# Patient Record
Sex: Female | Born: 1935 | Race: White | Hispanic: No | Smoking: Never smoker
Health system: Southern US, Community
[De-identification: ages and names within clinical notes are randomized; demographics above are authoritative.]

## PROBLEM LIST (undated history)

## (undated) DIAGNOSIS — F419 Anxiety disorder, unspecified: Secondary | ICD-10-CM

## (undated) DIAGNOSIS — R739 Hyperglycemia, unspecified: Secondary | ICD-10-CM

## (undated) DIAGNOSIS — I10 Essential (primary) hypertension: Secondary | ICD-10-CM

## (undated) DIAGNOSIS — M810 Age-related osteoporosis without current pathological fracture: Secondary | ICD-10-CM

## (undated) DIAGNOSIS — S72001A Fracture of unspecified part of neck of right femur, initial encounter for closed fracture: Secondary | ICD-10-CM

## (undated) HISTORY — PX: COLON SURGERY: SHX602

## (undated) HISTORY — PX: CHOLECYSTECTOMY: SHX55

---

## 1997-11-22 ENCOUNTER — Other Ambulatory Visit: Admission: RE | Admit: 1997-11-22 | Discharge: 1997-11-22 | Payer: Self-pay | Admitting: Family Medicine

## 1998-11-26 ENCOUNTER — Other Ambulatory Visit: Admission: RE | Admit: 1998-11-26 | Discharge: 1998-11-26 | Payer: Self-pay | Admitting: Family Medicine

## 1999-01-09 ENCOUNTER — Encounter: Payer: Self-pay | Admitting: General Surgery

## 1999-01-09 ENCOUNTER — Ambulatory Visit (HOSPITAL_COMMUNITY): Admission: RE | Admit: 1999-01-09 | Discharge: 1999-01-09 | Payer: Self-pay | Admitting: General Surgery

## 1999-01-25 ENCOUNTER — Encounter: Payer: Self-pay | Admitting: General Surgery

## 1999-01-28 ENCOUNTER — Ambulatory Visit (HOSPITAL_COMMUNITY): Admission: RE | Admit: 1999-01-28 | Discharge: 1999-01-29 | Payer: Self-pay | Admitting: General Surgery

## 1999-06-19 ENCOUNTER — Encounter (INDEPENDENT_AMBULATORY_CARE_PROVIDER_SITE_OTHER): Payer: Self-pay | Admitting: *Deleted

## 1999-06-19 ENCOUNTER — Ambulatory Visit (HOSPITAL_COMMUNITY): Admission: RE | Admit: 1999-06-19 | Discharge: 1999-06-19 | Payer: Self-pay | Admitting: Gastroenterology

## 1999-10-08 ENCOUNTER — Ambulatory Visit (HOSPITAL_COMMUNITY): Admission: RE | Admit: 1999-10-08 | Discharge: 1999-10-08 | Payer: Self-pay | Admitting: Gastroenterology

## 1999-10-08 ENCOUNTER — Encounter: Payer: Self-pay | Admitting: Gastroenterology

## 1999-11-27 ENCOUNTER — Other Ambulatory Visit: Admission: RE | Admit: 1999-11-27 | Discharge: 1999-11-27 | Payer: Self-pay | Admitting: *Deleted

## 2000-12-02 ENCOUNTER — Other Ambulatory Visit: Admission: RE | Admit: 2000-12-02 | Discharge: 2000-12-02 | Payer: Self-pay | Admitting: Family Medicine

## 2001-12-08 ENCOUNTER — Other Ambulatory Visit: Admission: RE | Admit: 2001-12-08 | Discharge: 2001-12-08 | Payer: Self-pay | Admitting: Family Medicine

## 2002-05-11 ENCOUNTER — Ambulatory Visit (HOSPITAL_BASED_OUTPATIENT_CLINIC_OR_DEPARTMENT_OTHER): Admission: RE | Admit: 2002-05-11 | Discharge: 2002-05-11 | Payer: Self-pay | Admitting: Plastic Surgery

## 2004-01-24 ENCOUNTER — Other Ambulatory Visit: Admission: RE | Admit: 2004-01-24 | Discharge: 2004-01-24 | Payer: Self-pay | Admitting: Family Medicine

## 2005-02-05 ENCOUNTER — Other Ambulatory Visit: Admission: RE | Admit: 2005-02-05 | Discharge: 2005-02-05 | Payer: Self-pay | Admitting: Family Medicine

## 2006-01-09 ENCOUNTER — Encounter (INDEPENDENT_AMBULATORY_CARE_PROVIDER_SITE_OTHER): Payer: Self-pay | Admitting: Specialist

## 2006-01-09 ENCOUNTER — Inpatient Hospital Stay (HOSPITAL_COMMUNITY): Admission: RE | Admit: 2006-01-09 | Discharge: 2006-01-15 | Payer: Self-pay

## 2007-11-28 ENCOUNTER — Emergency Department (HOSPITAL_COMMUNITY): Admission: EM | Admit: 2007-11-28 | Discharge: 2007-11-28 | Payer: Self-pay | Admitting: Emergency Medicine

## 2009-09-21 ENCOUNTER — Encounter: Admission: RE | Admit: 2009-09-21 | Discharge: 2009-09-21 | Payer: Self-pay | Admitting: *Deleted

## 2009-09-26 ENCOUNTER — Encounter: Admission: RE | Admit: 2009-09-26 | Discharge: 2009-09-26 | Payer: Self-pay | Admitting: *Deleted

## 2010-09-18 ENCOUNTER — Other Ambulatory Visit: Payer: Self-pay | Admitting: Dermatology

## 2010-11-29 NOTE — Op Note (Signed)
NAME:  Jessica Bruce, Jessica Bruce             ACCOUNT NO.:  [id number]   MEDICAL RECORD NO.:  [id number]          PATIENT TYPE:  INP   LOCATION:  0001                         FACILITY:  [facility]   PHYSICIAN:  Lebron Conners, M.D.   DATE OF BIRTH:  [birth year]   DATE OF PROCEDURE:  01/09/2006  DATE OF DISCHARGE:                                 OPERATIVE REPORT   PREOPERATIVE DIAGNOSIS:  Tumor of the rectosigmoid.   POSTOPERATIVE DIAGNOSIS:  Tumor of the rectosigmoid.   OPERATION:  Low anterior resection of the rectosigmoid with stapled  anastomosis.   SURGEON:  Lebron Conners, M.D.   ASSISTANT:  Ollen Gross. Carolynne Edouard, M.D.   ANESTHESIA:  General.   COMPLICATIONS:  None.   SPECIMEN:  Segment of colon.   BLOOD LOSS:  About 100 mL.   CONDITION:  To PACU:  Good.   PROCEDURE:  Following induction of anesthesia and insertion of a Foley  catheter and routine preparation and draping of the abdomen and peroneum  with the patient placed in stirrups, I made a midline lower abdominal  incision and entered the peritoneal cavity easily.  There was no evidence of  any peritoneal metastatic disease.  I examined the liver and it was normal  except for one tiny nodule on the surface of the left lobe, which on  examination, appeared to be cystic.  I felt the sigmoid colon distally and  could feel what felt like a soft tumor to me just above the peritoneal  reflection of the upper rectum.  I felt no other intra-abdominal  abnormalities except that I saw a small left ovarian cyst which appeared  totally benign.  This cyst was ruptured during the retraction and dissection  of the case.  I then packed away the small bowel and mobilized the sigmoid  colon, cutting a little bit of the attachment laterally of the distal  descending colon and then swinging the sigmoid colon medially.  I dissected  out and identified the left ureter and was sure that I did no harm to it.  I  divided the proximal area of resection with  cutting stapler at the mid  sigmoid and then dissected down through the mesentery toward the sacral  promontory and ligated and divided vessels using 2-0 silks and divided what  I thought was the inferior mesenteric artery.  I then scored the peritoneum  laterally on each side and took the dissection down along the anterior  surface of the sacrum and coccyx down into the pelvis, gaining good  mobilization.  I primarily used the harmonic scalpel for the dissection and  hemostasis after I had first divided the mesentery and ligated the vessels  with 2-0 silks.  I scored the peritoneum in the cul-de-sac, dissected right  down to the anterior surface of the bowel and I was sure I was distal to the  tumor.  I freed mesentery up all the way around and right up to the bowel  and then divided the bowel distally with the contour cutting stapler and  made a nice division.  I then opened the bowel and saw  that there was a  large soft tumor well proximal to the line of resection.  I pulled the  proximal bowel down and felt I needed a little bit more mobility and so I  dissected further along the left gutter and got excellent mobility without  dividing any more of the mesentery.  I again tried putting the bowel down in  the pelvis and it lay there without any tension at all.  Hemostasis was good  in the left gutter.  I opened the end of the bowel and utilized the sizers  and felt that I found that I could anastomose around 25 mm stapler, but it  would not accept a 29 mm.  I put a 2-0 Prolene pursestring suture and put  the anvil within the proximal bowel.  Dr. Carolynne Edouard went down and put the stapler  in through the anus and brought it up to the area of the distal staple line  under direct vision, guided the post through and connected the anvil and we  did an end-to-end anastomosis utilizing the round stapler.  With the stapler  removed, two nice complete rings of tissue, which we inspected and I sent  the  one from the distal bowel was the distal margin.  I then filled the  pelvis with fluid and Dr. Carolynne Edouard distended the bowel and there was no leak of  air.  He visualized the staple line and said it looked very good.  There was  no bleeding.  I then removed the irrigant and made sure of hemostasis in the  pelvis.  Sponge, needle and instrument counts were correct.  I closed the  fascia with running #1 PDS and I closed the skin with staples after  irrigating the subcutaneous tissues.  The patient was stable through the  procedure.      Lebron Conners, M.D.  Electronically Signed     WB/MEDQ  D:  01/09/2006  T:  01/09/2006  Job:  [id number]   cc:   Everardo All. Madilyn Fireman, M.D.  Fax: [phone]   Chales Salmon. Abigail Miyamoto, M.D.  Fax: [phone]

## 2010-11-29 NOTE — Discharge Summary (Signed)
NAME:  Jessica Bruce, Jessica Bruce             ACCOUNT NO.:  [id number]   MEDICAL RECORD NO.:  [id number]          PATIENT TYPE:  INP   LOCATION:  1611                         FACILITY:  [facility]   PHYSICIAN:  Lebron Conners, M.D.   DATE OF BIRTH:  1923-01-12   DATE OF ADMISSION:  01/09/2006  DATE OF DISCHARGE:  01/15/2006                                 DISCHARGE SUMMARY   HISTORY:  Ms. Boulay is a 77 year old female who was found on screening  colonoscopy to have a number of small polyps and a large polyp at 18 cm from  the anus.  It was not resectable and had high-grade dysplasia on biopsy.  She was referred for surgery.  She took an outpatient prep and did well with  it.  She was admitted for surgery.  Her only medical problem is  hypertension.  Examination was unremarkable.   HOSPITAL COURSE:  The patient underwent a low anterior resection of the  rectosigmoid on the admission date January 09, 2006.  Postoperatively, she had  no unusual problems and had rapid return of gastrointestinal function.  By  January 15, 2006, her bowels were moving.  She was eating well.  Wound was well-  healed and staples were removed.  She was sent home and asked to return to  the office in 2-3 weeks.  Pathology showed a tubulovillous adenoma with  focal high-grade dysplasia but no malignancy was identified.  There were 14  lymph nodes present and all were benign.   DIAGNOSES:  1.  Tubulovillous adenoma of the rectosigmoid.  2.  Hypertension.  3.  Type 2 diabetes.   OPERATION:  Low anterior resection of the rectosigmoid.   DISCHARGE CONDITION:  Improved.      Lebron Conners, M.D.  Electronically Signed     WB/MEDQ  D:  02/02/2006  T:  02/02/2006  Job:  [id number]   cc:   Chales Salmon. Abigail Miyamoto, M.D.  Fax: [phone]   Veva Holes, M.D.

## 2010-11-29 NOTE — Procedures (Signed)
[place]  Patient:    Jessica Bruce                       MRN: [id number] Proc. Date: 06/19/99 Adm. Date:  [id number] Attending:  Orland Mustard CC:         Chales Salmon. Abigail Miyamoto, M.D.                           Procedure Report  PROCEDURE:  Esophagogastroduodenoscopy and polypectomy.  ENDOSCOPIST:  Llana Aliment. Edwards, M.D.  MEDICATIONS:  Hurricaine spray and fentanyl 50 mcg, Versed 8 mg IV.  INDICATIONS:  The patient is a nice woman who has had a previous cholecystectomy and a normal hepatobiliary scan.  Upper GI showed gastric polyps.  This procedure is done as a followup to the abnormal upper GI to evaluate his gastric polyps.  INFORMED CONSENT:  The procedure had been explained to the patient and consent obtained.  DESCRIPTION OF PROCEDURE:  With the patient in the left lateral decubitus position, the Olympus video endoscope was inserted blindly into the esophagus and advanced under direct visualization. The stomach was entered, pylorus identified and passed. Duodenum including bulb and second portion unremarkable.  The scope was withdrawn back into the stomach, antrum and body were normal.  Behind the fundus were multiple small polyps, the largest approximately 0.75 cm, multiple small ones of 2-3 mm.  There were at least 10-20 polyps.  There were four or five that were 0.5 cm large.  These were all removed with a snare.  The majority were sucked through the scope, others were recovered with a basket and all sent to pathology in a single jar.  There was no continuous bleeding in either polypectomy sites. The scope was withdrawn and distal and proximal esophagus were normal.  The patient tolerated the procedure well and maintained on low-flow oxygen and pulse oximetry throughout the procedure with no obvious problem.  ASSESSMENT:  Multiple gastric polyps, removed.  PLAN:  We will check pathology, continue patient on Axid and see  back in the office in January. DD:  06/19/99 TD:  06/20/99 Job: [id number] UXL/[id number]

## 2011-03-06 ENCOUNTER — Other Ambulatory Visit: Payer: Self-pay | Admitting: Dermatology

## 2011-04-09 LAB — DIFFERENTIAL
Basophils Absolute: 0
Eosinophils Absolute: 0.2
Eosinophils Relative: 3
Lymphocytes Relative: 30
Neutrophils Relative %: 61

## 2011-04-09 LAB — POCT I-STAT, CHEM 8
BUN: 21
Hemoglobin: 13.9
Potassium: 4.4
Sodium: 136
TCO2: 32

## 2011-04-09 LAB — URINALYSIS, ROUTINE W REFLEX MICROSCOPIC
Protein, ur: NEGATIVE
Urobilinogen, UA: 0.2

## 2011-04-09 LAB — CBC
HCT: 41.2
Platelets: 148 — ABNORMAL LOW
RDW: 13

## 2011-04-09 LAB — URINE CULTURE

## 2011-04-09 LAB — URINE MICROSCOPIC-ADD ON

## 2011-07-21 DIAGNOSIS — M1990 Unspecified osteoarthritis, unspecified site: Secondary | ICD-10-CM | POA: Diagnosis not present

## 2011-07-21 DIAGNOSIS — I10 Essential (primary) hypertension: Secondary | ICD-10-CM | POA: Diagnosis not present

## 2011-07-23 DIAGNOSIS — I10 Essential (primary) hypertension: Secondary | ICD-10-CM | POA: Diagnosis not present

## 2011-07-23 DIAGNOSIS — M899 Disorder of bone, unspecified: Secondary | ICD-10-CM | POA: Diagnosis not present

## 2011-07-23 DIAGNOSIS — M949 Disorder of cartilage, unspecified: Secondary | ICD-10-CM | POA: Diagnosis not present

## 2011-07-30 DIAGNOSIS — H26499 Other secondary cataract, unspecified eye: Secondary | ICD-10-CM | POA: Diagnosis not present

## 2011-08-27 DIAGNOSIS — M949 Disorder of cartilage, unspecified: Secondary | ICD-10-CM | POA: Diagnosis not present

## 2011-08-27 DIAGNOSIS — M899 Disorder of bone, unspecified: Secondary | ICD-10-CM | POA: Diagnosis not present

## 2011-09-03 DIAGNOSIS — L570 Actinic keratosis: Secondary | ICD-10-CM | POA: Diagnosis not present

## 2011-09-03 DIAGNOSIS — L578 Other skin changes due to chronic exposure to nonionizing radiation: Secondary | ICD-10-CM | POA: Diagnosis not present

## 2011-09-03 DIAGNOSIS — L821 Other seborrheic keratosis: Secondary | ICD-10-CM | POA: Diagnosis not present

## 2011-09-03 DIAGNOSIS — D239 Other benign neoplasm of skin, unspecified: Secondary | ICD-10-CM | POA: Diagnosis not present

## 2011-11-05 DIAGNOSIS — E782 Mixed hyperlipidemia: Secondary | ICD-10-CM | POA: Diagnosis not present

## 2011-11-05 DIAGNOSIS — M810 Age-related osteoporosis without current pathological fracture: Secondary | ICD-10-CM | POA: Diagnosis not present

## 2011-11-05 DIAGNOSIS — Z1331 Encounter for screening for depression: Secondary | ICD-10-CM | POA: Diagnosis not present

## 2011-11-05 DIAGNOSIS — Z0000 Encounter for general adult medical examination without abnormal findings: Secondary | ICD-10-CM | POA: Diagnosis not present

## 2011-11-05 DIAGNOSIS — F411 Generalized anxiety disorder: Secondary | ICD-10-CM | POA: Diagnosis not present

## 2011-11-05 DIAGNOSIS — I10 Essential (primary) hypertension: Secondary | ICD-10-CM | POA: Diagnosis not present

## 2012-04-14 ENCOUNTER — Other Ambulatory Visit: Payer: Self-pay | Admitting: Dermatology

## 2012-04-14 DIAGNOSIS — D485 Neoplasm of uncertain behavior of skin: Secondary | ICD-10-CM | POA: Diagnosis not present

## 2012-04-14 DIAGNOSIS — L821 Other seborrheic keratosis: Secondary | ICD-10-CM | POA: Diagnosis not present

## 2012-04-14 DIAGNOSIS — C44721 Squamous cell carcinoma of skin of unspecified lower limb, including hip: Secondary | ICD-10-CM | POA: Diagnosis not present

## 2012-04-14 DIAGNOSIS — L820 Inflamed seborrheic keratosis: Secondary | ICD-10-CM | POA: Diagnosis not present

## 2012-05-05 DIAGNOSIS — R7309 Other abnormal glucose: Secondary | ICD-10-CM | POA: Diagnosis not present

## 2012-05-05 DIAGNOSIS — E782 Mixed hyperlipidemia: Secondary | ICD-10-CM | POA: Diagnosis not present

## 2012-05-05 DIAGNOSIS — Z23 Encounter for immunization: Secondary | ICD-10-CM | POA: Diagnosis not present

## 2012-05-05 DIAGNOSIS — M810 Age-related osteoporosis without current pathological fracture: Secondary | ICD-10-CM | POA: Diagnosis not present

## 2012-05-05 DIAGNOSIS — I10 Essential (primary) hypertension: Secondary | ICD-10-CM | POA: Diagnosis not present

## 2012-05-05 DIAGNOSIS — F411 Generalized anxiety disorder: Secondary | ICD-10-CM | POA: Diagnosis not present

## 2012-05-06 ENCOUNTER — Other Ambulatory Visit: Payer: Self-pay | Admitting: Dermatology

## 2012-05-06 DIAGNOSIS — C44721 Squamous cell carcinoma of skin of unspecified lower limb, including hip: Secondary | ICD-10-CM | POA: Diagnosis not present

## 2012-06-07 DIAGNOSIS — Z23 Encounter for immunization: Secondary | ICD-10-CM | POA: Diagnosis not present

## 2012-08-16 DIAGNOSIS — E1139 Type 2 diabetes mellitus with other diabetic ophthalmic complication: Secondary | ICD-10-CM | POA: Diagnosis not present

## 2012-09-08 DIAGNOSIS — L821 Other seborrheic keratosis: Secondary | ICD-10-CM | POA: Diagnosis not present

## 2012-09-08 DIAGNOSIS — Z85828 Personal history of other malignant neoplasm of skin: Secondary | ICD-10-CM | POA: Diagnosis not present

## 2012-09-08 DIAGNOSIS — L820 Inflamed seborrheic keratosis: Secondary | ICD-10-CM | POA: Diagnosis not present

## 2012-09-08 DIAGNOSIS — L819 Disorder of pigmentation, unspecified: Secondary | ICD-10-CM | POA: Diagnosis not present

## 2012-09-08 DIAGNOSIS — D1739 Benign lipomatous neoplasm of skin and subcutaneous tissue of other sites: Secondary | ICD-10-CM | POA: Diagnosis not present

## 2012-09-08 DIAGNOSIS — L570 Actinic keratosis: Secondary | ICD-10-CM | POA: Diagnosis not present

## 2012-09-08 DIAGNOSIS — L739 Follicular disorder, unspecified: Secondary | ICD-10-CM | POA: Diagnosis not present

## 2012-10-11 DIAGNOSIS — M5430 Sciatica, unspecified side: Secondary | ICD-10-CM | POA: Diagnosis not present

## 2012-10-14 DIAGNOSIS — Z85828 Personal history of other malignant neoplasm of skin: Secondary | ICD-10-CM | POA: Diagnosis not present

## 2012-10-14 DIAGNOSIS — L570 Actinic keratosis: Secondary | ICD-10-CM | POA: Diagnosis not present

## 2012-10-14 DIAGNOSIS — L820 Inflamed seborrheic keratosis: Secondary | ICD-10-CM | POA: Diagnosis not present

## 2012-12-31 DIAGNOSIS — Z1331 Encounter for screening for depression: Secondary | ICD-10-CM | POA: Diagnosis not present

## 2012-12-31 DIAGNOSIS — I10 Essential (primary) hypertension: Secondary | ICD-10-CM | POA: Diagnosis not present

## 2013-02-02 DIAGNOSIS — H6120 Impacted cerumen, unspecified ear: Secondary | ICD-10-CM | POA: Diagnosis not present

## 2013-03-28 DIAGNOSIS — R42 Dizziness and giddiness: Secondary | ICD-10-CM | POA: Diagnosis not present

## 2013-03-28 DIAGNOSIS — I10 Essential (primary) hypertension: Secondary | ICD-10-CM | POA: Diagnosis not present

## 2013-03-28 DIAGNOSIS — R000 Tachycardia, unspecified: Secondary | ICD-10-CM | POA: Diagnosis not present

## 2013-03-30 DIAGNOSIS — F411 Generalized anxiety disorder: Secondary | ICD-10-CM | POA: Diagnosis not present

## 2013-03-30 DIAGNOSIS — I10 Essential (primary) hypertension: Secondary | ICD-10-CM | POA: Diagnosis not present

## 2013-03-30 DIAGNOSIS — M810 Age-related osteoporosis without current pathological fracture: Secondary | ICD-10-CM | POA: Diagnosis not present

## 2013-04-06 DIAGNOSIS — I10 Essential (primary) hypertension: Secondary | ICD-10-CM | POA: Diagnosis not present

## 2013-04-11 DIAGNOSIS — I10 Essential (primary) hypertension: Secondary | ICD-10-CM | POA: Diagnosis not present

## 2013-04-11 DIAGNOSIS — F411 Generalized anxiety disorder: Secondary | ICD-10-CM | POA: Diagnosis not present

## 2013-04-20 DIAGNOSIS — Z23 Encounter for immunization: Secondary | ICD-10-CM | POA: Diagnosis not present

## 2013-08-03 DIAGNOSIS — E119 Type 2 diabetes mellitus without complications: Secondary | ICD-10-CM | POA: Diagnosis not present

## 2013-08-03 DIAGNOSIS — Z0100 Encounter for examination of eyes and vision without abnormal findings: Secondary | ICD-10-CM | POA: Diagnosis not present

## 2013-08-04 DIAGNOSIS — R7301 Impaired fasting glucose: Secondary | ICD-10-CM | POA: Diagnosis not present

## 2013-08-04 DIAGNOSIS — I10 Essential (primary) hypertension: Secondary | ICD-10-CM | POA: Diagnosis not present

## 2013-08-04 DIAGNOSIS — H6120 Impacted cerumen, unspecified ear: Secondary | ICD-10-CM | POA: Diagnosis not present

## 2013-08-04 DIAGNOSIS — R42 Dizziness and giddiness: Secondary | ICD-10-CM | POA: Diagnosis not present

## 2013-08-21 ENCOUNTER — Emergency Department (HOSPITAL_COMMUNITY): Payer: Medicare Other

## 2013-08-21 ENCOUNTER — Encounter (HOSPITAL_COMMUNITY): Payer: Self-pay | Admitting: Emergency Medicine

## 2013-08-21 ENCOUNTER — Emergency Department (HOSPITAL_COMMUNITY)
Admission: EM | Admit: 2013-08-21 | Discharge: 2013-08-21 | Disposition: A | Discharge status: Home / Self Care | Payer: Medicare Other | Attending: Emergency Medicine | Admitting: Emergency Medicine

## 2013-08-21 DIAGNOSIS — I10 Essential (primary) hypertension: Secondary | ICD-10-CM | POA: Diagnosis not present

## 2013-08-21 DIAGNOSIS — R112 Nausea with vomiting, unspecified: Secondary | ICD-10-CM

## 2013-08-21 DIAGNOSIS — J449 Chronic obstructive pulmonary disease, unspecified: Secondary | ICD-10-CM | POA: Diagnosis not present

## 2013-08-21 DIAGNOSIS — R6889 Other general symptoms and signs: Secondary | ICD-10-CM | POA: Diagnosis not present

## 2013-08-21 DIAGNOSIS — Z880 Allergy status to penicillin: Secondary | ICD-10-CM | POA: Insufficient documentation

## 2013-08-21 DIAGNOSIS — R197 Diarrhea, unspecified: Secondary | ICD-10-CM | POA: Insufficient documentation

## 2013-08-21 DIAGNOSIS — E860 Dehydration: Secondary | ICD-10-CM | POA: Diagnosis not present

## 2013-08-21 HISTORY — DX: Essential (primary) hypertension: I10

## 2013-08-21 LAB — CBC WITH DIFFERENTIAL/PLATELET
BASOS ABS: 0 10*3/uL (ref 0.0–0.1)
Basophils Relative: 0 % (ref 0–1)
EOS ABS: 0 10*3/uL (ref 0.0–0.7)
EOS PCT: 0 % (ref 0–5)
HEMATOCRIT: 44.3 % (ref 36.0–46.0)
Hemoglobin: 16 g/dL — ABNORMAL HIGH (ref 12.0–15.0)
LYMPHS PCT: 2 % — AB (ref 12–46)
Lymphs Abs: 0.3 10*3/uL — ABNORMAL LOW (ref 0.7–4.0)
MCH: 33.6 pg (ref 26.0–34.0)
MCHC: 36.1 g/dL — ABNORMAL HIGH (ref 30.0–36.0)
MCV: 93.1 fL (ref 78.0–100.0)
MONO ABS: 0.4 10*3/uL (ref 0.1–1.0)
Monocytes Relative: 3 % (ref 3–12)
Neutro Abs: 13.4 10*3/uL — ABNORMAL HIGH (ref 1.7–7.7)
Neutrophils Relative %: 95 % — ABNORMAL HIGH (ref 43–77)
PLATELETS: 162 10*3/uL (ref 150–400)
RBC: 4.76 MIL/uL (ref 3.87–5.11)
RDW: 12.8 % (ref 11.5–15.5)
WBC: 14.1 10*3/uL — ABNORMAL HIGH (ref 4.0–10.5)

## 2013-08-21 LAB — COMPREHENSIVE METABOLIC PANEL
ALT: 13 U/L (ref 0–35)
AST: 15 U/L (ref 0–37)
Albumin: 3.8 g/dL (ref 3.5–5.2)
Alkaline Phosphatase: 67 U/L (ref 39–117)
BUN: 22 mg/dL (ref 6–23)
CALCIUM: 9.7 mg/dL (ref 8.4–10.5)
CO2: 24 meq/L (ref 19–32)
CREATININE: 0.7 mg/dL (ref 0.50–1.10)
Chloride: 99 mEq/L (ref 96–112)
GFR, EST AFRICAN AMERICAN: 87 mL/min — AB (ref >90)
GFR, EST NON AFRICAN AMERICAN: 75 mL/min — AB (ref >90)
GLUCOSE: 168 mg/dL — AB (ref 70–99)
Potassium: 3.2 mEq/L — ABNORMAL LOW (ref 3.7–5.3)
SODIUM: 140 meq/L (ref 137–147)
TOTAL PROTEIN: 6.7 g/dL (ref 6.0–8.3)
Total Bilirubin: 1 mg/dL (ref 0.3–1.2)

## 2013-08-21 MED ORDER — ONDANSETRON 4 MG PO TBDP: 4.0000 mg | ORAL_TABLET | Freq: Three times a day (TID) | ORAL | Status: DC | PRN

## 2013-08-21 MED ORDER — SODIUM CHLORIDE 0.9 % IV BOLUS (SEPSIS)
1000.0000 mL | Freq: Once | INTRAVENOUS | Status: AC
  Administered 2013-08-21: 1000 mL via INTRAVENOUS

## 2013-08-21 MED ORDER — POTASSIUM CHLORIDE CRYS ER 20 MEQ PO TBCR
40.0000 meq | EXTENDED_RELEASE_TABLET | Freq: Once | ORAL | Status: AC
  Administered 2013-08-21: 40 meq via ORAL
  Filled 2013-08-21: qty 2

## 2013-08-21 NOTE — ED Provider Notes (Signed)
Medical screening examination/treatment/procedure(s) were conducted as a shared visit with non-physician practitioner(s) and myself.  I personally evaluated the patient during the encounter.  EKG Sinus arrhythmia rate 83 Poor R-wave progression Prolonged QT interval No ST elevation or depression No prior EKG for comparison (no MUSE EPIC hyperlink)  Patient presents to the emergency room with complaints of vomiting and diarrhea.  Other family members have been ill with a similar illness. Patient denies any abdominal pain she did have some discomfort in her upper abdomen when the symptoms first started. She denies any fevers.  On exam she has no abdominal tenderness. We'll plan on labs, IV fluids antinausea medications and monitor closely  Kathalene Frames, MD 08/23/13 1116

## 2013-08-21 NOTE — ED Notes (Signed)
Pt presents to department via [facility] for evaluation of N/V/D. Onset yesterday. Denies abdominal pain. States other family members have also been sick with similar symptoms. Pt is alert and oriented x4. 20g RAC. Received 4mg  zofran with relief per EMS.

## 2013-08-21 NOTE — ED Provider Notes (Signed)
CSN: [id number]     Arrival date & time 08/21/13  0263 History   First MD Initiated Contact with Patient 08/21/13 [phone]     Chief Complaint  Patient presents with  . Nausea  . Emesis  . Diarrhea   (Consider location/radiation/quality/duration/timing/severity/associated sxs/prior Treatment) Patient is a 80 y.o. female presenting with vomiting and diarrhea. The history is provided by the patient and medical records.  Emesis Associated symptoms: diarrhea   Diarrhea Associated symptoms: vomiting    This is an 80 y.o. F with PMH significant for HTN presenting to the ED for N/V/D for the last 24 hours.  States throughout the night she got out of bed nearly 20 times to go to the bathroom.  Denies any blood in stool or emesis. Pt states other members of her family have been sick with similar sx.  No fevers or chills.  Denies any abdominal pain, chest pain, SOB, palpitations, dizziness, weakness, or feelings of syncope.  Prior abdominal surgeries include cholecystectomy.  Pt given zofran en route by EMS with complete resolution of her nausea.  VS stable on arrival.  Past Medical History  Diagnosis Date  . Hypertension    History reviewed. No pertinent past surgical history. No family history on file. History  Substance Use Topics  . Smoking status: Never Smoker   . Smokeless tobacco: Not on file  . Alcohol Use: No   OB History   Grav Para Term Preterm Abortions TAB SAB Ect Mult Living                 Review of Systems  Gastrointestinal: Positive for nausea, vomiting and diarrhea.  All other systems reviewed and are negative.    Allergies  Penicillins  Home Medications  No current outpatient prescriptions on file. BP 129/57  Pulse 78  Temp(Src) 98.2 F (36.8 C) (Oral)  Resp 29  SpO2 98%  Physical Exam  Nursing note and vitals reviewed. Constitutional: She is oriented to person, place, and time. She appears well-developed and well-nourished. No distress.  HENT:  Head:  Normocephalic and atraumatic.  Mouth/Throat: Oropharynx is clear and moist. Mucous membranes are dry.  Dry Mucous membranes  Eyes: Conjunctivae and EOM are normal. Pupils are equal, round, and reactive to light.  Neck: Normal range of motion. Neck supple.  Cardiovascular: Normal rate, regular rhythm and normal heart sounds.   Pulmonary/Chest: Effort normal and breath sounds normal. No respiratory distress. She has no wheezes.  Abdominal: Soft. Bowel sounds are normal. There is no tenderness. There is no guarding, no tenderness at McBurney's point and negative Murphy's sign.  Abdomen soft, nondistended, no peritoneal signs  Musculoskeletal: Normal range of motion. She exhibits no edema.  Neurological: She is alert and oriented to person, place, and time.  Skin: Skin is warm and dry. She is not diaphoretic.  Psychiatric: She has a normal mood and affect.    ED Course  Procedures (including critical care time) Labs Review Labs Reviewed  CBC WITH DIFFERENTIAL - Abnormal; Notable for the following:    WBC 14.1 (*)    Hemoglobin 16.0 (*)    MCHC 36.1 (*)    Neutrophils Relative % 95 (*)    Neutro Abs 13.4 (*)    Lymphocytes Relative 2 (*)    Lymphs Abs 0.3 (*)    All other components within normal limits  COMPREHENSIVE METABOLIC PANEL - Abnormal; Notable for the following:    Potassium 3.2 (*)    Glucose, Bld 168 (*)  GFR calc non Af Amer 75 (*)    GFR calc Af Amer 87 (*)    All other components within normal limits   Imaging Review No results found.  EKG Interpretation   None       MDM   1. Nausea vomiting and diarrhea    Labs with mild leukocytosis of 14.1. Hypokalemia at 3.2-- replaced in the ED. Patient given IV fluids with good improvement of her symptoms.  She has remained afebrile, nontoxic-appearing, and has tolerated PO without recurrent vomiting. She's had no episodes of diarrhea while in the ED.  Repeat abdominal exam remains benign.  Sx likely related to viral  gastroenteritis given nature of sx and recent sick contacts.  Pt will be discharged with zofran, instructed to drink plenty of fluids, start with bland diet and progress as tolerated.  FU with PCP.  Signs or symptoms that warrant ED return including abdominal pain, bloody diarrhea or emesis, intractable vomiting, etc. were discussed with patient and daughter-- they knowledge understanding and agreed.  Larene Pickett, PA-C 08/21/13 1227

## 2013-08-21 NOTE — Discharge Instructions (Signed)
Take the prescribed medication as directed for nausea. Continue drinking plenty of fluids to keep herself hydrated. You may wish to start with a bland diet and progress as tolerated. Follow-up with your primary care physician if problems occur. Return to the ED for new or worsening symptoms-- abdominal pain, uncontrollable nausea/vomiting, bloody diarrhea, etc.

## 2013-08-23 NOTE — ED Provider Notes (Signed)
Medical screening examination/treatment/procedure(s) were performed by non-physician practitioner and as supervising physician I was immediately available for consultation/collaboration.    Kathalene Frames, MD 08/23/13 [phone]

## 2013-09-14 ENCOUNTER — Other Ambulatory Visit: Payer: Self-pay | Admitting: Dermatology

## 2013-09-14 DIAGNOSIS — D0460 Carcinoma in situ of skin of unspecified upper limb, including shoulder: Secondary | ICD-10-CM | POA: Diagnosis not present

## 2013-09-14 DIAGNOSIS — L819 Disorder of pigmentation, unspecified: Secondary | ICD-10-CM | POA: Diagnosis not present

## 2013-09-14 DIAGNOSIS — D1739 Benign lipomatous neoplasm of skin and subcutaneous tissue of other sites: Secondary | ICD-10-CM | POA: Diagnosis not present

## 2013-09-14 DIAGNOSIS — H6120 Impacted cerumen, unspecified ear: Secondary | ICD-10-CM | POA: Diagnosis not present

## 2013-09-14 DIAGNOSIS — L821 Other seborrheic keratosis: Secondary | ICD-10-CM | POA: Diagnosis not present

## 2013-09-14 DIAGNOSIS — L723 Sebaceous cyst: Secondary | ICD-10-CM | POA: Diagnosis not present

## 2013-09-14 DIAGNOSIS — D485 Neoplasm of uncertain behavior of skin: Secondary | ICD-10-CM | POA: Diagnosis not present

## 2013-09-14 DIAGNOSIS — Z85828 Personal history of other malignant neoplasm of skin: Secondary | ICD-10-CM | POA: Diagnosis not present

## 2013-09-21 DIAGNOSIS — Z85828 Personal history of other malignant neoplasm of skin: Secondary | ICD-10-CM | POA: Diagnosis not present

## 2013-09-21 DIAGNOSIS — D0460 Carcinoma in situ of skin of unspecified upper limb, including shoulder: Secondary | ICD-10-CM | POA: Diagnosis not present

## 2013-09-21 DIAGNOSIS — L821 Other seborrheic keratosis: Secondary | ICD-10-CM | POA: Diagnosis not present

## 2013-12-28 DIAGNOSIS — L299 Pruritus, unspecified: Secondary | ICD-10-CM | POA: Diagnosis not present

## 2013-12-28 DIAGNOSIS — Z85828 Personal history of other malignant neoplasm of skin: Secondary | ICD-10-CM | POA: Diagnosis not present

## 2013-12-28 DIAGNOSIS — L570 Actinic keratosis: Secondary | ICD-10-CM | POA: Diagnosis not present

## 2014-02-01 DIAGNOSIS — Z1331 Encounter for screening for depression: Secondary | ICD-10-CM | POA: Diagnosis not present

## 2014-02-01 DIAGNOSIS — I10 Essential (primary) hypertension: Secondary | ICD-10-CM | POA: Diagnosis not present

## 2014-02-01 DIAGNOSIS — N899 Noninflammatory disorder of vagina, unspecified: Secondary | ICD-10-CM | POA: Diagnosis not present

## 2014-03-01 DIAGNOSIS — R1310 Dysphagia, unspecified: Secondary | ICD-10-CM | POA: Diagnosis not present

## 2014-05-03 DIAGNOSIS — M810 Age-related osteoporosis without current pathological fracture: Secondary | ICD-10-CM | POA: Diagnosis not present

## 2014-05-03 DIAGNOSIS — Z029 Encounter for administrative examinations, unspecified: Secondary | ICD-10-CM | POA: Diagnosis not present

## 2014-05-03 DIAGNOSIS — I10 Essential (primary) hypertension: Secondary | ICD-10-CM | POA: Diagnosis not present

## 2014-05-03 DIAGNOSIS — Z23 Encounter for immunization: Secondary | ICD-10-CM | POA: Diagnosis not present

## 2014-05-03 DIAGNOSIS — Z111 Encounter for screening for respiratory tuberculosis: Secondary | ICD-10-CM | POA: Diagnosis not present

## 2014-06-19 DIAGNOSIS — I10 Essential (primary) hypertension: Secondary | ICD-10-CM | POA: Diagnosis not present

## 2014-06-19 DIAGNOSIS — R8299 Other abnormal findings in urine: Secondary | ICD-10-CM | POA: Diagnosis not present

## 2014-06-19 DIAGNOSIS — J069 Acute upper respiratory infection, unspecified: Secondary | ICD-10-CM | POA: Diagnosis not present

## 2014-06-23 DIAGNOSIS — Z1389 Encounter for screening for other disorder: Secondary | ICD-10-CM | POA: Diagnosis not present

## 2014-06-23 DIAGNOSIS — F418 Other specified anxiety disorders: Secondary | ICD-10-CM | POA: Diagnosis not present

## 2014-07-24 DIAGNOSIS — Z1389 Encounter for screening for other disorder: Secondary | ICD-10-CM | POA: Diagnosis not present

## 2014-07-24 DIAGNOSIS — F411 Generalized anxiety disorder: Secondary | ICD-10-CM | POA: Diagnosis not present

## 2014-08-02 DIAGNOSIS — E119 Type 2 diabetes mellitus without complications: Secondary | ICD-10-CM | POA: Diagnosis not present

## 2014-08-30 DIAGNOSIS — N898 Other specified noninflammatory disorders of vagina: Secondary | ICD-10-CM | POA: Diagnosis not present

## 2014-08-30 DIAGNOSIS — Z0000 Encounter for general adult medical examination without abnormal findings: Secondary | ICD-10-CM | POA: Diagnosis not present

## 2014-08-30 DIAGNOSIS — F411 Generalized anxiety disorder: Secondary | ICD-10-CM | POA: Diagnosis not present

## 2014-08-30 DIAGNOSIS — Z1389 Encounter for screening for other disorder: Secondary | ICD-10-CM | POA: Diagnosis not present

## 2014-08-30 DIAGNOSIS — R309 Painful micturition, unspecified: Secondary | ICD-10-CM | POA: Diagnosis not present

## 2014-09-27 DIAGNOSIS — L720 Epidermal cyst: Secondary | ICD-10-CM | POA: Diagnosis not present

## 2014-09-27 DIAGNOSIS — Z85828 Personal history of other malignant neoplasm of skin: Secondary | ICD-10-CM | POA: Diagnosis not present

## 2014-09-27 DIAGNOSIS — L821 Other seborrheic keratosis: Secondary | ICD-10-CM | POA: Diagnosis not present

## 2014-09-27 DIAGNOSIS — L820 Inflamed seborrheic keratosis: Secondary | ICD-10-CM | POA: Diagnosis not present

## 2014-09-27 DIAGNOSIS — L814 Other melanin hyperpigmentation: Secondary | ICD-10-CM | POA: Diagnosis not present

## 2014-09-27 DIAGNOSIS — L570 Actinic keratosis: Secondary | ICD-10-CM | POA: Diagnosis not present

## 2014-11-29 DIAGNOSIS — L821 Other seborrheic keratosis: Secondary | ICD-10-CM | POA: Diagnosis not present

## 2014-11-29 DIAGNOSIS — Z85828 Personal history of other malignant neoplasm of skin: Secondary | ICD-10-CM | POA: Diagnosis not present

## 2015-02-27 DIAGNOSIS — F411 Generalized anxiety disorder: Secondary | ICD-10-CM | POA: Diagnosis not present

## 2015-02-27 DIAGNOSIS — I10 Essential (primary) hypertension: Secondary | ICD-10-CM | POA: Diagnosis not present

## 2015-02-27 DIAGNOSIS — H6122 Impacted cerumen, left ear: Secondary | ICD-10-CM | POA: Diagnosis not present

## 2015-02-27 DIAGNOSIS — L819 Disorder of pigmentation, unspecified: Secondary | ICD-10-CM | POA: Diagnosis not present

## 2015-02-28 DIAGNOSIS — R739 Hyperglycemia, unspecified: Secondary | ICD-10-CM | POA: Diagnosis not present

## 2015-03-23 DIAGNOSIS — Z85828 Personal history of other malignant neoplasm of skin: Secondary | ICD-10-CM | POA: Diagnosis not present

## 2015-03-23 DIAGNOSIS — L570 Actinic keratosis: Secondary | ICD-10-CM | POA: Diagnosis not present

## 2015-03-23 DIAGNOSIS — D485 Neoplasm of uncertain behavior of skin: Secondary | ICD-10-CM | POA: Diagnosis not present

## 2015-03-23 DIAGNOSIS — L821 Other seborrheic keratosis: Secondary | ICD-10-CM | POA: Diagnosis not present

## 2015-08-06 DIAGNOSIS — E119 Type 2 diabetes mellitus without complications: Secondary | ICD-10-CM | POA: Diagnosis not present

## 2015-08-29 DIAGNOSIS — F411 Generalized anxiety disorder: Secondary | ICD-10-CM | POA: Diagnosis not present

## 2015-08-29 DIAGNOSIS — R7301 Impaired fasting glucose: Secondary | ICD-10-CM | POA: Diagnosis not present

## 2015-08-29 DIAGNOSIS — Z0000 Encounter for general adult medical examination without abnormal findings: Secondary | ICD-10-CM | POA: Diagnosis not present

## 2015-08-29 DIAGNOSIS — H6123 Impacted cerumen, bilateral: Secondary | ICD-10-CM | POA: Diagnosis not present

## 2015-08-29 DIAGNOSIS — I10 Essential (primary) hypertension: Secondary | ICD-10-CM | POA: Diagnosis not present

## 2015-08-29 DIAGNOSIS — Z1389 Encounter for screening for other disorder: Secondary | ICD-10-CM | POA: Diagnosis not present

## 2015-10-15 DIAGNOSIS — H903 Sensorineural hearing loss, bilateral: Secondary | ICD-10-CM | POA: Diagnosis not present

## 2015-11-19 DIAGNOSIS — D171 Benign lipomatous neoplasm of skin and subcutaneous tissue of trunk: Secondary | ICD-10-CM | POA: Diagnosis not present

## 2015-11-19 DIAGNOSIS — Z85828 Personal history of other malignant neoplasm of skin: Secondary | ICD-10-CM | POA: Diagnosis not present

## 2015-11-19 DIAGNOSIS — L570 Actinic keratosis: Secondary | ICD-10-CM | POA: Diagnosis not present

## 2015-11-19 DIAGNOSIS — L814 Other melanin hyperpigmentation: Secondary | ICD-10-CM | POA: Diagnosis not present

## 2015-11-19 DIAGNOSIS — D1723 Benign lipomatous neoplasm of skin and subcutaneous tissue of right leg: Secondary | ICD-10-CM | POA: Diagnosis not present

## 2015-11-19 DIAGNOSIS — L821 Other seborrheic keratosis: Secondary | ICD-10-CM | POA: Diagnosis not present

## 2015-11-19 DIAGNOSIS — L738 Other specified follicular disorders: Secondary | ICD-10-CM | POA: Diagnosis not present

## 2016-02-27 DIAGNOSIS — E785 Hyperlipidemia, unspecified: Secondary | ICD-10-CM | POA: Diagnosis not present

## 2016-02-27 DIAGNOSIS — R739 Hyperglycemia, unspecified: Secondary | ICD-10-CM | POA: Diagnosis not present

## 2016-02-27 DIAGNOSIS — Z23 Encounter for immunization: Secondary | ICD-10-CM | POA: Diagnosis not present

## 2016-02-27 DIAGNOSIS — Z6825 Body mass index (BMI) 25.0-25.9, adult: Secondary | ICD-10-CM | POA: Diagnosis not present

## 2016-02-27 DIAGNOSIS — M810 Age-related osteoporosis without current pathological fracture: Secondary | ICD-10-CM | POA: Diagnosis not present

## 2016-02-27 DIAGNOSIS — R8290 Unspecified abnormal findings in urine: Secondary | ICD-10-CM | POA: Diagnosis not present

## 2016-02-27 DIAGNOSIS — I10 Essential (primary) hypertension: Secondary | ICD-10-CM | POA: Diagnosis not present

## 2016-02-27 DIAGNOSIS — F411 Generalized anxiety disorder: Secondary | ICD-10-CM | POA: Diagnosis not present

## 2016-04-15 DIAGNOSIS — Z23 Encounter for immunization: Secondary | ICD-10-CM | POA: Diagnosis not present

## 2016-07-29 DIAGNOSIS — H26492 Other secondary cataract, left eye: Secondary | ICD-10-CM | POA: Diagnosis not present

## 2016-09-03 DIAGNOSIS — H9193 Unspecified hearing loss, bilateral: Secondary | ICD-10-CM | POA: Diagnosis not present

## 2016-09-03 DIAGNOSIS — M810 Age-related osteoporosis without current pathological fracture: Secondary | ICD-10-CM | POA: Diagnosis not present

## 2016-09-03 DIAGNOSIS — E785 Hyperlipidemia, unspecified: Secondary | ICD-10-CM | POA: Diagnosis not present

## 2016-09-03 DIAGNOSIS — K529 Noninfective gastroenteritis and colitis, unspecified: Secondary | ICD-10-CM | POA: Diagnosis not present

## 2016-09-03 DIAGNOSIS — F411 Generalized anxiety disorder: Secondary | ICD-10-CM | POA: Diagnosis not present

## 2016-09-03 DIAGNOSIS — I10 Essential (primary) hypertension: Secondary | ICD-10-CM | POA: Diagnosis not present

## 2016-09-03 DIAGNOSIS — Z1389 Encounter for screening for other disorder: Secondary | ICD-10-CM | POA: Diagnosis not present

## 2016-09-03 DIAGNOSIS — R739 Hyperglycemia, unspecified: Secondary | ICD-10-CM | POA: Diagnosis not present

## 2016-09-03 DIAGNOSIS — Z0000 Encounter for general adult medical examination without abnormal findings: Secondary | ICD-10-CM | POA: Diagnosis not present

## 2016-10-28 DIAGNOSIS — Z974 Presence of external hearing-aid: Secondary | ICD-10-CM | POA: Diagnosis not present

## 2016-10-28 DIAGNOSIS — H918X2 Other specified hearing loss, left ear: Secondary | ICD-10-CM | POA: Diagnosis not present

## 2016-10-28 DIAGNOSIS — H6121 Impacted cerumen, right ear: Secondary | ICD-10-CM | POA: Diagnosis not present

## 2016-10-28 DIAGNOSIS — Z7289 Other problems related to lifestyle: Secondary | ICD-10-CM | POA: Diagnosis not present

## 2016-10-28 DIAGNOSIS — H9191 Unspecified hearing loss, right ear: Secondary | ICD-10-CM | POA: Diagnosis not present

## 2016-10-30 DIAGNOSIS — J0100 Acute maxillary sinusitis, unspecified: Secondary | ICD-10-CM | POA: Diagnosis not present

## 2016-10-30 DIAGNOSIS — R05 Cough: Secondary | ICD-10-CM | POA: Diagnosis not present

## 2016-10-31 DIAGNOSIS — J0100 Acute maxillary sinusitis, unspecified: Secondary | ICD-10-CM | POA: Diagnosis not present

## 2016-10-31 DIAGNOSIS — T887XXA Unspecified adverse effect of drug or medicament, initial encounter: Secondary | ICD-10-CM | POA: Diagnosis not present

## 2016-10-31 DIAGNOSIS — I10 Essential (primary) hypertension: Secondary | ICD-10-CM | POA: Diagnosis not present

## 2016-11-01 ENCOUNTER — Encounter (HOSPITAL_COMMUNITY): Payer: Self-pay | Admitting: Emergency Medicine

## 2016-11-01 ENCOUNTER — Emergency Department (HOSPITAL_COMMUNITY): Payer: Medicare Other

## 2016-11-01 ENCOUNTER — Emergency Department (HOSPITAL_COMMUNITY)
Admission: EM | Admit: 2016-11-01 | Discharge: 2016-11-01 | Disposition: A | Discharge status: Home / Self Care | Payer: Medicare Other | Attending: Emergency Medicine | Admitting: Emergency Medicine

## 2016-11-01 DIAGNOSIS — R197 Diarrhea, unspecified: Secondary | ICD-10-CM | POA: Diagnosis not present

## 2016-11-01 DIAGNOSIS — E876 Hypokalemia: Secondary | ICD-10-CM | POA: Insufficient documentation

## 2016-11-01 DIAGNOSIS — I10 Essential (primary) hypertension: Secondary | ICD-10-CM | POA: Insufficient documentation

## 2016-11-01 DIAGNOSIS — R935 Abnormal findings on diagnostic imaging of other abdominal regions, including retroperitoneum: Secondary | ICD-10-CM | POA: Insufficient documentation

## 2016-11-01 DIAGNOSIS — R112 Nausea with vomiting, unspecified: Secondary | ICD-10-CM

## 2016-11-01 DIAGNOSIS — Z79899 Other long term (current) drug therapy: Secondary | ICD-10-CM | POA: Diagnosis not present

## 2016-11-01 DIAGNOSIS — R1084 Generalized abdominal pain: Secondary | ICD-10-CM | POA: Diagnosis not present

## 2016-11-01 DIAGNOSIS — R109 Unspecified abdominal pain: Secondary | ICD-10-CM | POA: Diagnosis not present

## 2016-11-01 HISTORY — DX: Anxiety disorder, unspecified: F41.9

## 2016-11-01 HISTORY — DX: Age-related osteoporosis without current pathological fracture: M81.0

## 2016-11-01 LAB — COMPREHENSIVE METABOLIC PANEL
ALT: 17 U/L (ref 14–54)
AST: 24 U/L (ref 15–41)
Albumin: 3.9 g/dL (ref 3.5–5.0)
Alkaline Phosphatase: 69 U/L (ref 38–126)
Anion gap: 10 (ref 5–15)
BILIRUBIN TOTAL: 1.1 mg/dL (ref 0.3–1.2)
BUN: 14 mg/dL (ref 6–20)
CO2: 24 mmol/L (ref 22–32)
Calcium: 9.6 mg/dL (ref 8.9–10.3)
Chloride: 96 mmol/L — ABNORMAL LOW (ref 101–111)
Creatinine, Ser: 0.8 mg/dL (ref 0.44–1.00)
Glucose, Bld: 128 mg/dL — ABNORMAL HIGH (ref 65–99)
Potassium: 3.2 mmol/L — ABNORMAL LOW (ref 3.5–5.1)
Sodium: 130 mmol/L — ABNORMAL LOW (ref 135–145)
TOTAL PROTEIN: 6.7 g/dL (ref 6.5–8.1)

## 2016-11-01 LAB — URINALYSIS, ROUTINE W REFLEX MICROSCOPIC
Bilirubin Urine: NEGATIVE
GLUCOSE, UA: NEGATIVE mg/dL
HGB URINE DIPSTICK: NEGATIVE
KETONES UR: NEGATIVE mg/dL
LEUKOCYTES UA: NEGATIVE
NITRITE: NEGATIVE
PROTEIN: NEGATIVE mg/dL
Specific Gravity, Urine: 1.02 (ref 1.005–1.030)
pH: 6 (ref 5.0–8.0)

## 2016-11-01 LAB — CBC
HEMATOCRIT: 45 % (ref 36.0–46.0)
Hemoglobin: 15.7 g/dL — ABNORMAL HIGH (ref 12.0–15.0)
MCH: 32.2 pg (ref 26.0–34.0)
MCHC: 34.9 g/dL (ref 30.0–36.0)
MCV: 92.2 fL (ref 78.0–100.0)
PLATELETS: 203 10*3/uL (ref 150–400)
RBC: 4.88 MIL/uL (ref 3.87–5.11)
RDW: 12.8 % (ref 11.5–15.5)
WBC: 6.1 10*3/uL (ref 4.0–10.5)

## 2016-11-01 LAB — TROPONIN I

## 2016-11-01 LAB — LIPASE, BLOOD: Lipase: 17 U/L (ref 11–51)

## 2016-11-01 MED ORDER — ONDANSETRON 4 MG PO TBDP: 4.0000 mg | ORAL_TABLET | Freq: Three times a day (TID) | ORAL | 0 refills | Status: DC | PRN

## 2016-11-01 MED ORDER — POTASSIUM CHLORIDE CRYS ER 20 MEQ PO TBCR
40.0000 meq | EXTENDED_RELEASE_TABLET | Freq: Once | ORAL | Status: AC
  Administered 2016-11-01: 40 meq via ORAL
  Filled 2016-11-01: qty 2

## 2016-11-01 MED ORDER — ONDANSETRON HCL 4 MG/2ML IJ SOLN
4.0000 mg | Freq: Once | INTRAMUSCULAR | Status: AC
  Administered 2016-11-01: 4 mg via INTRAVENOUS
  Filled 2016-11-01: qty 2

## 2016-11-01 MED ORDER — IOPAMIDOL (ISOVUE-300) INJECTION 61%
INTRAVENOUS | Status: AC
  Administered 2016-11-01: 100 mL
  Filled 2016-11-01: qty 100

## 2016-11-01 MED ORDER — SODIUM CHLORIDE 0.9 % IV BOLUS (SEPSIS)
500.0000 mL | Freq: Once | INTRAVENOUS | Status: AC
  Administered 2016-11-01: 500 mL via INTRAVENOUS

## 2016-11-01 NOTE — ED Notes (Signed)
Pt stable, understands discharge instructions, and reasons for return.   

## 2016-11-01 NOTE — ED Notes (Signed)
Pt transported to xray 

## 2016-11-01 NOTE — Discharge Instructions (Signed)
Take the prescribed medication as directed for nausea.  Recommend gentle diet for now, lots of oral hydration. Follow-up with your primary care doctor for any ongoing issues. Return to the ED for new or worsening symptoms.

## 2016-11-01 NOTE — ED Notes (Signed)
Patient transported to CT 

## 2016-11-01 NOTE — ED Triage Notes (Signed)
Pt presents to ED for assessment of nausea and vomiting x 4 days.  Patient also had URI symptoms, was diagnosed with a sinus infection and started on a z-pack.  Patient then began to have nausea and vomiting and stopped after 2 days.  Pt has had 4 episodes of diarrhea today and 1 episode of vomiting, witnessed by ED staff.

## 2016-11-01 NOTE — ED Provider Notes (Signed)
[facility] DEPT Provider Note   CSN: [id number] Arrival date & time: 11/01/16  1600     History   Chief Complaint Chief Complaint  Patient presents with  . Nausea  . Emesis    HPI Jessica Bruce is a 83 y.o. female.  The history is provided by the patient and medical records.    83 year old female with history of anxiety, hypertension, osteoporosis, presenting to the ED for nausea, vomiting, and diarrhea. Patient reports she was seen last week for upper respiratory symptoms at a local clinic and was started on azithromycin. She took 2 doses of the medication and began feeling very sick with nausea, vomiting, diarrhea. States she stopped the medication and followed up with her doctor the next day who told her she was likely allergic. She was started on Allegra and reports her respiratory symptoms (congestion mostly) have improved with this. States she continues to have nausea and vomiting as well as loose stools. States she has had 3 loose stools thus far today. States she continues to feel sick to her stomach, she did throw up upon arrival to the ED. States she has not had any solid food in several days. She tried to drink some orange juice today but threw that up to. She's not had any fever or chills. She does live at [facility] center, no noted sick contacts.  Denies abdominal pain, chest pain, or SOB.  States she does feel generally weak and somewhat dehydrated.  Past Medical History:  Diagnosis Date  . Anxiety   . Hypertension   . Osteoporosis     There are no active problems to display for this patient.   Past Surgical History:  Procedure Laterality Date  . CHOLECYSTECTOMY    . COLON SURGERY      OB History    No data available       Home Medications    Prior to Admission medications   Not on File    Family History History reviewed. No pertinent family history.  Social History Social History  Substance Use Topics  . Smoking status: Never  Smoker  . Smokeless tobacco: Never Used  . Alcohol use 0.6 oz/week    1 Glasses of wine per week     Allergies   Azithromycin and Penicillins   Review of Systems Review of Systems  Gastrointestinal: Positive for diarrhea, nausea and vomiting.  All other systems reviewed and are negative.    Physical Exam Updated Vital Signs Pulse 72   Temp 98.6 F (37 C) (Oral)   Resp 20   SpO2 95%   Physical Exam  Constitutional: She is oriented to person, place, and time. She appears well-developed and well-nourished.  HENT:  Head: Normocephalic and atraumatic.  Right Ear: Tympanic membrane, external ear and ear canal normal.  Left Ear: Tympanic membrane, external ear and ear canal normal.  Nose: Nose normal.  Mouth/Throat: Uvula is midline and oropharynx is clear and moist. Mucous membranes are dry.  Mildly dry mucous membranes  Eyes: Conjunctivae and EOM are normal. Pupils are equal, round, and reactive to light.  Neck: Normal range of motion.  Cardiovascular: Normal rate, regular rhythm and normal heart sounds.   Pulmonary/Chest: Effort normal and breath sounds normal. No respiratory distress. She has no wheezes.  Abdominal: Soft. Bowel sounds are normal. There is no tenderness.  Abdomen soft, nontender, no distention, bowel sounds are normal, no peritoneal signs, no guarding  Musculoskeletal: Normal range of motion.  Neurological: She is alert and  oriented to person, place, and time.  Skin: Skin is warm and dry.  Psychiatric: She has a normal mood and affect.  Nursing note and vitals reviewed.    ED Treatments / Results  Labs (all labs ordered are listed, but only abnormal results are displayed) Labs Reviewed  COMPREHENSIVE METABOLIC PANEL - Abnormal; Notable for the following:       Result Value   Sodium 130 (*)    Potassium 3.2 (*)    Chloride 96 (*)    Glucose, Bld 128 (*)    All other components within normal limits  CBC - Abnormal; Notable for the following:     Hemoglobin 15.7 (*)    All other components within normal limits  LIPASE, BLOOD  URINALYSIS, ROUTINE W REFLEX MICROSCOPIC  TROPONIN I    EKG  EKG Interpretation  Date/Time:  Saturday November 01 2016 17:39:07 EDT Ventricular Rate:  65 PR Interval:    QRS Duration: 88 QT Interval:  392 QTC Calculation: 408 R Axis:   -163 Text Interpretation:  Right and left arm electrode reversal, interpretation assumes no reversal Sinus rhythm No old tracing to compare No ischemia or infarct Confirmed by ISAACS MD, Lysbeth Galas [phone]) on 11/01/2016 6:18:38 PM       Radiology Ct Abdomen Pelvis W Contrast  Result Date: 11/01/2016 CLINICAL DATA:  83 y/o F; five days of nausea, vomiting, and diarrhea with diffuse pain. EXAM: CT ABDOMEN AND PELVIS WITH CONTRAST TECHNIQUE: Multidetector CT imaging of the abdomen and pelvis was performed using the standard protocol following bolus administration of intravenous contrast. CONTRAST:  150mL ISOVUE-300 IOPAMIDOL (ISOVUE-300) INJECTION 61% COMPARISON:  None. FINDINGS: Lower chest: Small hiatal hernia. Hepatobiliary: Multiple well-circumscribed fluid attenuating structures within the liver the largest in segment 5 measuring up to 32 mm compatible with cysts. No other focal liver lesion identified. Status post cholecystectomy. Dilatation of the common bile duct up to 14 mm. Pancreas: Unremarkable. No pancreatic ductal dilatation or surrounding inflammatory changes. Spleen: Normal in size without focal abnormality. Adrenals/Urinary Tract: Well-circumscribed fluid attenuating structures within the right kidney the largest in the upper pole arising exophytic lesion measuring 46 mm are compatible with cysts. No hydronephrosis. Normal bladder. Normal adrenal glands. Stomach/Bowel: Duodenum diverticulum anterior 2/3 portion of duodenum. No obstructive or inflammatory changes of the small or large bowel. Unremarkable stomach. Extensive pan colonic diverticulosis greatest within the cecum  and ascending colon. Patent rectosigmoid anastomosis. Vascular/Lymphatic: Aortic atherosclerosis. No enlarged abdominal or pelvic lymph nodes. Reproductive: Uterus and bilateral adnexa are unremarkable. Other: No abdominal wall hernia or abnormality. No abdominopelvic ascites. Musculoskeletal: No acute or significant osseous findings. IMPRESSION: 1. Common bile duct enlargement post cholecystectomy is probably compensatory. Recommend correlation with liver function test. 2. Liver and renal cysts. 3. Small hiatal hernia. 4. Duodenum diverticulum and extensive pancolonic diverticulosis. 5. Aortic atherosclerosis. 6. No additional finding as explanation for abdominal pain. Electronically Signed   By: Kristine Garbe M.D.   On: 11/01/2016 20:28   Dg Abd Acute W/chest  Result Date: 11/01/2016 CLINICAL DATA:  83 year old female with history of nausea, vomiting and diarrhea. EXAM: DG ABDOMEN ACUTE W/ 1V CHEST COMPARISON:  No priors. FINDINGS: Lung volumes are normal. No consolidative airspace disease. No pleural effusions. No pneumothorax. No pulmonary nodule or mass noted. Pulmonary vasculature and the cardiomediastinal silhouette are within normal limits. Atherosclerosis in the thoracic aorta. Surgical clips near the thoracic inlet, likely from prior thyroidectomy. Gas and stool are seen scattered throughout the colon extending to the level of  the distal rectum. No pathologic distension of small bowel is noted. No gross evidence of pneumoperitoneum. Surgical clips project over the right upper quadrant, potentially from prior cholecystectomy. Suture line also noted in the low anatomic pelvis, suggesting prior bowel resection. IMPRESSION: 1.  Nonobstructive bowel gas pattern. 2. No pneumoperitoneum. 3. No radiographic evidence of acute cardiopulmonary disease. 4. Aortic atherosclerosis. Electronically Signed   By: Vinnie Langton M.D.   On: 11/01/2016 17:14    Procedures Procedures (including critical  care time)  Medications Ordered in ED Medications  sodium chloride 0.9 % bolus 500 mL (0 mLs Intravenous Stopped 11/01/16 1814)  ondansetron (ZOFRAN) injection 4 mg (4 mg Intravenous Given 11/01/16 1650)  iopamidol (ISOVUE-300) 61 % injection (100 mLs  Contrast Given 11/01/16 1952)  potassium chloride SA (K-DUR,KLOR-CON) CR tablet 40 mEq (40 mEq Oral Given 11/01/16 2140)     Initial Impression / Assessment and Plan / ED Course  I have reviewed the triage vital signs and the nursing notes.  Pertinent labs & imaging results that were available during my care of the patient were reviewed by me and considered in my medical decision making (see chart for details).  83 y.o. F here with N/V/D after taking azithromycin.  Stopped the medication after 2 doses.  Reports emesis upon arrival to ED today.  She is afebrile, non-toxic.  Abdomen is soft and benign.  Does report hx of IBS and prior partial colectomy.  Will plan for labs, imaging.  Given IVF and zofran.  Screening lab work reassuring-- mild hypokalemia which is likely secondary to her vomiting and diarrhea.  This was replaced here.  Imaging studies negative for acute findings.  Patient feeling better after fluids and zofran.  Tolerating oral fluids without issue.  No stools here.  Doubt C. Diff.  VS remain stable.  Feel she is stable for discharge.  Will have her continue oral fluids at home.  Close follow-up with PCP.  Discussed plan with patient, she acknowledged understanding and agreed with plan of care.  Return precautions given for new or worsening symptoms.  Final Clinical Impressions(s) / ED Diagnoses   Final diagnoses:  Nausea vomiting and diarrhea  Hypokalemia    New Prescriptions Discharge Medication List as of 11/01/2016 10:10 PM    START taking these medications   Details  ondansetron (ZOFRAN ODT) 4 MG disintegrating tablet Take 1 tablet (4 mg total) by mouth every 8 (eight) hours as needed for nausea., Starting Sat 11/01/2016,  Print         Larene Pickett, PA-C 11/01/16 2308    Duffy Bruce, MD 11/02/16 [phone]

## 2016-11-03 ENCOUNTER — Encounter (HOSPITAL_COMMUNITY): Payer: Self-pay | Admitting: Emergency Medicine

## 2016-11-19 DIAGNOSIS — H903 Sensorineural hearing loss, bilateral: Secondary | ICD-10-CM | POA: Diagnosis not present

## 2017-01-12 DIAGNOSIS — R197 Diarrhea, unspecified: Secondary | ICD-10-CM | POA: Diagnosis not present

## 2017-01-13 DIAGNOSIS — R197 Diarrhea, unspecified: Secondary | ICD-10-CM | POA: Diagnosis not present

## 2017-01-27 DIAGNOSIS — L821 Other seborrheic keratosis: Secondary | ICD-10-CM | POA: Diagnosis not present

## 2017-01-27 DIAGNOSIS — L820 Inflamed seborrheic keratosis: Secondary | ICD-10-CM | POA: Diagnosis not present

## 2017-01-27 DIAGNOSIS — L814 Other melanin hyperpigmentation: Secondary | ICD-10-CM | POA: Diagnosis not present

## 2017-01-27 DIAGNOSIS — L723 Sebaceous cyst: Secondary | ICD-10-CM | POA: Diagnosis not present

## 2017-01-27 DIAGNOSIS — Z85828 Personal history of other malignant neoplasm of skin: Secondary | ICD-10-CM | POA: Diagnosis not present

## 2017-01-27 DIAGNOSIS — D2239 Melanocytic nevi of other parts of face: Secondary | ICD-10-CM | POA: Diagnosis not present

## 2017-03-04 DIAGNOSIS — R7301 Impaired fasting glucose: Secondary | ICD-10-CM | POA: Diagnosis not present

## 2017-03-04 DIAGNOSIS — I10 Essential (primary) hypertension: Secondary | ICD-10-CM | POA: Diagnosis not present

## 2017-03-04 DIAGNOSIS — E785 Hyperlipidemia, unspecified: Secondary | ICD-10-CM | POA: Diagnosis not present

## 2017-04-14 DIAGNOSIS — Z23 Encounter for immunization: Secondary | ICD-10-CM | POA: Diagnosis not present

## 2017-08-06 DIAGNOSIS — E119 Type 2 diabetes mellitus without complications: Secondary | ICD-10-CM | POA: Diagnosis not present

## 2017-08-25 DIAGNOSIS — L723 Sebaceous cyst: Secondary | ICD-10-CM | POA: Diagnosis not present

## 2017-09-04 DIAGNOSIS — Z0000 Encounter for general adult medical examination without abnormal findings: Secondary | ICD-10-CM | POA: Diagnosis not present

## 2017-09-04 DIAGNOSIS — Z1389 Encounter for screening for other disorder: Secondary | ICD-10-CM | POA: Diagnosis not present

## 2017-09-04 DIAGNOSIS — Z79899 Other long term (current) drug therapy: Secondary | ICD-10-CM | POA: Diagnosis not present

## 2017-09-04 DIAGNOSIS — I10 Essential (primary) hypertension: Secondary | ICD-10-CM | POA: Diagnosis not present

## 2017-09-21 DIAGNOSIS — H1013 Acute atopic conjunctivitis, bilateral: Secondary | ICD-10-CM | POA: Diagnosis not present

## 2018-03-01 DIAGNOSIS — F5101 Primary insomnia: Secondary | ICD-10-CM | POA: Diagnosis not present

## 2018-03-01 DIAGNOSIS — K591 Functional diarrhea: Secondary | ICD-10-CM | POA: Diagnosis not present

## 2018-03-01 DIAGNOSIS — R739 Hyperglycemia, unspecified: Secondary | ICD-10-CM | POA: Diagnosis not present

## 2018-03-01 DIAGNOSIS — R5383 Other fatigue: Secondary | ICD-10-CM | POA: Diagnosis not present

## 2018-03-01 DIAGNOSIS — I10 Essential (primary) hypertension: Secondary | ICD-10-CM | POA: Diagnosis not present

## 2018-03-01 DIAGNOSIS — Z79899 Other long term (current) drug therapy: Secondary | ICD-10-CM | POA: Diagnosis not present

## 2018-03-16 DIAGNOSIS — C4441 Basal cell carcinoma of skin of scalp and neck: Secondary | ICD-10-CM | POA: Diagnosis not present

## 2018-03-16 DIAGNOSIS — L738 Other specified follicular disorders: Secondary | ICD-10-CM | POA: Diagnosis not present

## 2018-03-16 DIAGNOSIS — Z85828 Personal history of other malignant neoplasm of skin: Secondary | ICD-10-CM | POA: Diagnosis not present

## 2018-03-16 DIAGNOSIS — L720 Epidermal cyst: Secondary | ICD-10-CM | POA: Diagnosis not present

## 2018-03-16 DIAGNOSIS — D485 Neoplasm of uncertain behavior of skin: Secondary | ICD-10-CM | POA: Diagnosis not present

## 2018-03-16 DIAGNOSIS — L814 Other melanin hyperpigmentation: Secondary | ICD-10-CM | POA: Diagnosis not present

## 2018-03-16 DIAGNOSIS — L821 Other seborrheic keratosis: Secondary | ICD-10-CM | POA: Diagnosis not present

## 2018-03-16 DIAGNOSIS — L570 Actinic keratosis: Secondary | ICD-10-CM | POA: Diagnosis not present

## 2018-03-16 DIAGNOSIS — L84 Corns and callosities: Secondary | ICD-10-CM | POA: Diagnosis not present

## 2018-03-16 DIAGNOSIS — L820 Inflamed seborrheic keratosis: Secondary | ICD-10-CM | POA: Diagnosis not present

## 2018-03-18 DIAGNOSIS — H0012 Chalazion right lower eyelid: Secondary | ICD-10-CM | POA: Diagnosis not present

## 2018-03-23 DIAGNOSIS — H0015 Chalazion left lower eyelid: Secondary | ICD-10-CM | POA: Diagnosis not present

## 2018-03-25 DIAGNOSIS — H00035 Abscess of left lower eyelid: Secondary | ICD-10-CM | POA: Diagnosis not present

## 2018-04-07 DIAGNOSIS — Z85828 Personal history of other malignant neoplasm of skin: Secondary | ICD-10-CM | POA: Diagnosis not present

## 2018-04-07 DIAGNOSIS — C4441 Basal cell carcinoma of skin of scalp and neck: Secondary | ICD-10-CM | POA: Diagnosis not present

## 2018-06-11 ENCOUNTER — Emergency Department (HOSPITAL_COMMUNITY): Payer: Medicare Other

## 2018-06-11 ENCOUNTER — Inpatient Hospital Stay (HOSPITAL_COMMUNITY): Payer: Medicare Other

## 2018-06-11 ENCOUNTER — Inpatient Hospital Stay (HOSPITAL_COMMUNITY): Payer: Medicare Other | Admitting: Certified Registered"

## 2018-06-11 ENCOUNTER — Encounter (HOSPITAL_COMMUNITY)
Admission: EM | Disposition: A | Discharge status: Rehabilitation Facility | Payer: Self-pay | Source: Home / Self Care | Attending: Student

## 2018-06-11 ENCOUNTER — Encounter (HOSPITAL_COMMUNITY): Payer: Self-pay | Admitting: Emergency Medicine

## 2018-06-11 ENCOUNTER — Inpatient Hospital Stay (HOSPITAL_COMMUNITY)
Admission: EM | Admit: 2018-06-11 | Discharge: 2018-06-15 | DRG: 470 | Disposition: A | Discharge status: Rehabilitation Facility | Payer: Medicare Other | Attending: Student | Admitting: Student

## 2018-06-11 DIAGNOSIS — Z79899 Other long term (current) drug therapy: Secondary | ICD-10-CM

## 2018-06-11 DIAGNOSIS — S40011A Contusion of right shoulder, initial encounter: Secondary | ICD-10-CM | POA: Diagnosis present

## 2018-06-11 DIAGNOSIS — R739 Hyperglycemia, unspecified: Secondary | ICD-10-CM | POA: Diagnosis not present

## 2018-06-11 DIAGNOSIS — D72829 Elevated white blood cell count, unspecified: Secondary | ICD-10-CM | POA: Diagnosis present

## 2018-06-11 DIAGNOSIS — M810 Age-related osteoporosis without current pathological fracture: Secondary | ICD-10-CM | POA: Diagnosis present

## 2018-06-11 DIAGNOSIS — G4700 Insomnia, unspecified: Secondary | ICD-10-CM | POA: Diagnosis present

## 2018-06-11 DIAGNOSIS — R402144 Coma scale, eyes open, spontaneous, 24 hours or more after hospital admission: Secondary | ICD-10-CM | POA: Diagnosis present

## 2018-06-11 DIAGNOSIS — E876 Hypokalemia: Secondary | ICD-10-CM | POA: Diagnosis present

## 2018-06-11 DIAGNOSIS — I491 Atrial premature depolarization: Secondary | ICD-10-CM | POA: Diagnosis present

## 2018-06-11 DIAGNOSIS — I10 Essential (primary) hypertension: Secondary | ICD-10-CM | POA: Diagnosis present

## 2018-06-11 DIAGNOSIS — R7989 Other specified abnormal findings of blood chemistry: Secondary | ICD-10-CM | POA: Diagnosis present

## 2018-06-11 DIAGNOSIS — R402364 Coma scale, best motor response, obeys commands, 24 hours or more after hospital admission: Secondary | ICD-10-CM | POA: Diagnosis present

## 2018-06-11 DIAGNOSIS — F439 Reaction to severe stress, unspecified: Secondary | ICD-10-CM | POA: Diagnosis present

## 2018-06-11 DIAGNOSIS — S72041A Displaced fracture of base of neck of right femur, initial encounter for closed fracture: Secondary | ICD-10-CM | POA: Diagnosis not present

## 2018-06-11 DIAGNOSIS — Z96641 Presence of right artificial hip joint: Secondary | ICD-10-CM | POA: Diagnosis not present

## 2018-06-11 DIAGNOSIS — S72001A Fracture of unspecified part of neck of right femur, initial encounter for closed fracture: Principal | ICD-10-CM

## 2018-06-11 DIAGNOSIS — S42034A Nondisplaced fracture of lateral end of right clavicle, initial encounter for closed fracture: Secondary | ICD-10-CM | POA: Diagnosis present

## 2018-06-11 DIAGNOSIS — Z880 Allergy status to penicillin: Secondary | ICD-10-CM

## 2018-06-11 DIAGNOSIS — R9431 Abnormal electrocardiogram [ECG] [EKG]: Secondary | ICD-10-CM | POA: Diagnosis present

## 2018-06-11 DIAGNOSIS — D62 Acute posthemorrhagic anemia: Secondary | ICD-10-CM

## 2018-06-11 DIAGNOSIS — Z471 Aftercare following joint replacement surgery: Secondary | ICD-10-CM | POA: Diagnosis not present

## 2018-06-11 DIAGNOSIS — S42034S Nondisplaced fracture of lateral end of right clavicle, sequela: Secondary | ICD-10-CM | POA: Diagnosis not present

## 2018-06-11 DIAGNOSIS — K5903 Drug induced constipation: Secondary | ICD-10-CM | POA: Diagnosis not present

## 2018-06-11 DIAGNOSIS — S42001A Fracture of unspecified part of right clavicle, initial encounter for closed fracture: Secondary | ICD-10-CM | POA: Diagnosis present

## 2018-06-11 DIAGNOSIS — S0990XA Unspecified injury of head, initial encounter: Secondary | ICD-10-CM | POA: Diagnosis not present

## 2018-06-11 DIAGNOSIS — S72001S Fracture of unspecified part of neck of right femur, sequela: Secondary | ICD-10-CM | POA: Diagnosis not present

## 2018-06-11 DIAGNOSIS — D696 Thrombocytopenia, unspecified: Secondary | ICD-10-CM | POA: Diagnosis present

## 2018-06-11 DIAGNOSIS — Z881 Allergy status to other antibiotic agents status: Secondary | ICD-10-CM

## 2018-06-11 DIAGNOSIS — D649 Anemia, unspecified: Secondary | ICD-10-CM | POA: Diagnosis not present

## 2018-06-11 DIAGNOSIS — Z419 Encounter for procedure for purposes other than remedying health state, unspecified: Secondary | ICD-10-CM

## 2018-06-11 DIAGNOSIS — R402254 Coma scale, best verbal response, oriented, 24 hours or more after hospital admission: Secondary | ICD-10-CM | POA: Diagnosis present

## 2018-06-11 DIAGNOSIS — N179 Acute kidney failure, unspecified: Secondary | ICD-10-CM | POA: Diagnosis not present

## 2018-06-11 DIAGNOSIS — F419 Anxiety disorder, unspecified: Secondary | ICD-10-CM | POA: Diagnosis present

## 2018-06-11 DIAGNOSIS — S42031A Displaced fracture of lateral end of right clavicle, initial encounter for closed fracture: Secondary | ICD-10-CM | POA: Diagnosis not present

## 2018-06-11 DIAGNOSIS — E46 Unspecified protein-calorie malnutrition: Secondary | ICD-10-CM | POA: Diagnosis not present

## 2018-06-11 DIAGNOSIS — W109XXA Fall (on) (from) unspecified stairs and steps, initial encounter: Secondary | ICD-10-CM | POA: Diagnosis present

## 2018-06-11 DIAGNOSIS — S199XXA Unspecified injury of neck, initial encounter: Secondary | ICD-10-CM | POA: Diagnosis not present

## 2018-06-11 DIAGNOSIS — Z66 Do not resuscitate: Secondary | ICD-10-CM | POA: Diagnosis present

## 2018-06-11 DIAGNOSIS — S42001S Fracture of unspecified part of right clavicle, sequela: Secondary | ICD-10-CM | POA: Diagnosis not present

## 2018-06-11 DIAGNOSIS — E785 Hyperlipidemia, unspecified: Secondary | ICD-10-CM | POA: Diagnosis present

## 2018-06-11 DIAGNOSIS — S72001D Fracture of unspecified part of neck of right femur, subsequent encounter for closed fracture with routine healing: Secondary | ICD-10-CM | POA: Diagnosis not present

## 2018-06-11 DIAGNOSIS — W19XXXA Unspecified fall, initial encounter: Secondary | ICD-10-CM

## 2018-06-11 DIAGNOSIS — K5900 Constipation, unspecified: Secondary | ICD-10-CM | POA: Diagnosis not present

## 2018-06-11 DIAGNOSIS — G8918 Other acute postprocedural pain: Secondary | ICD-10-CM | POA: Diagnosis not present

## 2018-06-11 DIAGNOSIS — E871 Hypo-osmolality and hyponatremia: Secondary | ICD-10-CM

## 2018-06-11 HISTORY — DX: Fracture of unspecified part of neck of right femur, initial encounter for closed fracture: S72.001A

## 2018-06-11 HISTORY — PX: ANTERIOR APPROACH HEMI HIP ARTHROPLASTY: SHX6690

## 2018-06-11 HISTORY — DX: Hyperglycemia, unspecified: R73.9

## 2018-06-11 LAB — BASIC METABOLIC PANEL
Anion gap: 13 (ref 5–15)
BUN: 18 mg/dL (ref 8–23)
CO2: 23 mmol/L (ref 22–32)
Calcium: 10.2 mg/dL (ref 8.9–10.3)
Chloride: 98 mmol/L (ref 98–111)
Creatinine, Ser: 0.81 mg/dL (ref 0.44–1.00)
GFR calc Af Amer: >60 mL/min (ref >60)
GFR calc non Af Amer: >60 mL/min (ref >60)
GLUCOSE: 115 mg/dL — AB (ref 70–99)
Potassium: 3.4 mmol/L — ABNORMAL LOW (ref 3.5–5.1)
Sodium: 134 mmol/L — ABNORMAL LOW (ref 135–145)

## 2018-06-11 LAB — ABO/RH: ABO/RH(D): O POS

## 2018-06-11 LAB — CBC
HCT: 46.2 % — ABNORMAL HIGH (ref 36.0–46.0)
Hemoglobin: 15.4 g/dL — ABNORMAL HIGH (ref 12.0–15.0)
MCH: 31.8 pg (ref 26.0–34.0)
MCHC: 33.3 g/dL (ref 30.0–36.0)
MCV: 95.3 fL (ref 80.0–100.0)
Platelets: 181 10*3/uL (ref 150–400)
RBC: 4.85 MIL/uL (ref 3.87–5.11)
RDW: 12.7 % (ref 11.5–15.5)
WBC: 15.1 10*3/uL — ABNORMAL HIGH (ref 4.0–10.5)
nRBC: 0 % (ref 0.0–0.2)

## 2018-06-11 LAB — TYPE AND SCREEN
ABO/RH(D): O POS
Antibody Screen: NEGATIVE

## 2018-06-11 LAB — PROTIME-INR
INR: 0.94
Prothrombin Time: 12.5 seconds (ref 11.4–15.2)

## 2018-06-11 SURGERY — HEMIARTHROPLASTY, HIP, DIRECT ANTERIOR APPROACH, FOR FRACTURE
Anesthesia: General | Site: Hip | Laterality: Right

## 2018-06-11 MED ORDER — ONDANSETRON HCL 4 MG/2ML IJ SOLN
INTRAMUSCULAR | Status: AC
  Filled 2018-06-11: qty 2

## 2018-06-11 MED ORDER — LIDOCAINE 2% (20 MG/ML) 5 ML SYRINGE
INTRAMUSCULAR | Status: DC | PRN
  Administered 2018-06-11 (×2): 50 mg via INTRAVENOUS

## 2018-06-11 MED ORDER — TRAMADOL HCL 50 MG PO TABS
50.0000 mg | ORAL_TABLET | Freq: Four times a day (QID) | ORAL | Status: DC
  Administered 2018-06-11 – 2018-06-15 (×15): 50 mg via ORAL
  Filled 2018-06-11 (×15): qty 1

## 2018-06-11 MED ORDER — DEXAMETHASONE SODIUM PHOSPHATE 10 MG/ML IJ SOLN
INTRAMUSCULAR | Status: DC | PRN
  Administered 2018-06-11: 5 mg via INTRAVENOUS

## 2018-06-11 MED ORDER — PROPOFOL 10 MG/ML IV BOLUS
INTRAVENOUS | Status: AC
  Filled 2018-06-11: qty 20

## 2018-06-11 MED ORDER — LIDOCAINE 2% (20 MG/ML) 5 ML SYRINGE
INTRAMUSCULAR | Status: AC
  Filled 2018-06-11: qty 5

## 2018-06-11 MED ORDER — ALUM & MAG HYDROXIDE-SIMETH 200-200-20 MG/5ML PO SUSP: 30.0000 mL | ORAL | Status: DC | PRN

## 2018-06-11 MED ORDER — ACETAMINOPHEN 325 MG PO TABS
325.0000 mg | ORAL_TABLET | Freq: Four times a day (QID) | ORAL | Status: DC | PRN
  Administered 2018-06-12 – 2018-06-13 (×4): 650 mg via ORAL
  Filled 2018-06-11 (×4): qty 2

## 2018-06-11 MED ORDER — LACTATED RINGERS IV SOLN
INTRAVENOUS | Status: DC | PRN
  Administered 2018-06-11: 17:00:00 via INTRAVENOUS

## 2018-06-11 MED ORDER — METOCLOPRAMIDE HCL 5 MG PO TABS: 5.0000 mg | ORAL_TABLET | Freq: Three times a day (TID) | ORAL | Status: DC | PRN

## 2018-06-11 MED ORDER — ONDANSETRON HCL 4 MG PO TABS: 4.0000 mg | ORAL_TABLET | Freq: Four times a day (QID) | ORAL | Status: DC | PRN

## 2018-06-11 MED ORDER — HYDROCHLOROTHIAZIDE 25 MG PO TABS
25.0000 mg | ORAL_TABLET | Freq: Every day | ORAL | Status: DC
  Administered 2018-06-12 – 2018-06-15 (×4): 25 mg via ORAL
  Filled 2018-06-11 (×4): qty 1

## 2018-06-11 MED ORDER — PHENYLEPHRINE HCL 10 MG/ML IJ SOLN
INTRAMUSCULAR | Status: DC | PRN
  Administered 2018-06-11: 80 ug via INTRAVENOUS

## 2018-06-11 MED ORDER — METOCLOPRAMIDE HCL 5 MG/ML IJ SOLN: 5.0000 mg | Freq: Three times a day (TID) | INTRAMUSCULAR | Status: DC | PRN

## 2018-06-11 MED ORDER — MORPHINE SULFATE (PF) 2 MG/ML IV SOLN: 0.5000 mg | INTRAVENOUS | Status: DC | PRN

## 2018-06-11 MED ORDER — MORPHINE SULFATE (PF) 4 MG/ML IV SOLN
4.0000 mg | INTRAVENOUS | Status: DC | PRN
  Administered 2018-06-11: 4 mg via INTRAVENOUS
  Filled 2018-06-11: qty 1

## 2018-06-11 MED ORDER — ONDANSETRON HCL 4 MG/2ML IJ SOLN
INTRAMUSCULAR | Status: DC | PRN
  Administered 2018-06-11: 4 mg via INTRAVENOUS

## 2018-06-11 MED ORDER — DOCUSATE SODIUM 100 MG PO CAPS
100.0000 mg | ORAL_CAPSULE | Freq: Two times a day (BID) | ORAL | Status: DC
  Administered 2018-06-11 – 2018-06-15 (×5): 100 mg via ORAL
  Filled 2018-06-11 (×6): qty 1

## 2018-06-11 MED ORDER — CEFAZOLIN SODIUM-DEXTROSE 2-4 GM/100ML-% IV SOLN
2.0000 g | Freq: Once | INTRAVENOUS | Status: AC
  Administered 2018-06-11: 2 g via INTRAVENOUS

## 2018-06-11 MED ORDER — METHOCARBAMOL 1000 MG/10ML IJ SOLN
500.0000 mg | Freq: Four times a day (QID) | INTRAVENOUS | Status: DC | PRN
  Filled 2018-06-11: qty 5

## 2018-06-11 MED ORDER — DEXAMETHASONE SODIUM PHOSPHATE 10 MG/ML IJ SOLN
INTRAMUSCULAR | Status: AC
  Filled 2018-06-11: qty 1

## 2018-06-11 MED ORDER — HYDROCODONE-ACETAMINOPHEN 5-325 MG PO TABS: 1.0000 | ORAL_TABLET | ORAL | Status: DC | PRN

## 2018-06-11 MED ORDER — MORPHINE SULFATE (PF) 2 MG/ML IV SOLN
2.0000 mg | Freq: Once | INTRAVENOUS | Status: AC
  Administered 2018-06-11: 2 mg via INTRAVENOUS
  Filled 2018-06-11: qty 1

## 2018-06-11 MED ORDER — FENTANYL CITRATE (PF) 100 MCG/2ML IJ SOLN: 25.0000 ug | INTRAMUSCULAR | Status: DC | PRN

## 2018-06-11 MED ORDER — CARVEDILOL 3.125 MG PO TABS: 3.1250 mg | ORAL_TABLET | Freq: Two times a day (BID) | ORAL | Status: DC

## 2018-06-11 MED ORDER — ESCITALOPRAM OXALATE 10 MG PO TABS
5.0000 mg | ORAL_TABLET | Freq: Every day | ORAL | Status: DC
  Administered 2018-06-11 – 2018-06-14 (×4): 5 mg via ORAL
  Filled 2018-06-11 (×4): qty 1

## 2018-06-11 MED ORDER — ONDANSETRON HCL 4 MG/2ML IJ SOLN
4.0000 mg | Freq: Once | INTRAMUSCULAR | Status: AC
  Administered 2018-06-11: 4 mg via INTRAVENOUS
  Filled 2018-06-11: qty 2

## 2018-06-11 MED ORDER — FENTANYL CITRATE (PF) 100 MCG/2ML IJ SOLN
INTRAMUSCULAR | Status: DC | PRN
  Administered 2018-06-11: 25 ug via INTRAVENOUS
  Administered 2018-06-11: 50 ug via INTRAVENOUS
  Administered 2018-06-11: 75 ug via INTRAVENOUS

## 2018-06-11 MED ORDER — ASPIRIN EC 325 MG PO TBEC
325.0000 mg | DELAYED_RELEASE_TABLET | Freq: Every day | ORAL | Status: DC
  Administered 2018-06-12 – 2018-06-15 (×4): 325 mg via ORAL
  Filled 2018-06-11 (×4): qty 1

## 2018-06-11 MED ORDER — SODIUM CHLORIDE 0.9 % IV SOLN
INTRAVENOUS | Status: DC | PRN
  Administered 2018-06-11: 30 ug/min via INTRAVENOUS

## 2018-06-11 MED ORDER — HYDROCODONE-ACETAMINOPHEN 5-325 MG PO TABS
1.0000 | ORAL_TABLET | Freq: Four times a day (QID) | ORAL | Status: DC | PRN
  Filled 2018-06-11 (×2): qty 1

## 2018-06-11 MED ORDER — PROPOFOL 10 MG/ML IV BOLUS
INTRAVENOUS | Status: DC | PRN
  Administered 2018-06-11: 90 mg via INTRAVENOUS

## 2018-06-11 MED ORDER — POLYETHYLENE GLYCOL 3350 17 G PO PACK
17.0000 g | PACK | Freq: Every day | ORAL | Status: DC | PRN
  Administered 2018-06-14 – 2018-06-15 (×2): 17 g via ORAL
  Filled 2018-06-11 (×2): qty 1

## 2018-06-11 MED ORDER — ONDANSETRON HCL 4 MG/2ML IJ SOLN: 4.0000 mg | Freq: Four times a day (QID) | INTRAMUSCULAR | Status: DC | PRN

## 2018-06-11 MED ORDER — HYDROCODONE-ACETAMINOPHEN 7.5-325 MG PO TABS: 1.0000 | ORAL_TABLET | ORAL | Status: DC | PRN

## 2018-06-11 MED ORDER — AMLODIPINE BESYLATE 10 MG PO TABS
10.0000 mg | ORAL_TABLET | Freq: Every day | ORAL | Status: DC
  Administered 2018-06-12 – 2018-06-15 (×4): 10 mg via ORAL
  Filled 2018-06-11 (×4): qty 1

## 2018-06-11 MED ORDER — HYDRALAZINE HCL 10 MG PO TABS
10.0000 mg | ORAL_TABLET | Freq: Two times a day (BID) | ORAL | Status: DC
  Administered 2018-06-11 – 2018-06-15 (×8): 10 mg via ORAL
  Filled 2018-06-11 (×8): qty 1

## 2018-06-11 MED ORDER — LOSARTAN POTASSIUM-HCTZ 100-25 MG PO TABS: 1.0000 | ORAL_TABLET | Freq: Every day | ORAL | Status: DC

## 2018-06-11 MED ORDER — PHENOL 1.4 % MT LIQD: 1.0000 | OROMUCOSAL | Status: DC | PRN

## 2018-06-11 MED ORDER — SODIUM CHLORIDE 0.9 % IR SOLN
Status: DC | PRN
  Administered 2018-06-11: 3000 mL

## 2018-06-11 MED ORDER — MENTHOL 3 MG MT LOZG: 1.0000 | LOZENGE | OROMUCOSAL | Status: DC | PRN

## 2018-06-11 MED ORDER — SUGAMMADEX SODIUM 200 MG/2ML IV SOLN
INTRAVENOUS | Status: DC | PRN
  Administered 2018-06-11: 200 mg via INTRAVENOUS

## 2018-06-11 MED ORDER — METHOCARBAMOL 1000 MG/10ML IJ SOLN: 500.0000 mg | Freq: Four times a day (QID) | INTRAVENOUS | Status: DC | PRN

## 2018-06-11 MED ORDER — ACETAMINOPHEN 10 MG/ML IV SOLN
INTRAVENOUS | Status: AC
  Filled 2018-06-11: qty 100

## 2018-06-11 MED ORDER — METHOCARBAMOL 500 MG PO TABS: 500.0000 mg | ORAL_TABLET | Freq: Four times a day (QID) | ORAL | Status: DC | PRN

## 2018-06-11 MED ORDER — ROCURONIUM BROMIDE 10 MG/ML (PF) SYRINGE
PREFILLED_SYRINGE | INTRAVENOUS | Status: DC | PRN
  Administered 2018-06-11: 10 mg via INTRAVENOUS
  Administered 2018-06-11: 40 mg via INTRAVENOUS

## 2018-06-11 MED ORDER — 0.9 % SODIUM CHLORIDE (POUR BTL) OPTIME
TOPICAL | Status: DC | PRN
  Administered 2018-06-11: 1000 mL

## 2018-06-11 MED ORDER — ACETAMINOPHEN 10 MG/ML IV SOLN
INTRAVENOUS | Status: DC | PRN
  Administered 2018-06-11: 1000 mg via INTRAVENOUS

## 2018-06-11 MED ORDER — LACTATED RINGERS IV SOLN
INTRAVENOUS | Status: DC
  Administered 2018-06-12: via INTRAVENOUS

## 2018-06-11 MED ORDER — PANTOPRAZOLE SODIUM 40 MG PO TBEC
40.0000 mg | DELAYED_RELEASE_TABLET | Freq: Every day | ORAL | Status: DC
  Administered 2018-06-11 – 2018-06-15 (×4): 40 mg via ORAL
  Filled 2018-06-11 (×4): qty 1

## 2018-06-11 MED ORDER — FENTANYL CITRATE (PF) 250 MCG/5ML IJ SOLN
INTRAMUSCULAR | Status: AC
  Filled 2018-06-11: qty 5

## 2018-06-11 MED ORDER — LOSARTAN POTASSIUM 50 MG PO TABS: 100.0000 mg | ORAL_TABLET | Freq: Every day | ORAL | Status: DC

## 2018-06-11 MED ORDER — CEFAZOLIN SODIUM-DEXTROSE 2-4 GM/100ML-% IV SOLN
2.0000 g | Freq: Four times a day (QID) | INTRAVENOUS | Status: AC
  Administered 2018-06-12 (×2): 2 g via INTRAVENOUS
  Filled 2018-06-11 (×2): qty 100

## 2018-06-11 MED ORDER — ROCURONIUM BROMIDE 50 MG/5ML IV SOSY
PREFILLED_SYRINGE | INTRAVENOUS | Status: AC
  Filled 2018-06-11: qty 5

## 2018-06-11 MED ORDER — DOCUSATE SODIUM 100 MG PO CAPS
100.0000 mg | ORAL_CAPSULE | Freq: Two times a day (BID) | ORAL | Status: DC
  Administered 2018-06-12 – 2018-06-15 (×6): 100 mg via ORAL
  Filled 2018-06-11 (×5): qty 1

## 2018-06-11 MED ORDER — CEFAZOLIN SODIUM-DEXTROSE 2-4 GM/100ML-% IV SOLN
INTRAVENOUS | Status: AC
  Filled 2018-06-11: qty 100

## 2018-06-11 SURGICAL SUPPLY — 57 items
APL SKNCLS STERI-STRIP NONHPOA (GAUZE/BANDAGES/DRESSINGS) ×1
BENZOIN TINCTURE PRP APPL 2/3 (GAUZE/BANDAGES/DRESSINGS) ×3 IMPLANT
BIPOLAR PROS AML 44 (Hips) ×2 IMPLANT
BIPOLAR PROS AML 44MM (Hips) ×1 IMPLANT
BLADE CLIPPER SURG (BLADE) IMPLANT
BLADE SAW SGTL 18X1.27X75 (BLADE) ×2 IMPLANT
BLADE SAW SGTL 18X1.27X75MM (BLADE) ×1
CLOSURE WOUND 1/2 X4 (GAUZE/BANDAGES/DRESSINGS) ×2
COVER SURGICAL LIGHT HANDLE (MISCELLANEOUS) ×3 IMPLANT
COVER WAND RF STERILE (DRAPES) ×3 IMPLANT
DRAPE C-ARM 42X72 X-RAY (DRAPES) ×3 IMPLANT
DRAPE STERI IOBAN 125X83 (DRAPES) ×3 IMPLANT
DRAPE U-SHAPE 47X51 STRL (DRAPES) ×9 IMPLANT
DRSG AQUACEL AG ADV 3.5X10 (GAUZE/BANDAGES/DRESSINGS) ×3 IMPLANT
DURAPREP 26ML APPLICATOR (WOUND CARE) ×3 IMPLANT
ELECT BLADE 4.0 EZ CLEAN MEGAD (MISCELLANEOUS) ×3
ELECT BLADE 6.5 EXT (BLADE) IMPLANT
ELECT REM PT RETURN 9FT ADLT (ELECTROSURGICAL) ×3
ELECTRODE BLDE 4.0 EZ CLN MEGD (MISCELLANEOUS) ×1 IMPLANT
ELECTRODE REM PT RTRN 9FT ADLT (ELECTROSURGICAL) ×1 IMPLANT
FACESHIELD WRAPAROUND (MASK) ×6 IMPLANT
FACESHIELD WRAPAROUND OR TEAM (MASK) ×2 IMPLANT
GAUZE XEROFORM 5X9 LF (GAUZE/BANDAGES/DRESSINGS) ×2 IMPLANT
GLOVE BIOGEL PI IND STRL 8 (GLOVE) ×2 IMPLANT
GLOVE BIOGEL PI INDICATOR 8 (GLOVE) ×4
GLOVE ECLIPSE 8.0 STRL XLNG CF (GLOVE) ×3 IMPLANT
GLOVE ORTHO TXT STRL SZ7.5 (GLOVE) ×6 IMPLANT
GOWN STRL REUS W/ TWL LRG LVL3 (GOWN DISPOSABLE) ×2 IMPLANT
GOWN STRL REUS W/ TWL XL LVL3 (GOWN DISPOSABLE) ×2 IMPLANT
GOWN STRL REUS W/TWL LRG LVL3 (GOWN DISPOSABLE) ×6
GOWN STRL REUS W/TWL XL LVL3 (GOWN DISPOSABLE) ×6
HANDPIECE INTERPULSE COAX TIP (DISPOSABLE) ×3
HEAD BIPOLAR PROS AML 44 (Hips) IMPLANT
HEAD FEM STD 28X+1.5 STRL (Hips) ×2 IMPLANT
KIT BASIN OR (CUSTOM PROCEDURE TRAY) ×3 IMPLANT
KIT TURNOVER KIT B (KITS) ×3 IMPLANT
MANIFOLD NEPTUNE II (INSTRUMENTS) ×3 IMPLANT
NS IRRIG 1000ML POUR BTL (IV SOLUTION) ×3 IMPLANT
PACK TOTAL JOINT (CUSTOM PROCEDURE TRAY) ×3 IMPLANT
PAD ARMBOARD 7.5X6 YLW CONV (MISCELLANEOUS) ×3 IMPLANT
SET HNDPC FAN SPRY TIP SCT (DISPOSABLE) ×1 IMPLANT
STAPLER VISISTAT 35W (STAPLE) IMPLANT
STEM CORAIL KA14 (Stem) ×2 IMPLANT
STRIP CLOSURE SKIN 1/2X4 (GAUZE/BANDAGES/DRESSINGS) ×4 IMPLANT
SUT ETHIBOND NAB CT1 #1 30IN (SUTURE) ×3 IMPLANT
SUT MNCRL AB 4-0 PS2 18 (SUTURE) IMPLANT
SUT VIC AB 0 CT1 27 (SUTURE) ×3
SUT VIC AB 0 CT1 27XBRD ANBCTR (SUTURE) ×1 IMPLANT
SUT VIC AB 1 CT1 27 (SUTURE) ×3
SUT VIC AB 1 CT1 27XBRD ANBCTR (SUTURE) ×1 IMPLANT
SUT VIC AB 2-0 CT1 27 (SUTURE) ×3
SUT VIC AB 2-0 CT1 TAPERPNT 27 (SUTURE) ×1 IMPLANT
TOWEL OR 17X24 6PK STRL BLUE (TOWEL DISPOSABLE) ×3 IMPLANT
TOWEL OR 17X26 10 PK STRL BLUE (TOWEL DISPOSABLE) ×3 IMPLANT
TRAY CATH 16FR W/PLASTIC CATH (SET/KITS/TRAYS/PACK) IMPLANT
TRAY FOLEY MTR SLVR 16FR STAT (SET/KITS/TRAYS/PACK) ×2 IMPLANT
WATER STERILE IRR 1000ML POUR (IV SOLUTION) ×6 IMPLANT

## 2018-06-11 NOTE — ED Provider Notes (Signed)
[facility] EMERGENCY DEPARTMENT Provider Note   CSN: [id number] Arrival date & time: 06/11/18  1211     History   Chief Complaint No chief complaint on file.   HPI Jessica Bruce is a 84 y.o. female presenting for further evaluation of right hip pain after falling yesterday. She was walking down a flight of steps when she thought she was at the bottom, unfortunately had 2 steps to go, and fell, landing on her right side.  She has pain in her right hip and has been unable to bear weight since the fall.  Also complaints of right shoulder bruising and soreness but can move the shoulder without complaint.  She denies neck pain, but may have hit the right side of her head on the hardwood floor. No loc. Denies headache, weakness or numbness in her extremities.  She has had ibuprofen around 7 am today with no sig improvement in pain.  The history is provided by the patient and a relative.    Past Medical History:  Diagnosis Date  . Anxiety   . Hypertension   . Osteoporosis     There are no active problems to display for this patient.   Past Surgical History:  Procedure Laterality Date  . CHOLECYSTECTOMY    . COLON SURGERY       OB History   None      Home Medications    Prior to Admission medications   Medication Sig Start Date End Date Taking? Authorizing Provider  acetaminophen (TYLENOL) 500 MG tablet Take 500-1,000 mg by mouth every 6 (six) hours as needed for headache (pain).    [provider]  amLODipine (NORVASC) 10 MG tablet Take 10 mg by mouth daily with supper.    [provider]  amLODipine (NORVASC) 5 MG tablet Take 5 mg by mouth every evening.    [provider]  Calcium Carb-Cholecalciferol (CALCIUM 600 + D) 600-200 MG-UNIT TABS Take 1 tablet by mouth daily with lunch.    [provider]  Calcium Carbonate-Vitamin D (CALCIUM + D PO) Take 1 tablet by mouth daily.    [provider]  carvedilol  (COREG) 3.125 MG tablet Take 3.125 mg by mouth 2 (two) times daily with a meal.    [provider]  carvedilol (COREG) 3.125 MG tablet Take 3.125 mg by mouth daily with supper.    [provider]  cholecalciferol (VITAMIN D) 1000 units tablet Take 1,000 Units by mouth daily.    [provider]  Cholecalciferol (VITAMIN D-3) 1000 UNITS CAPS Take 1,000 Units by mouth daily.    [provider]  escitalopram (LEXAPRO) 5 MG tablet Take 5 mg by mouth at bedtime. 08/13/16   [provider]  fexofenadine (ALLEGRA) 180 MG tablet Take 180 mg by mouth daily as needed (seasonal allergies).    [provider]  hydrALAZINE (APRESOLINE) 10 MG tablet Take 10 mg by mouth 2 (two) times daily.    [provider]  hydrALAZINE (APRESOLINE) 10 MG tablet Take 10 mg by mouth 2 (two) times daily.    [provider]  ketotifen (ALAWAY) 0.025 % ophthalmic solution Place 1 drop into both eyes 2 (two) times daily as needed (allergies).    [provider]  ketotifen (ALAWAY) 0.025 % ophthalmic solution Place 1 drop into both eyes daily as needed (allergies/itching).    [provider]  losartan-hydrochlorothiazide (HYZAAR) 100-25 MG per tablet Take 1 tablet by mouth daily.  [provider]  losartan-hydrochlorothiazide (HYZAAR) 100-25 MG tablet Take 1 tablet by mouth daily. 10/17/16   [provider]  Omega-3 Fatty Acids (FISH OIL PO) Take 1 tablet by mouth daily.    [provider]  Omega-3 Fatty Acids (FISH OIL) 1000 MG CAPS Take 1,000 mg by mouth daily with lunch.    [provider]  ondansetron (ZOFRAN ODT) 4 MG disintegrating tablet Take 1 tablet (4 mg total) by mouth every 8 (eight) hours as needed for nausea. 08/21/13   Larene Pickett, PA-C  ondansetron (ZOFRAN ODT) 4 MG disintegrating tablet Take 1 tablet (4 mg total) by mouth every 8 (eight) hours as needed for nausea. 11/01/16   Larene Pickett, PA-C     Family History History reviewed. No pertinent family history.  Social History Social History   Tobacco Use  . Smoking status: Never Smoker  . Smokeless tobacco: Never Used  Substance Use Topics  . Alcohol use: Yes    Alcohol/week: 1.0 standard drinks    Types: 1 Glasses of wine per week  . Drug use: No     Allergies   Azithromycin; Penicillins; and Penicillins   Review of Systems Review of Systems  Constitutional: Negative.   HENT: Negative.   Cardiovascular: Negative.   Gastrointestinal: Negative for nausea and vomiting.  Genitourinary: Negative.   Musculoskeletal: Positive for arthralgias. Negative for back pain, joint swelling, myalgias and neck pain.  Skin: Positive for color change.  Neurological: Negative for weakness, numbness and headaches.     Physical Exam Updated Vital Signs BP (!) 182/78 (BP Location: Right Arm)   Pulse 84   Temp 98.4 F (36.9 C) (Oral)   Resp 16   Ht 5' (1.524 m)   Wt 61.2 kg   SpO2 96%   BMI 26.37 kg/m   Physical Exam  Constitutional: She is oriented to person, place, and time. She appears well-developed and well-nourished.  HENT:  Head: Normocephalic and atraumatic.  Mouth/Throat: Oropharynx is clear and moist.  Eyes: Conjunctivae are normal.  Neck: Normal range of motion. Neck supple.  No midline pain  Cardiovascular: Normal rate, regular rhythm, normal heart sounds and intact distal pulses.  Pulses:      Dorsalis pedis pulses are 2+ on the right side, and 2+ on the left side.  Pulmonary/Chest: Effort normal and breath sounds normal. She has no wheezes.  Abdominal: Soft. Bowel sounds are normal. There is no tenderness.  Musculoskeletal: She exhibits deformity. She exhibits no edema.  Mild bruising noted right anterior shoulder.  Right hip ttp, no obvious deformity but is holding her leg in external rotation, there is shortening.    Neurological: She is alert and oriented to person, place, and time. No cranial nerve  deficit or sensory deficit.  Equal grip strength. Normal coordination and ROM of upper extremities.  Lower exam deferred pending imaging.  Skin: Skin is warm and dry.  Psychiatric: She has a normal mood and affect.  Nursing note and vitals reviewed.    ED Treatments / Results  Labs (all labs ordered are listed, but only abnormal results are displayed) Labs Reviewed  CBC - Abnormal; Notable for the following components:      Result Value   WBC 15.1 (*)    Hemoglobin 15.4 (*)    HCT 46.2 (*)    All other components within normal limits  BASIC METABOLIC PANEL  PROTIME-INR  TYPE AND SCREEN    EKG None  Radiology Dg Shoulder Right  Result Date: 06/11/2018 CLINICAL DATA:  84 year old female with a history of fall and shoulder pain EXAM: RIGHT SHOULDER - 2+ VIEW COMPARISON:  None. FINDINGS: Nondisplaced fracture of the distal clavicle. There is evidence of periosteal reaction and partial remodeling. Degenerative changes of the acromioclavicular joint. Glenohumeral joint appears congruent. IMPRESSION: Subacute fracture of the distal clavicle with early callus formation. Osteopenia Electronically Signed   By: Corrie Mckusick D.O.   On: 06/11/2018 13:04   Ct Head Wo Contrast  Result Date: 06/11/2018 CLINICAL DATA:  Fall down steps.  Right neck stiffness. EXAM: CT HEAD WITHOUT CONTRAST CT CERVICAL SPINE WITHOUT CONTRAST TECHNIQUE: Multidetector CT imaging of the head and cervical spine was performed following the standard protocol without intravenous contrast. Multiplanar CT image reconstructions of the cervical spine were also generated. COMPARISON:  Cervical spine CT 09/26/2009.  CT head 11/28/2007 FINDINGS: CT HEAD FINDINGS Brain: There is atrophy and chronic small vessel disease changes. No acute intracranial abnormality. Specifically, no hemorrhage, hydrocephalus, mass lesion, acute infarction, or significant intracranial injury. Vascular: No hyperdense vessel or unexpected calcification.  Skull: No acute calvarial abnormality. Sinuses/Orbits: Mucosal thickening throughout the paranasal sinuses. No air-fluid levels. Mastoid air cells are clear. Orbital soft tissues unremarkable. Other: None CT CERVICAL SPINE FINDINGS Alignment: No subluxation. Skull base and vertebrae: No acute fracture. No primary bone lesion or focal pathologic process. Soft tissues and spinal canal: No prevertebral fluid or swelling. No visible canal hematoma. Disc levels: Advanced degenerative disc and facet disease throughout the cervical spine. Upper chest: No acute findings Other: None IMPRESSION: No acute intracranial abnormality. Atrophy, chronic microvascular disease. Degenerative disc and facet disease throughout the cervical spine. No acute bony abnormality. Electronically Signed   By: Rolm Baptise M.D.   On: 06/11/2018 13:49   Ct Cervical Spine Wo Contrast  Result Date: 06/11/2018 CLINICAL DATA:  Fall down steps.  Right neck stiffness. EXAM: CT HEAD WITHOUT CONTRAST CT CERVICAL SPINE WITHOUT CONTRAST TECHNIQUE: Multidetector CT imaging of the head and cervical spine was performed following the standard protocol without intravenous contrast. Multiplanar CT image reconstructions of the cervical spine were also generated. COMPARISON:  Cervical spine CT 09/26/2009.  CT head 11/28/2007 FINDINGS: CT HEAD FINDINGS Brain: There is atrophy and chronic small vessel disease changes. No acute intracranial abnormality. Specifically, no hemorrhage, hydrocephalus, mass lesion, acute infarction, or significant intracranial injury. Vascular: No hyperdense vessel or unexpected calcification. Skull: No acute calvarial abnormality. Sinuses/Orbits: Mucosal thickening throughout the paranasal sinuses. No air-fluid levels. Mastoid air cells are clear. Orbital soft tissues unremarkable. Other: None CT CERVICAL SPINE FINDINGS Alignment: No subluxation. Skull base and vertebrae: No acute fracture. No primary bone lesion or focal pathologic  process. Soft tissues and spinal canal: No prevertebral fluid or swelling. No visible canal hematoma. Disc levels: Advanced degenerative disc and facet disease throughout the cervical spine. Upper chest: No acute findings Other: None IMPRESSION: No acute intracranial abnormality. Atrophy, chronic microvascular disease. Degenerative disc and facet disease throughout the cervical spine. No acute bony abnormality. Electronically Signed   By: Rolm Baptise M.D.   On: 06/11/2018 13:49   Dg Hip Unilat W Or Wo Pelvis 2-3 Views Right  Result Date: 06/11/2018 CLINICAL DATA:  84 year old female with a history of fall and hip pain EXAM: DG HIP (WITH OR WITHOUT PELVIS) 2-3V RIGHT COMPARISON:  None. FINDINGS: Acute subcapital fracture of the hips. Osteopenia. Bony pelvic ring appears intact with no pelvic fracture identified. Left hip projects normally over the acetabula. Vascular calcifications. IMPRESSION: Acute right subcapital  hip fracture. Osteopenia Electronically Signed   By: Corrie Mckusick D.O.   On: 06/11/2018 13:05    Procedures Procedures (including critical care time)  Medications Ordered in ED Medications  morphine 4 MG/ML injection 4 mg (has no administration in time range)  morphine 2 MG/ML injection 2 mg (2 mg Intravenous Given 06/11/18 1321)  ondansetron (ZOFRAN) injection 4 mg (4 mg Intravenous Given 06/11/18 1320)     Initial Impression / Assessment and Plan / ED Course  I have reviewed the triage vital signs and the nursing notes.  Pertinent labs & imaging results that were available during my care of the patient were reviewed by me and considered in my medical decision making (see chart for details).     Pt with right subcapital hip fx along with right clavicular fx (looks subacute but pt and family denies any other falls). Call placed to Dr. Ninfa Linden with ortho. In OR, will call back after looks at films. Keep npo for now.  Admit to medicine.  Labs and imaging reviewed.  Discussed  wit pt and family who are aware and agree with plan.    Final Clinical Impressions(s) / ED Diagnoses   Final diagnoses:  Closed fracture of right hip, initial encounter [facility])  Closed nondisplaced fracture of acromial end of right clavicle, initial encounter  Fall, initial encounter    ED Discharge Orders    None       Landis Martins 06/11/18 [place], Nathan, MD 06/12/18 [phone]

## 2018-06-11 NOTE — Op Note (Signed)
NAME: Quinley, Daphanie K. MEDICAL RECORD MC:[id number] ACCOUNT [id number] DATE OF BIRTH:1922-10-14 FACILITY: [facility] LOCATION: [facility] PHYSICIAN:[place] Monds Kerry Fort, MD  OPERATIVE REPORT  DATE OF PROCEDURE:  06/11/2018  PREOPERATIVE DIAGNOSIS:  Right hip displaced femoral neck fracture.  POSTOPERATIVE DIAGNOSIS:  Right hip displaced femoral neck fracture.  PROCEDURE:  Right hip hemiarthroplasty.  IMPLANTS:  DePuy size 14 Corail femoral component with standard offset, size 44 metal bipolar head with 28+1.5 metal inner laying hip ball/spacer.  SURGEON:  Lind Guest. Ninfa Linden, MD  ASSISTANT:  Erskine Emery, PA-C  ANESTHESIA:  General.  ANTIBIOTICS:  Two grams IV Ancef.  ESTIMATED BLOOD LOSS:  836 mL  COMPLICATIONS:  None.  INDICATIONS:  The patient is a very active 84 year old female who is very independent.  She unfortunately sustained an accidental mechanical fall today without any syncopal episode, injuring her right hip.  She had the inability to ambulate.  She had no  previous history of hip pain or femur pain.  She was seen in the emergency room and found to have a displaced right hip femoral neck fracture.  She was seen by the medicine service and admitted and cleared for surgery.  We saw her at the same and  recommended a right hip hemiarthroplasty given the nature of this fracture.  I had a long and thorough discussion with her and her family at the bedside and they understand the need and recommendation for surgery.  The risks and benefits of this were  discussed in detail and informed consent was obtained.  DESCRIPTION OF PROCEDURE:  After informed consent was obtained, appropriate right hip was marked.  She was brought to the operating room.  General anesthesia was obtained while she was on her stretcher.  Foley catheter was placed and then traction boots  were placed on both her feet.  Next, she was placed supine on the Hana fracture table with a perineal post in place  and both legs in line skeletal traction device and no traction applied.  Her right operative hip was prepped and draped with DuraPrep and  sterile drapes.  A time-out was called to identify correct patient, correct right hip.  I then made an incision just inferior and posterior to the anterior superior iliac spine and carried this obliquely down the leg.  We dissected down tensor fascia  lata muscle.  Tensor fascia was then dissected longitudinally to proceed with direct anterior approach to the hip.  We identified and cauterized circumflex vessels.  I then identified the hip capsule, opened the hip capsule in an L-type format finding a  very large hematoma and a femoral neck fracture.  We made a freshening cut just below this fracture and above the lesser trochanter with an oscillating saw and completed this with an osteotome.  We removed the femoral head easily and removed bone  fragments from the acetabulum.  We left the remaining labrum and other tissue alone.  She showed no previous signs of any significant arthritis.  We trialed the femoral head.  We felt that a 44 femoral head was appropriate.  We then turned attention to  the femur.  With the leg externally rotated to 120 degrees, extended and adducted, we were able to place a Mueller retractor medially and a Hohmann retractor behind the greater trochanter.  We released lateral joint capsule and used a box-cutting  osteotome to enter femoral canal and a rongeur to lateralize then began broaching from size 8 broach using Corail broaching system going up to a size  13.  With a size 13 in place, we trialed our bipolar head.  It was a 44 with a 28+1.5 bipolar component.   We reduced this in the acetabulum.  We were pleased with stability, leg length and offset.  We dislocated the hip and removed the trial components.  We then placed the real Corail femoral component size 13 and the real 44, 28+1.5 bipolar hip ball,  reduced this again in the acetabulum  and it was stable.  We then irrigated the soft tissues with normal saline solution.  We closed joint capsule with interrupted #1 Ethibond suture, followed by running 0 Vicryl and tensor fascia, 0 Vicryl in the deep  tissue, 2-0 Vicryl subcutaneous tissue and interrupted staples on the skin.  Xeroform and Aquacel dressing was applied.  She was taken off of the fracture table, awakened, extubated, and taken to recovery room in stable condition.  All final counts were  correct.  There were no complications noted.  Of note, Benita Stabile, PA-C, assisted the entire case.  His assistance was crucial for facilitating all aspects of this case.  TN/NUANCE  D:06/11/2018 T:06/11/2018 JOB:[id number]

## 2018-06-11 NOTE — Progress Notes (Signed)
RN verified the presence of a signed informed consent that matches stated procedure by patient. Note new consent signed in Short Stay. Verified armband matches patient's stated name and birth date. Verified NPO status (0800 today) and that all jewelry, contact, glasses, dentures, and partials had been removed (if applicable).

## 2018-06-11 NOTE — ED Triage Notes (Signed)
Pt fell down stairs and landed on her right shoulder and hip.  Bruising noted to right shoulder, denies LOC or hitting head.

## 2018-06-11 NOTE — OR Nursing (Signed)
1845:  allevyn sacral pad placed to begin skin protection protocol; sacral skin clear and supple at this time.

## 2018-06-11 NOTE — H&P (Signed)
History and Physical    Jessica Bruce:[id number] DOB: 1923-04-24 DOA: 06/11/2018  PCP: Pa, [facility] Consultants:  Sallee Provencal - ENT; Bevelyn Buckles - audiology; Delman Cheadle - eye; [place] - derm Patient coming from:  Home - [facility] at [facility]; NOK: Daughter, [name], [phone]  Chief Complaint: Fall  HPI: Jessica Bruce is a 84 y.o. female with medical history significant of HTN; HLD; and osteoporosis presenting after a mechanical fall.   She fell and broke her hip.  She missed a step and went crashing to the floor.  She fell last night.  She also hurt her right shoulder in the fall.     ED Course:  R hip fracture.  Dr. Ninfa Linden is in surgery, he will review films when out of the OR.  Recommends NPO and admission to [facility].  Also with R clavicle fracture - ?subacute.  Will place in sling.  Review of Systems: As per HPI; otherwise review of systems reviewed and negative.   Ambulatory Status:  Ambulates without assistance  Past Medical History:  Diagnosis Date  . Anxiety   . Closed right hip fracture, initial encounter ([facility]) 06/11/2018  . Hyperglycemia   . Hypertension   . Osteoporosis     Past Surgical History:  Procedure Laterality Date  . CHOLECYSTECTOMY    . COLON SURGERY     pre-cancerous polyp very remotely    Social History   Socioeconomic History  . Marital status: Widowed    Spouse name: Not on file  . Number of children: Not on file  . Years of education: Not on file  . Highest education level: Not on file  Occupational History  . Occupation: retired  Scientific laboratory technician  . Financial resource strain: Not on file  . Food insecurity:    Worry: Not on file    Inability: Not on file  . Transportation needs:    Medical: Not on file    Non-medical: Not on file  Tobacco Use  . Smoking status: Never Smoker  . Smokeless tobacco: Never Used  Substance and Sexual Activity  . Alcohol use: Yes    Alcohol/week: 3.0 standard drinks   Types: 3 Glasses of wine per week  . Drug use: No  . Sexual activity: Not on file  Lifestyle  . Physical activity:    Days per week: Not on file    Minutes per session: Not on file  . Stress: Not on file  Relationships  . Social connections:    Talks on phone: Not on file    Gets together: Not on file    Attends religious service: Not on file    Active member of club or organization: Not on file    Attends meetings of clubs or organizations: Not on file    Relationship status: Not on file  . Intimate partner violence:    Fear of current or ex partner: Not on file    Emotionally abused: Not on file    Physically abused: Not on file    Forced sexual activity: Not on file  Other Topics Concern  . Not on file  Social History Narrative   ** Merged History Encounter **        Allergies  Allergen Reactions  . Azithromycin Nausea And Vomiting  . Penicillins Nausea And Vomiting  . Penicillins Nausea And Vomiting    .Has patient had a PCN reaction causing immediate rash, facial/tongue/throat swelling, SOB or lightheadedness with hypotension: No Has patient had  a PCN reaction causing severe rash involving mucus membranes or skin necrosis: No Has patient had a PCN reaction that required hospitalization No Has patient had a PCN reaction occurring within the last 10 years: No If all of the above answers are "NO", then may proceed with Cephalosporin use.    History reviewed. No pertinent family history.  Prior to Admission medications   Medication Sig Start Date End Date Taking? Authorizing Provider  acetaminophen (TYLENOL) 500 MG tablet Take 500-1,000 mg by mouth every 6 (six) hours as needed for headache (pain).    [provider]  amLODipine (NORVASC) 10 MG tablet Take 10 mg by mouth daily with supper.    [provider]  amLODipine (NORVASC) 5 MG tablet Take 5 mg by mouth every evening.    [provider]  Calcium Carb-Cholecalciferol (CALCIUM 600 + D)  600-200 MG-UNIT TABS Take 1 tablet by mouth daily with lunch.    [provider]  Calcium Carbonate-Vitamin D (CALCIUM + D PO) Take 1 tablet by mouth daily.    [provider]  carvedilol (COREG) 3.125 MG tablet Take 3.125 mg by mouth 2 (two) times daily with a meal.    [provider]  carvedilol (COREG) 3.125 MG tablet Take 3.125 mg by mouth daily with supper.    [provider]  cholecalciferol (VITAMIN D) 1000 units tablet Take 1,000 Units by mouth daily.    [provider]  Cholecalciferol (VITAMIN D-3) 1000 UNITS CAPS Take 1,000 Units by mouth daily.    [provider]  escitalopram (LEXAPRO) 5 MG tablet Take 5 mg by mouth at bedtime. 08/13/16   [provider]  fexofenadine (ALLEGRA) 180 MG tablet Take 180 mg by mouth daily as needed (seasonal allergies).    [provider]  hydrALAZINE (APRESOLINE) 10 MG tablet Take 10 mg by mouth 2 (two) times daily.    [provider]  hydrALAZINE (APRESOLINE) 10 MG tablet Take 10 mg by mouth 2 (two) times daily.    [provider]  ketotifen (ALAWAY) 0.025 % ophthalmic solution Place 1 drop into both eyes 2 (two) times daily as needed (allergies).    [provider]  ketotifen (ALAWAY) 0.025 % ophthalmic solution Place 1 drop into both eyes daily as needed (allergies/itching).    [provider]  losartan-hydrochlorothiazide (HYZAAR) 100-25 MG per tablet Take 1 tablet by mouth daily.    [provider]  losartan-hydrochlorothiazide (HYZAAR) 100-25 MG tablet Take 1 tablet by mouth daily. 10/17/16   [provider]  Omega-3 Fatty Acids (FISH OIL PO) Take 1 tablet by mouth daily.    [provider]  Omega-3 Fatty Acids (FISH OIL) 1000 MG CAPS Take 1,000 mg by mouth daily with lunch.    [provider]  ondansetron (ZOFRAN ODT) 4 MG disintegrating tablet Take 1 tablet (4 mg total) by mouth every 8 (eight) hours as needed  for nausea. 08/21/13   Larene Pickett, PA-C  ondansetron (ZOFRAN ODT) 4 MG disintegrating tablet Take 1 tablet (4 mg total) by mouth every 8 (eight) hours as needed for nausea. 11/01/16   Larene Pickett, PA-C    Physical Exam: Vitals:   06/11/18 1221 06/11/18 1445 06/11/18 1530 06/11/18 1608  BP: (!) 182/78 (!) 140/55 136/63 (!) 145/63  Pulse: 84 86 78 71  Resp: 16 16 20 16   Temp: 98.4 F (36.9 C)   98.7 F (37.1 C)  TempSrc: Oral   Oral  SpO2: 96% 94%  95% 97%  Weight:      Height:         General:  Appears calm and comfortable and is NAD Eyes:  PERRL, EOMI, normal lids, iris ENT:  Hard of hearing, normal lips & tongue, mmm Neck:  no LAD, masses or thyromegaly; no carotid bruits Cardiovascular:  RRR, no m/r/g. No LE edema.  Respiratory:   CTA bilaterally with no wheezes/rales/rhonchi.  Normal respiratory effort. Abdomen:  soft, NT, ND, NABS Skin: R shoulder echymoses Musculoskeletal: R leg shortening and external rotation Lower extremity:  No LE edema.  Limited foot exam with no ulcerations.  2+ distal pulses. Psychiatric:  grossly normal mood and affect, speech fluent and appropriate, AOx3 Neurologic:  CN 2-12 grossly intact, moves all extremities in coordinated fashion other than R foot, sensation intact    Radiological Exams on Admission: Dg Shoulder Right  Result Date: 06/11/2018 CLINICAL DATA:  84 year old female with a history of fall and shoulder pain EXAM: RIGHT SHOULDER - 2+ VIEW COMPARISON:  None. FINDINGS: Nondisplaced fracture of the distal clavicle. There is evidence of periosteal reaction and partial remodeling. Degenerative changes of the acromioclavicular joint. Glenohumeral joint appears congruent. IMPRESSION: Subacute fracture of the distal clavicle with early callus formation. Osteopenia Electronically Signed   By: Corrie Mckusick D.O.   On: 06/11/2018 13:04   Ct Head Wo Contrast  Result Date: 06/11/2018 CLINICAL DATA:  Fall down steps.  Right neck  stiffness. EXAM: CT HEAD WITHOUT CONTRAST CT CERVICAL SPINE WITHOUT CONTRAST TECHNIQUE: Multidetector CT imaging of the head and cervical spine was performed following the standard protocol without intravenous contrast. Multiplanar CT image reconstructions of the cervical spine were also generated. COMPARISON:  Cervical spine CT 09/26/2009.  CT head 11/28/2007 FINDINGS: CT HEAD FINDINGS Brain: There is atrophy and chronic small vessel disease changes. No acute intracranial abnormality. Specifically, no hemorrhage, hydrocephalus, mass lesion, acute infarction, or significant intracranial injury. Vascular: No hyperdense vessel or unexpected calcification. Skull: No acute calvarial abnormality. Sinuses/Orbits: Mucosal thickening throughout the paranasal sinuses. No air-fluid levels. Mastoid air cells are clear. Orbital soft tissues unremarkable. Other: None CT CERVICAL SPINE FINDINGS Alignment: No subluxation. Skull base and vertebrae: No acute fracture. No primary bone lesion or focal pathologic process. Soft tissues and spinal canal: No prevertebral fluid or swelling. No visible canal hematoma. Disc levels: Advanced degenerative disc and facet disease throughout the cervical spine. Upper chest: No acute findings Other: None IMPRESSION: No acute intracranial abnormality. Atrophy, chronic microvascular disease. Degenerative disc and facet disease throughout the cervical spine. No acute bony abnormality. Electronically Signed   By: Rolm Baptise M.D.   On: 06/11/2018 13:49   Ct Cervical Spine Wo Contrast  Result Date: 06/11/2018 CLINICAL DATA:  Fall down steps.  Right neck stiffness. EXAM: CT HEAD WITHOUT CONTRAST CT CERVICAL SPINE WITHOUT CONTRAST TECHNIQUE: Multidetector CT imaging of the head and cervical spine was performed following the standard protocol without intravenous contrast. Multiplanar CT image reconstructions of the cervical spine were also generated. COMPARISON:  Cervical spine CT 09/26/2009.  CT  head 11/28/2007 FINDINGS: CT HEAD FINDINGS Brain: There is atrophy and chronic small vessel disease changes. No acute intracranial abnormality. Specifically, no hemorrhage, hydrocephalus, mass lesion, acute infarction, or significant intracranial injury. Vascular: No hyperdense vessel or unexpected calcification. Skull: No acute calvarial abnormality. Sinuses/Orbits: Mucosal thickening throughout the paranasal sinuses. No air-fluid levels. Mastoid air cells are clear. Orbital soft tissues unremarkable. Other: None CT CERVICAL SPINE FINDINGS Alignment: No subluxation. Skull base and vertebrae: No acute  fracture. No primary bone lesion or focal pathologic process. Soft tissues and spinal canal: No prevertebral fluid or swelling. No visible canal hematoma. Disc levels: Advanced degenerative disc and facet disease throughout the cervical spine. Upper chest: No acute findings Other: None IMPRESSION: No acute intracranial abnormality. Atrophy, chronic microvascular disease. Degenerative disc and facet disease throughout the cervical spine. No acute bony abnormality. Electronically Signed   By: Rolm Baptise M.D.   On: 06/11/2018 13:49   Dg Hip Unilat W Or Wo Pelvis 2-3 Views Right  Result Date: 06/11/2018 CLINICAL DATA:  84 year old female with a history of fall and hip pain EXAM: DG HIP (WITH OR WITHOUT PELVIS) 2-3V RIGHT COMPARISON:  None. FINDINGS: Acute subcapital fracture of the hips. Osteopenia. Bony pelvic ring appears intact with no pelvic fracture identified. Left hip projects normally over the acetabula. Vascular calcifications. IMPRESSION: Acute right subcapital hip fracture. Osteopenia Electronically Signed   By: Corrie Mckusick D.O.   On: 06/11/2018 13:05    EKG: Independently reviewed.  NSR with rate 82; nonspecific ST changes which appear to be new since 4/18 tracing   Labs on Admission: I have personally reviewed the available labs and imaging studies at the time of the admission.  Pertinent  labs:   Glucose 115 WBC 15.1 Hgb 15.4   Assessment/Plan Principal Problem:   Closed right hip fracture, initial encounter [facility]) Active Problems:   Hypertension   Hyperglycemia   Right clavicle fracture   Hip fracture -Mechanical fall resulting in hip fracture -Orthopedics consulted -NPO in anticipation of surgical repair tonight -SCDs overnight, start Lovenox post-operatively (or as per ortho) -Pain control with Robxain, Vicodin, and Morphine prn -SW consult for rehab placement; CIR would be preferred, if possible -Will need PT consult post-operatively -Hip fracture order set utilized  HTN -Continue Norvasc, Coreg, Hydralazine -Hold Hyzaar for now  Hyperglycemia -May be stress response -Will follow with fasting AM labs -It is unlikely that she will need acute or chronic treatment for this issue  R clavicle fracture -Xray appears to indicate that this is subacute, but patient denies prior fall and has apparent ecchymoses surrounding the area now -Suspect acute fracture -She will need immobilization with a sling, which may mildly hinder her mobility post-operatively   DVT prophylaxis:  SCDs until approved for Lovenox by orthopedics Code Status:  DNR - confirmed with patient/family Family Communication: None present  Disposition Plan:  Likely to need SNF once clinically improved Consults called: Orthopedics; SW, Nutrition; will need PT post-operatively  Admission status: Admit - It is my clinical opinion that admission to INPATIENT is reasonable and necessary because of the expectation that this patient will require hospital care that crosses at least 2 midnights to treat this condition based on the medical complexity of the problems presented.  Given the aforementioned information, the predictability of an adverse outcome is felt to be significant.   Karmen Bongo MD [facility]  If note is complete, please contact covering daytime or nighttime  physician. [email or web address] Password [facility]  06/11/2018, 5:24 PM

## 2018-06-11 NOTE — Brief Op Note (Signed)
06/11/2018  6:36 PM  PATIENT:  Filbert Berthold  84 y.o. female  PRE-OPERATIVE DIAGNOSIS:  Right hip femoral neck fracture  POST-OPERATIVE DIAGNOSIS:  Right hip femoral neck fracture  PROCEDURE:  Procedure(s): Right hip hemiarthroplasty direct anterior (Right)  SURGEON:  Surgeon(s) and Role:    Mcarthur Rossetti, MD - Primary  PHYSICIAN ASSISTANT: Benita Stabile, PA-C  ANESTHESIA:   general  EBL:  200 mL   COUNTS:  YES  TOURNIQUET:  * No tourniquets in log *  DICTATION: .Other Dictation: Dictation Number [id number]  PLAN OF CARE: Admit to inpatient   PATIENT DISPOSITION:  PACU - hemodynamically stable.   Delay start of Pharmacological VTE agent (>24hrs) due to surgical blood loss or risk of bleeding: no

## 2018-06-11 NOTE — Consult Note (Signed)
Reason for Consult:Right hip fracture Referring Physician: [name] is an 84 y.o. female.  HPI: Patient is 84 year old female who had a mechanical fall last night when after she fell this last few steps.  She denies any loss of consciousness.  She sustained a right hip injury and was unable to bear weight.  She also sustained an injury to her right shoulder.  She is brought to the emergency room this morning found to have a right femoral neck fracture and a nondisplaced distal right clavicle fracture.  She denies any other injury.  She last ate/drank around 8 AM.  Past Medical History:  Diagnosis Date  . Anxiety   . Hypertension   . Osteoporosis     Past Surgical History:  Procedure Laterality Date  . CHOLECYSTECTOMY    . COLON SURGERY     pre-cancerous polyp very remotely    History reviewed. No pertinent family history.  Social History:  reports that she has never smoked. She has never used smokeless tobacco. She reports that she drinks about 3.0 standard drinks of alcohol per week. She reports that she does not use drugs.  Allergies:  Allergies  Allergen Reactions  . Azithromycin Nausea And Vomiting  . Penicillins Nausea And Vomiting  . Penicillins Nausea And Vomiting    .Has patient had a PCN reaction causing immediate rash, facial/tongue/throat swelling, SOB or lightheadedness with hypotension: No Has patient had a PCN reaction causing severe rash involving mucus membranes or skin necrosis: No Has patient had a PCN reaction that required hospitalization No Has patient had a PCN reaction occurring within the last 10 years: No If all of the above answers are "NO", then may proceed with Cephalosporin use.    Medications: I have reviewed the patient's current medications.  Results for orders placed or performed during the hospital encounter of 06/11/18 (from the past 48 hour(s))  CBC     Status: Abnormal   Collection Time: 06/11/18 12:39 PM  Result Value  Ref Range   WBC 15.1 (H) 4.0 - 10.5 K/uL   RBC 4.85 3.87 - 5.11 MIL/uL   Hemoglobin 15.4 (H) 12.0 - 15.0 g/dL   HCT 46.2 (H) 36.0 - 46.0 %   MCV 95.3 80.0 - 100.0 fL   MCH 31.8 26.0 - 34.0 pg   MCHC 33.3 30.0 - 36.0 g/dL   RDW 12.7 11.5 - 15.5 %   Platelets 181 150 - 400 K/uL   nRBC 0.0 0.0 - 0.2 %    Comment: Performed at [facility], [place] [address] [place], [place] [address]  Basic metabolic panel     Status: Abnormal   Collection Time: 06/11/18 12:39 PM  Result Value Ref Range   Sodium 134 (L) 135 - 145 mmol/L   Potassium 3.4 (L) 3.5 - 5.1 mmol/L   Chloride 98 98 - 111 mmol/L   CO2 23 22 - 32 mmol/L   Glucose, Bld 115 (H) 70 - 99 mg/dL   BUN 18 8 - 23 mg/dL   Creatinine, Ser 0.81 0.44 - 1.00 mg/dL   Calcium 10.2 8.9 - 10.3 mg/dL   GFR calc non Af Amer >60 >60 mL/min   GFR calc Af Amer >60 >60 mL/min   Anion gap 13 5 - 15    Comment: Performed at [facility] [address], [place], [place] [address]    Dg Shoulder Right  Result Date: 06/11/2018 CLINICAL DATA:  84 year old female with a history  of fall and shoulder pain EXAM: RIGHT SHOULDER - 2+ VIEW COMPARISON:  None. FINDINGS: Nondisplaced fracture of the distal clavicle. There is evidence of periosteal reaction and partial remodeling. Degenerative changes of the acromioclavicular joint. Glenohumeral joint appears congruent. IMPRESSION: Subacute fracture of the distal clavicle with early callus formation. Osteopenia Electronically Signed   By: Corrie Mckusick D.O.   On: 06/11/2018 13:04   Ct Head Wo Contrast  Result Date: 06/11/2018 CLINICAL DATA:  Fall down steps.  Right neck stiffness. EXAM: CT HEAD WITHOUT CONTRAST CT CERVICAL SPINE WITHOUT CONTRAST TECHNIQUE: Multidetector CT imaging of the head and cervical spine was performed following the standard protocol without intravenous contrast. Multiplanar CT image reconstructions of the cervical spine were also generated. COMPARISON:  Cervical spine CT  09/26/2009.  CT head 11/28/2007 FINDINGS: CT HEAD FINDINGS Brain: There is atrophy and chronic small vessel disease changes. No acute intracranial abnormality. Specifically, no hemorrhage, hydrocephalus, mass lesion, acute infarction, or significant intracranial injury. Vascular: No hyperdense vessel or unexpected calcification. Skull: No acute calvarial abnormality. Sinuses/Orbits: Mucosal thickening throughout the paranasal sinuses. No air-fluid levels. Mastoid air cells are clear. Orbital soft tissues unremarkable. Other: None CT CERVICAL SPINE FINDINGS Alignment: No subluxation. Skull base and vertebrae: No acute fracture. No primary bone lesion or focal pathologic process. Soft tissues and spinal canal: No prevertebral fluid or swelling. No visible canal hematoma. Disc levels: Advanced degenerative disc and facet disease throughout the cervical spine. Upper chest: No acute findings Other: None IMPRESSION: No acute intracranial abnormality. Atrophy, chronic microvascular disease. Degenerative disc and facet disease throughout the cervical spine. No acute bony abnormality. Electronically Signed   By: Rolm Baptise M.D.   On: 06/11/2018 13:49   Ct Cervical Spine Wo Contrast  Result Date: 06/11/2018 CLINICAL DATA:  Fall down steps.  Right neck stiffness. EXAM: CT HEAD WITHOUT CONTRAST CT CERVICAL SPINE WITHOUT CONTRAST TECHNIQUE: Multidetector CT imaging of the head and cervical spine was performed following the standard protocol without intravenous contrast. Multiplanar CT image reconstructions of the cervical spine were also generated. COMPARISON:  Cervical spine CT 09/26/2009.  CT head 11/28/2007 FINDINGS: CT HEAD FINDINGS Brain: There is atrophy and chronic small vessel disease changes. No acute intracranial abnormality. Specifically, no hemorrhage, hydrocephalus, mass lesion, acute infarction, or significant intracranial injury. Vascular: No hyperdense vessel or unexpected calcification. Skull: No acute  calvarial abnormality. Sinuses/Orbits: Mucosal thickening throughout the paranasal sinuses. No air-fluid levels. Mastoid air cells are clear. Orbital soft tissues unremarkable. Other: None CT CERVICAL SPINE FINDINGS Alignment: No subluxation. Skull base and vertebrae: No acute fracture. No primary bone lesion or focal pathologic process. Soft tissues and spinal canal: No prevertebral fluid or swelling. No visible canal hematoma. Disc levels: Advanced degenerative disc and facet disease throughout the cervical spine. Upper chest: No acute findings Other: None IMPRESSION: No acute intracranial abnormality. Atrophy, chronic microvascular disease. Degenerative disc and facet disease throughout the cervical spine. No acute bony abnormality. Electronically Signed   By: Rolm Baptise M.D.   On: 06/11/2018 13:49   Dg Hip Unilat W Or Wo Pelvis 2-3 Views Right  Result Date: 06/11/2018 CLINICAL DATA:  84 year old female with a history of fall and hip pain EXAM: DG HIP (WITH OR WITHOUT PELVIS) 2-3V RIGHT COMPARISON:  None. FINDINGS: Acute subcapital fracture of the hips. Osteopenia. Bony pelvic ring appears intact with no pelvic fracture identified. Left hip projects normally over the acetabula. Vascular calcifications. IMPRESSION: Acute right subcapital hip fracture. Osteopenia Electronically Signed   By: Corrie Mckusick  D.O.   On: 06/11/2018 13:05    ROS  See HPI otherwise negative.  Blood pressure 136/63, pulse 78, temperature 98.4 F (36.9 C), temperature source Oral, resp. rate 20, height 5' (1.524 m), weight 61.2 kg, SpO2 95 %. Physical Exam  Constitutional: She is oriented to person, place, and time. She appears well-developed and well-nourished. No distress.  HENT:  Head: Normocephalic.  Cardiovascular: Intact distal pulses.  Respiratory: Effort normal.  Neurological: She is alert and oriented to person, place, and time.  Skin: Skin is warm. She is not diaphoretic.  Psychiatric: She has a normal mood  and affect.  Upper Extremities: Good range of motion bilateral shoulders.  She has tenderness over the right distal clavicle with areas of ecchymosis.  There is no tenting skin.  Otherwise upper extremity nontender throughout Lower extremities: She has full sensation bilateral feet light touch.  Dorsiflexion plantarflexion bilateral ankles intact.  Shortening of right leg compared to left.  Assessment/Plan: Right hip femoral neck fracture.  Plan right hip hemiarthroplasty later today. NPO Strict bed rest Right Clavicle conservative treatment, right arm activities as tolerated.   GILBERT CLARK 06/11/2018, 3:52 PM

## 2018-06-11 NOTE — ED Notes (Signed)
Pt placed on 2L for o2 sats 85-89% on RA.

## 2018-06-11 NOTE — Anesthesia Preprocedure Evaluation (Signed)
Anesthesia Evaluation  Patient identified by MRN, date of birth, ID band Patient awake    Reviewed: Allergy & Precautions, NPO status , Patient's Chart, lab work & pertinent test results  Airway Mallampati: II  TM Distance: >3 FB     Dental   Pulmonary neg pulmonary ROS,    breath sounds clear to auscultation       Cardiovascular hypertension,  Rhythm:Regular Rate:Normal     Neuro/Psych    GI/Hepatic negative GI ROS, Neg liver ROS,   Endo/Other  negative endocrine ROS  Renal/GU negative Renal ROS     Musculoskeletal   Abdominal   Peds  Hematology   Anesthesia Other Findings   Reproductive/Obstetrics                             Anesthesia Physical Anesthesia Plan  ASA: III  Anesthesia Plan: General   Post-op Pain Management:    Induction: Intravenous  PONV Risk Score and Plan: 3 and Treatment may vary due to age or medical condition  Airway Management Planned: Oral ETT  Additional Equipment:   Intra-op Plan:   Post-operative Plan: Possible Post-op intubation/ventilation  Informed Consent: I have reviewed the patients History and Physical, chart, labs and discussed the procedure including the risks, benefits and alternatives for the proposed anesthesia with the patient or authorized representative who has indicated his/her understanding and acceptance.   Dental advisory given  Plan Discussed with: CRNA, Anesthesiologist and Surgeon  Anesthesia Plan Comments:         Anesthesia Quick Evaluation

## 2018-06-12 DIAGNOSIS — R7989 Other specified abnormal findings of blood chemistry: Secondary | ICD-10-CM

## 2018-06-12 LAB — BASIC METABOLIC PANEL
Anion gap: 12 (ref 5–15)
BUN: 19 mg/dL (ref 8–23)
CALCIUM: 9.6 mg/dL (ref 8.9–10.3)
CO2: 24 mmol/L (ref 22–32)
Chloride: 97 mmol/L — ABNORMAL LOW (ref 98–111)
Creatinine, Ser: 1.05 mg/dL — ABNORMAL HIGH (ref 0.44–1.00)
GFR calc Af Amer: 53 mL/min — ABNORMAL LOW (ref >60)
GFR, EST NON AFRICAN AMERICAN: 46 mL/min — AB (ref >60)
Glucose, Bld: 128 mg/dL — ABNORMAL HIGH (ref 70–99)
Potassium: 3.9 mmol/L (ref 3.5–5.1)
SODIUM: 133 mmol/L — AB (ref 135–145)

## 2018-06-12 LAB — CBC
HCT: 38.1 % (ref 36.0–46.0)
Hemoglobin: 12.8 g/dL (ref 12.0–15.0)
MCH: 31.9 pg (ref 26.0–34.0)
MCHC: 33.6 g/dL (ref 30.0–36.0)
MCV: 95 fL (ref 80.0–100.0)
PLATELETS: 165 10*3/uL (ref 150–400)
RBC: 4.01 MIL/uL (ref 3.87–5.11)
RDW: 12.9 % (ref 11.5–15.5)
WBC: 12.4 10*3/uL — ABNORMAL HIGH (ref 4.0–10.5)
nRBC: 0 % (ref 0.0–0.2)

## 2018-06-12 MED ORDER — ENSURE SURGERY PO LIQD
237.0000 mL | ORAL | Status: DC
  Administered 2018-06-14 – 2018-06-15 (×2): 237 mL via ORAL
  Filled 2018-06-12 (×3): qty 237

## 2018-06-12 MED ORDER — CALCIUM CARBONATE ANTACID 500 MG PO CHEW
400.0000 mg | CHEWABLE_TABLET | Freq: Every day | ORAL | Status: DC
  Administered 2018-06-13 – 2018-06-15 (×3): 400 mg via ORAL
  Filled 2018-06-12 (×3): qty 2

## 2018-06-12 MED ORDER — VITAMIN D 25 MCG (1000 UNIT) PO TABS
1000.0000 [IU] | ORAL_TABLET | Freq: Every day | ORAL | Status: DC
  Administered 2018-06-13 – 2018-06-15 (×3): 1000 [IU] via ORAL
  Filled 2018-06-12 (×3): qty 1

## 2018-06-12 MED ORDER — CARVEDILOL 6.25 MG PO TABS
6.2500 mg | ORAL_TABLET | Freq: Two times a day (BID) | ORAL | Status: DC
  Administered 2018-06-12 – 2018-06-13 (×3): 6.25 mg via ORAL
  Filled 2018-06-12 (×3): qty 1

## 2018-06-12 NOTE — Evaluation (Signed)
Physical Therapy Evaluation Patient Details Name: Jessica Bruce MRN: [id number] DOB: [birth year] Today's Date: 06/12/2018   History of Present Illness  Pt s/p fall in which she sustained R hip fx and R clavicle fx. Underwent R hip hemiarthoplasty on 06/11/18.  Per Dr. Rush Farmer, [place] to gently weight bear on UE.  WBAT LE.  Clinical Impression  Patient presents with dependencies in gait and transfers due to pain, decreased balance.  Patient will benefit from continued PT to progress mobility and independence.  Patient was extremely independent and active prior to fall.  Thus, I feel patient may benefit from CIR to achieve max potential and return to [name].      Follow Up Recommendations CIR    Equipment Recommendations  Rolling walker with 5" wheels;3in1 (PT)    Recommendations for Other Services Rehab consult     Precautions / Restrictions Precautions Precautions: Fall Restrictions RUE Weight Bearing: Weight bearing as tolerated RLE Weight Bearing: Weight bearing as tolerated      Mobility  Bed Mobility Overal bed mobility: Needs Assistance Bed Mobility: Supine to Sit     Supine to sit: Min assist     General bed mobility comments: min assist to scoot to EOB and lift shoulders off bed  Transfers Overall transfer level: Needs assistance Equipment used: Rolling walker (2 wheeled) Transfers: Sit to/from Stand Sit to Stand: Mod assist         General transfer comment: assist to power up without increased weight bearing on RUE  Ambulation/Gait Ambulation/Gait assistance: Min assist Gait Distance (Feet): 5 Feet Assistive device: Rolling walker (2 wheeled) Gait Pattern/deviations: Step-to pattern;Decreased weight shift to right;Decreased stride length     General Gait Details: encouraged weight bearing on RLE, pt more shuffling feet today instead of weight bearing and stepping  Stairs            Wheelchair Mobility    Modified Rankin (Stroke Patients  Only)       Balance Overall balance assessment: Needs assistance Sitting-balance support: Feet supported;Bilateral upper extremity supported Sitting balance-Leahy Scale: Fair     Standing balance support: Bilateral upper extremity supported;During functional activity Standing balance-Leahy Scale: Poor Standing balance comment: reliant on RW for balance                             Pertinent Vitals/Pain Pain Assessment: 0-10 Pain Score: 5  Pain Location: right UE Pain Descriptors / Indicators: Aching;Discomfort Pain Intervention(s): Limited activity within patient's tolerance;Monitored during session    Home Living Family/patient expects to be discharged to:: Private residence(independent living at [facility]) Living Arrangements: Alone Available Help at Discharge: (can pay for personal care attendants) Type of Home: Apartment Home Access: Level entry     Home Layout: One level        Prior Function Level of Independence: Independent               Hand Dominance        Extremity/Trunk Assessment        Lower Extremity Assessment Lower Extremity Assessment: RLE deficits/detail;Overall WFL for tasks assessed RLE Deficits / Details: limited hip movement due to pain    Cervical / Trunk Assessment Cervical / Trunk Assessment: Kyphotic  Communication   Communication: No difficulties  Cognition Arousal/Alertness: Awake/alert Behavior During Therapy: WFL for tasks assessed/performed Overall Cognitive Status: Within Functional Limits for tasks assessed  General Comments      Exercises Total Joint Exercises Ankle Circles/Pumps: AROM;Both;10 reps;Seated Long Arc Quad: AROM;Both;10 reps;Seated   Assessment/Plan    PT Assessment Patient needs continued PT services  PT Problem List Decreased strength;Decreased activity tolerance;Decreased balance;Decreased knowledge of use of DME;Decreased  mobility;Pain       PT Treatment Interventions DME instruction;Gait training;Functional mobility training;Balance training;Therapeutic exercise;Therapeutic activities;Patient/family education    PT Goals (Current goals can be found in the Care Plan section)  Acute Rehab PT Goals Patient Stated Goal: get back to [name] PT Goal Formulation: With patient/family Time For Goal Achievement: 06/12/18 Potential to Achieve Goals: Good    Frequency Min 4X/week   Barriers to discharge Decreased caregiver support lives alone in independent living apt at [facility] PT "6 Clicks" Mobility  Outcome Measure Help needed turning from your back to your side while in a flat bed without using bedrails?: A Lot Help needed moving from lying on your back to sitting on the side of a flat bed without using bedrails?: A Lot Help needed moving to and from a bed to a chair (including a wheelchair)?: A Lot Help needed standing up from a chair using your arms (e.g., wheelchair or bedside chair)?: A Lot Help needed to walk in hospital room?: A Lot Help needed climbing 3-5 steps with a railing? : Total 6 Click Score: 11    End of Session Equipment Utilized During Treatment: Gait belt Activity Tolerance: Patient tolerated treatment well Patient left: in chair;with chair alarm set;with call bell/phone within reach;with family/visitor present Nurse Communication: Mobility status PT Visit Diagnosis: Unsteadiness on feet (R26.81);Other abnormalities of gait and mobility (R26.89);Muscle weakness (generalized) (M62.81);Pain Pain - Right/Left: Right Pain - part of body: Shoulder    Time: 9509-3267 PT Time Calculation (min) (ACUTE ONLY): 19 min   Charges:   PT Evaluation $PT Eval Moderate Complexity: 1 Mod          06/12/2018 [place], PT Acute Rehabilitation Services Pager:  [phone] Office:  [phone]    Shanna Cisco 06/12/2018,  3:59 PM

## 2018-06-12 NOTE — NC FL2 (Addendum)
MEDICAID FL2 LEVEL OF CARE SCREENING TOOL     IDENTIFICATION  Patient Name: Jessica Bruce Birthdate: 06-26-1923 Sex: female Admission Date (Current Location): 06/11/2018  [facility] and [place] Number:  Herbalist and Address:  [facility], [place] [address] [place], [place] [address]      Provider Number: [id number]  Attending Physician Name and Address:  Mercy Riding, MD  Relative Name and Phone Number:  Genella Mech; daughter; [phone]    Current Level of Care: Hospital Recommended Level of Care: [place] Prior Approval Number:    Date Approved/Denied:   PASRR Number: pending  Discharge Plan: SNF    Current Diagnoses: Patient Active Problem List   Diagnosis Date Noted  . Closed right hip fracture, initial encounter ([place]) 06/11/2018  . Right clavicle fracture 06/11/2018  . Hypertension   . Hyperglycemia     Orientation RESPIRATION BLADDER Height & Weight     Self, Time, Situation, Place  O2 Continent, Indwelling catheter Weight: 135 lb (61.2 kg) Height:  5' (152.4 cm)  BEHAVIORAL SYMPTOMS/MOOD NEUROLOGICAL BOWEL NUTRITION STATUS      Continent Diet  AMBULATORY STATUS COMMUNICATION OF NEEDS Skin   Extensive Assist Verbally Surgical wounds, Skin abrasions(abrasion on right shoulder, surgical incision on hip with hydrocolloid dressing)                       Personal Care Assistance Level of Assistance  Bathing, Feeding, Dressing Bathing Assistance: Maximum assistance Feeding assistance: Limited assistance Dressing Assistance: Maximum assistance     Functional Limitations Info  Sight, Hearing, Speech Sight Info: Adequate Hearing Info: Adequate Speech Info: Adequate    SPECIAL CARE FACTORS FREQUENCY  PT (By licensed PT), OT (By licensed OT)     PT Frequency: 5x week OT Frequency: 5x week            Contractures Contractures Info: Not present    Additional Factors Info  Code  Status, Allergies, Psychotropic Code Status Info: DNR Allergies Info: AZITHROMYCIN, PENICILLINS  Psychotropic Info:Lexapro PO daily         Current Medications (06/12/2018):  This is the current hospital active medication list Current Facility-Administered Medications  Medication Dose Route Frequency Provider Last Rate Last Dose  . acetaminophen (TYLENOL) tablet 325-650 mg  325-650 mg Oral Q6H PRN Mcarthur Rossetti, MD   650 mg at 06/12/18 0853  . alum & mag hydroxide-simeth (MAALOX/MYLANTA) 200-200-20 MG/5ML suspension 30 mL  30 mL Oral Q4H PRN Mcarthur Rossetti, MD      . amLODipine (NORVASC) tablet 10 mg  10 mg Oral Q supper Mcarthur Rossetti, MD      . aspirin EC tablet 325 mg  325 mg Oral Q breakfast Mcarthur Rossetti, MD   325 mg at 06/12/18 6073  . calcium carbonate (TUMS - dosed in mg elemental calcium) chewable tablet 400 mg of elemental calcium  400 mg of elemental calcium Oral Daily Gonfa, Taye T, MD      . carvedilol (COREG) tablet 6.25 mg  6.25 mg Oral BID WC Gonfa, Taye T, MD      . cholecalciferol (VITAMIN D3) tablet 1,000 Units  1,000 Units Oral Daily Gonfa, Taye T, MD      . docusate sodium (COLACE) capsule 100 mg  100 mg Oral BID Mcarthur Rossetti, MD   100 mg at 06/12/18 0852  . docusate sodium (COLACE) capsule 100 mg  100 mg Oral  BID Mcarthur Rossetti, MD   100 mg at 06/11/18 2150  . escitalopram (LEXAPRO) tablet 5 mg  5 mg Oral QHS Mcarthur Rossetti, MD   5 mg at 06/11/18 2150  . hydrALAZINE (APRESOLINE) tablet 10 mg  10 mg Oral BID Mcarthur Rossetti, MD   10 mg at 06/12/18 0900  . hydrochlorothiazide (HYDRODIURIL) tablet 25 mg  25 mg Oral Daily Mcarthur Rossetti, MD   25 mg at 06/12/18 0853  . HYDROcodone-acetaminophen (NORCO) 7.5-325 MG per tablet 1-2 tablet  1-2 tablet Oral Q4H PRN Mcarthur Rossetti, MD      . HYDROcodone-acetaminophen (NORCO/VICODIN) 5-325 MG per tablet 1-2 tablet  1-2 tablet Oral Q6H PRN  Mcarthur Rossetti, MD      . HYDROcodone-acetaminophen (NORCO/VICODIN) 5-325 MG per tablet 1-2 tablet  1-2 tablet Oral Q4H PRN Mcarthur Rossetti, MD      . lactated ringers infusion   Intravenous Continuous Mcarthur Rossetti, MD   Stopped at 06/12/18 [phone]  . menthol-cetylpyridinium (CEPACOL) lozenge 3 mg  1 lozenge Oral PRN Mcarthur Rossetti, MD       Or  . phenol (CHLORASEPTIC) mouth spray 1 spray  1 spray Mouth/Throat PRN Mcarthur Rossetti, MD      . methocarbamol (ROBAXIN) tablet 500 mg  500 mg Oral Q6H PRN Mcarthur Rossetti, MD       Or  . methocarbamol (ROBAXIN) 500 mg in dextrose 5 % 50 mL IVPB  500 mg Intravenous Q6H PRN Mcarthur Rossetti, MD      . methocarbamol (ROBAXIN) tablet 500 mg  500 mg Oral Q6H PRN Mcarthur Rossetti, MD       Or  . methocarbamol (ROBAXIN) 500 mg in dextrose 5 % 50 mL IVPB  500 mg Intravenous Q6H PRN Mcarthur Rossetti, MD      . metoCLOPramide (REGLAN) tablet 5-10 mg  5-10 mg Oral Q8H PRN Mcarthur Rossetti, MD       Or  . metoCLOPramide (REGLAN) injection 5-10 mg  5-10 mg Intravenous Q8H PRN Mcarthur Rossetti, MD      . morphine 2 MG/ML injection 0.5 mg  0.5 mg Intravenous Q2H PRN Mcarthur Rossetti, MD      . morphine 2 MG/ML injection 0.5-1 mg  0.5-1 mg Intravenous Q2H PRN Mcarthur Rossetti, MD      . morphine 4 MG/ML injection 4 mg  4 mg Intravenous Q30 min PRN Mcarthur Rossetti, MD   4 mg at 06/11/18 1445  . ondansetron (ZOFRAN) tablet 4 mg  4 mg Oral Q6H PRN Mcarthur Rossetti, MD       Or  . ondansetron [facility]) injection 4 mg  4 mg Intravenous Q6H PRN Mcarthur Rossetti, MD      . pantoprazole (PROTONIX) EC tablet 40 mg  40 mg Oral Daily Mcarthur Rossetti, MD   40 mg at 06/12/18 9735  . polyethylene glycol (MIRALAX / GLYCOLAX) packet 17 g  17 g Oral Daily PRN Mcarthur Rossetti, MD      . traMADol [facility]at[facility]) tablet 50 mg  50 mg Oral Q6H Mcarthur Rossetti, MD   50 mg at 06/12/18 3299   Facility-Administered Medications Ordered in Other Encounters  Medication Dose Route Frequency Provider Last Rate Last Dose  . acetaminophen (OFIRMEV) IV   Intravenous Anesthesia Intra-op Milford Cage, CRNA   1,000 mg at 06/11/18 1806  . dexamethasone (DECADRON) injection    Anesthesia Intra-op  Milford Cage, CRNA   5 mg at 06/11/18 1746  . fentaNYL (SUBLIMAZE) injection    Anesthesia Intra-op Milford Cage, CRNA   75 mcg at 06/11/18 1800  . lactated ringers infusion    Continuous PRN Milford Cage, CRNA      . lidocaine 2% (20 mg/mL) 5 mL syringe   Intravenous Anesthesia Intra-op Milford Cage, CRNA   50 mg at 06/11/18 1820  . ondansetron (ZOFRAN) injection    Anesthesia Intra-op Milford Cage, CRNA   4 mg at 06/11/18 1824  . phenylephrine (NEO-SYNEPHRINE) 0.04 mg/mL in sodium chloride 0.9 % 250 mL infusion    Continuous PRN Milford Cage, CRNA 45 mL/hr at 06/11/18 1830 30 mcg/min at 06/11/18 1830  . phenylephrine (NEO-SYNEPHRINE) injection    Anesthesia Intra-op Milford Cage, CRNA   80 mcg at 06/11/18 1833  . propofol (DIPRIVAN) 10 mg/mL bolus/IV push    Anesthesia Intra-op Milford Cage, CRNA   90 mg at 06/11/18 1725  . rocuronium bromide 10 mg/mL (PF) syringe   Intravenous Anesthesia Intra-op Milford Cage, CRNA   10 mg at 06/11/18 1757  . sugammadex sodium (BRIDION) injection   Intravenous Anesthesia Intra-op Milford Cage, CRNA   200 mg at 06/11/18 6222     Discharge Medications: Please see discharge summary for a list of discharge medications.  Relevant Imaging Results:  Relevant Lab Results:   Additional Information SS[id number] [place], [place]

## 2018-06-12 NOTE — Social Work (Signed)
CSW acknowledging consult for SNF placement. Referral sent to pt's preferred facilities [facility] and [facility]. PASRR pending.   Westley Hummer, MSW, [place] Work [phone]

## 2018-06-12 NOTE — Progress Notes (Signed)
Patient ID: Jessica Bruce, female   DOB: 1923-07-03, 84 y.o.   MRN: [id number] Awake and alert.  Eating this am.  Tolerated surgery well on her right hip.  Follows commands appropriately.  From a therapy standpoint, she can WBAT on her right hip with no hip precautions.  She does have a right lateral clavicle fracture.  I plan to try this very conservatively.  I think a sling will limit her mobility and she is tolerating the fracture well.  She can still try to mobilize with a standard walker.  Just aspirin for DVT coverage.

## 2018-06-12 NOTE — Progress Notes (Signed)
Nutrition Brief Note  Nutrition Screen performed as part of hip fracture protocol.  Wt Readings from Last 15 Encounters:  06/11/18 61.2 kg   Pt reports she lives in an independent living facility, [facility]. She says she lives in an apartment and makes her own B/L each day and then eats her dinner in the facility dining room. She says she does not follow any therapeutic diet, though she is cognizant of her salt intake. She does not require any altered diet textures or consistencies. She takes a Vit D and Vit C supplement. She says she was taken off of her calcium years ago d/t a high calcium level.   She reports appetite changes consistent with age-related anorexia. She notes she doesn't seem to get as hungry anymore.   She is unsure if she has lost weight. Her current wt appears to have been reported/estimated as opposed to measured. She is in chair today and RD unable to get bed weight. She reports a UBW of 135 lbs. Per Care Everywhere, pt weighed 132 lbs roughly 2.5 years ago.   Pt is in remarkable shape for her age, and though she reports a loss of appetite, she says she still eats 3x a day and does not show any muscle/fat wasting. She has never had ensure, but is agreeable to trying q24 for extra nutrition.   Burtis Junes RD, LDN, CNSC Clinical Nutrition Available Tues-Sat via Pager: [phone] 06/12/2018 5:03 PM

## 2018-06-12 NOTE — Progress Notes (Addendum)
PROGRESS NOTE  Jessica Bruce AOZ:[id number] DOB: 07-16-1922 DOA: 06/11/2018 PCP: [facility]   LOS: 1 day   Brief Narrative / Interim history: 84 year old female with history of hypertension, hyperlipidemia, anxiety, osteoporosis presenting with mechanical fall and subsequent right hip fracture and right clavicular fracture.  Status post right hip hemiarthroplasty on 11/29.  Subjective: No major events overnight.  No complaints.  Pain well controlled.  Denies dyspnea or chest pain.  No bowel movement or gas yet.  Assessment & Plan: Principal Problem:   Closed right hip fracture, initial encounter [facility]) Active Problems:   Hypertension   Hyperglycemia   Right clavicle fracture  Mechanical fall/right hip fracture: Status post right hip hemiarthroplasty on 11/29. -Orthopedic surgery managing -Pain management -Encourage incentive spirometry -Wean oxygen -Bowel regimen while on pain medication.  History of osteoporosis -Vitamin D and calcium supplementation -Outpatient follow-up  Subacute right clavicular fracture -Pain management  Hypertension: Normotensive -Holding home losartan in the setting of mild creatinine elevation -Increasing home Coreg to 6.25 mg twice daily  Leukocytosis: Stress-induced.  Improved. -Continue trending  Mild creatinine elevation: Hold on losartan -Continue trending  Scheduled Meds: . amLODipine  10 mg Oral Q supper  . aspirin EC  325 mg Oral Q breakfast  . carvedilol  6.25 mg Oral BID WC  . docusate sodium  100 mg Oral BID  . docusate sodium  100 mg Oral BID  . escitalopram  5 mg Oral QHS  . hydrALAZINE  10 mg Oral BID  . hydrochlorothiazide  25 mg Oral Daily  . pantoprazole  40 mg Oral Daily  . traMADol  50 mg Oral Q6H   Continuous Infusions: . lactated ringers Stopped (06/12/18 0531)  . methocarbamol (ROBAXIN) IV    . methocarbamol (ROBAXIN) IV     PRN Meds:.acetaminophen, alum & mag hydroxide-simeth,  HYDROcodone-acetaminophen, HYDROcodone-acetaminophen, HYDROcodone-acetaminophen, menthol-cetylpyridinium **OR** phenol, methocarbamol **OR** methocarbamol (ROBAXIN) IV, methocarbamol **OR** methocarbamol (ROBAXIN) IV, metoCLOPramide **OR** metoCLOPramide (REGLAN) injection, morphine injection, morphine injection, morphine injection, ondansetron **OR** ondansetron (ZOFRAN) IV, polyethylene glycol  DVT prophylaxis: High-dose aspirin Code Status: DNR Family Communication: Daughter and son-in-law at bedside Disposition Plan: Remains inpatient.  Final disposition SNF.  Consultants:   Orthopedic surgery  Procedures:   Right hip hemiarthroplasty on 11/29  Antimicrobials:  None  Objective: Vitals:   06/11/18 2020 06/12/18 0047 06/12/18 0426 06/12/18 0828  BP: (!) 143/61 (!) 126/58 (!) 149/68 121/62  Pulse: 76 71 88 70  Resp:      Temp: 97.9 F (36.6 C) 98.3 F (36.8 C) 97.6 F (36.4 C) 98.5 F (36.9 C)  TempSrc: Oral Axillary Oral Oral  SpO2: 92% 98% 98% 96%  Weight:      Height:        Intake/Output Summary (Last 24 hours) at 06/12/2018 1228 Last data filed at 06/12/2018 0600 Gross per 24 hour  Intake 240.46 ml  Output 600 ml  Net -359.54 ml   Filed Weights   06/11/18 1219  Weight: 61.2 kg    Examination:  GENERAL: Appears well. No acute distress.  HEENT: MMM.  Vision and Hearing grossly intact.  NECK: Supple.  No JVD.  LUNGS: [place] in place.  Fair air movement bilaterally HEART:  RRR. Heart sounds normal.  ABD: Bowel sounds present. Soft. Non tender.  MSK/EXT:   no edema bilaterally.  Tenderness over lateral aspect of right clavicle.  No steps off SKIN: no apparent skin lesion.  Surgical wound appears clean with no surrounding swelling no significant erythema.  Dressing in place NEURO: Awake, alert and oriented appropriately.  No gross deficit.  PSYCH: Calm. Normal affect.   Data Reviewed: I have independently reviewed following labs and imaging studies    CBC: Recent Labs  Lab June 25, 2018 1239 06/12/18 0430  WBC 15.1* 12.4*  HGB 15.4* 12.8  HCT 46.2* 38.1  MCV 95.3 95.0  PLT 181 774   Basic Metabolic Panel: Recent Labs  Lab 2018-06-25 1239 06/12/18 0430  NA 134* 133*  K 3.4* 3.9  CL 98 97*  CO2 23 24  GLUCOSE 115* 128*  BUN 18 19  CREATININE 0.81 1.05*  CALCIUM 10.2 9.6   GFR: Estimated Creatinine Clearance: 27.4 mL/min (A) (by C-G formula based on SCr of 1.05 mg/dL (H)). Liver Function Tests: No results for input(s): AST, ALT, ALKPHOS, BILITOT, PROT, ALBUMIN in the last 168 hours. No results for input(s): LIPASE, AMYLASE in the last 168 hours. No results for input(s): AMMONIA in the last 168 hours. Coagulation Profile: Recent Labs  Lab Jun 25, 2018 1325  INR 0.94   Cardiac Enzymes: No results for input(s): CKTOTAL, CKMB, CKMBINDEX, TROPONINI in the last 168 hours. BNP (last 3 results) No results for input(s): PROBNP in the last 8760 hours. HbA1C: No results for input(s): HGBA1C in the last 72 hours. CBG: No results for input(s): GLUCAP in the last 168 hours. Lipid Profile: No results for input(s): CHOL, HDL, LDLCALC, TRIG, CHOLHDL, LDLDIRECT in the last 72 hours. Thyroid Function Tests: No results for input(s): TSH, T4TOTAL, FREET4, T3FREE, THYROIDAB in the last 72 hours. Anemia Panel: No results for input(s): VITAMINB12, FOLATE, FERRITIN, TIBC, IRON, RETICCTPCT in the last 72 hours. Urine analysis:    Component Value Date/Time   COLORURINE YELLOW 11/01/2016 1815   APPEARANCEUR CLEAR 11/01/2016 1815   LABSPEC 1.020 11/01/2016 1815   PHURINE 6.0 11/01/2016 1815   GLUCOSEU NEGATIVE 11/01/2016 1815   HGBUR NEGATIVE 11/01/2016 1815   BILIRUBINUR NEGATIVE 11/01/2016 1815   KETONESUR NEGATIVE 11/01/2016 1815   PROTEINUR NEGATIVE 11/01/2016 1815   UROBILINOGEN 0.2 11/28/2007 1817   NITRITE NEGATIVE 11/01/2016 1815   LEUKOCYTESUR NEGATIVE 11/01/2016 1815   Sepsis Labs: Invalid input(s): PROCALCITONIN,  LACTICIDVEN  No results found for this or any previous visit (from the past 240 hour(s)).    Radiology Studies: Dg Shoulder Right  Result Date: June 25, 2018 CLINICAL DATA:  84 year old female with a history of fall and shoulder pain EXAM: RIGHT SHOULDER - 2+ VIEW COMPARISON:  None. FINDINGS: Nondisplaced fracture of the distal clavicle. There is evidence of periosteal reaction and partial remodeling. Degenerative changes of the acromioclavicular joint. Glenohumeral joint appears congruent. IMPRESSION: Subacute fracture of the distal clavicle with early callus formation. Osteopenia Electronically Signed   By: Corrie Mckusick D.O.   On: June 25, 2018 13:04   Ct Head Wo Contrast  Result Date: June 25, 2018 CLINICAL DATA:  Fall down steps.  Right neck stiffness. EXAM: CT HEAD WITHOUT CONTRAST CT CERVICAL SPINE WITHOUT CONTRAST TECHNIQUE: Multidetector CT imaging of the head and cervical spine was performed following the standard protocol without intravenous contrast. Multiplanar CT image reconstructions of the cervical spine were also generated. COMPARISON:  Cervical spine CT 09/26/2009.  CT head 11/28/2007 FINDINGS: CT HEAD FINDINGS Brain: There is atrophy and chronic small vessel disease changes. No acute intracranial abnormality. Specifically, no hemorrhage, hydrocephalus, mass lesion, acute infarction, or significant intracranial injury. Vascular: No hyperdense vessel or unexpected calcification. Skull: No acute calvarial abnormality. Sinuses/Orbits: Mucosal thickening throughout the paranasal sinuses. No air-fluid levels. Mastoid air cells are clear. Orbital soft  tissues unremarkable. Other: None CT CERVICAL SPINE FINDINGS Alignment: No subluxation. Skull base and vertebrae: No acute fracture. No primary bone lesion or focal pathologic process. Soft tissues and spinal canal: No prevertebral fluid or swelling. No visible canal hematoma. Disc levels: Advanced degenerative disc and facet disease throughout the  cervical spine. Upper chest: No acute findings Other: None IMPRESSION: No acute intracranial abnormality. Atrophy, chronic microvascular disease. Degenerative disc and facet disease throughout the cervical spine. No acute bony abnormality. Electronically Signed   By: Rolm Baptise M.D.   On: 06/11/2018 13:49   Ct Cervical Spine Wo Contrast  Result Date: 06/11/2018 CLINICAL DATA:  Fall down steps.  Right neck stiffness. EXAM: CT HEAD WITHOUT CONTRAST CT CERVICAL SPINE WITHOUT CONTRAST TECHNIQUE: Multidetector CT imaging of the head and cervical spine was performed following the standard protocol without intravenous contrast. Multiplanar CT image reconstructions of the cervical spine were also generated. COMPARISON:  Cervical spine CT 09/26/2009.  CT head 11/28/2007 FINDINGS: CT HEAD FINDINGS Brain: There is atrophy and chronic small vessel disease changes. No acute intracranial abnormality. Specifically, no hemorrhage, hydrocephalus, mass lesion, acute infarction, or significant intracranial injury. Vascular: No hyperdense vessel or unexpected calcification. Skull: No acute calvarial abnormality. Sinuses/Orbits: Mucosal thickening throughout the paranasal sinuses. No air-fluid levels. Mastoid air cells are clear. Orbital soft tissues unremarkable. Other: None CT CERVICAL SPINE FINDINGS Alignment: No subluxation. Skull base and vertebrae: No acute fracture. No primary bone lesion or focal pathologic process. Soft tissues and spinal canal: No prevertebral fluid or swelling. No visible canal hematoma. Disc levels: Advanced degenerative disc and facet disease throughout the cervical spine. Upper chest: No acute findings Other: None IMPRESSION: No acute intracranial abnormality. Atrophy, chronic microvascular disease. Degenerative disc and facet disease throughout the cervical spine. No acute bony abnormality. Electronically Signed   By: Rolm Baptise M.D.   On: 06/11/2018 13:49   Pelvis Portable  Result Date:  06/11/2018 CLINICAL DATA:  Postop right hip arthroplasty. EXAM: PORTABLE PELVIS 1-2 VIEWS COMPARISON:  Operative images acquired earlier today. FINDINGS: Single AP image shows a right hip hemiarthroplasty, which appears well seated and aligned. No acute fracture or evidence of an operative complication. IMPRESSION: Well-positioned right hip hemiarthroplasty. Electronically Signed   By: Lajean Manes M.D.   On: 06/11/2018 19:20   Dg C-arm 1-60 Min  Result Date: 06/11/2018 CLINICAL DATA:  Right hip hemiarthroplasty. EXAM: DG C-ARM 61-120 MIN; OPERATIVE RIGHT HIP WITH PELVIS COMPARISON:  Earlier today. FINDINGS: Five intraoperative fluoroscopic images demonstrate interval placement of a right hip arthroplasty with prosthesis intact and normally located. Remainder of the exam is unchanged. IMPRESSION: Expected changes post placement of right hip arthroplasty. Electronically Signed   By: Marin Olp M.D.   On: 06/11/2018 18:49   Dg Hip Operative Unilat W Or W/o Pelvis Right  Result Date: 06/11/2018 CLINICAL DATA:  Right hip hemiarthroplasty. EXAM: DG C-ARM 61-120 MIN; OPERATIVE RIGHT HIP WITH PELVIS COMPARISON:  Earlier today. FINDINGS: Five intraoperative fluoroscopic images demonstrate interval placement of a right hip arthroplasty with prosthesis intact and normally located. Remainder of the exam is unchanged. IMPRESSION: Expected changes post placement of right hip arthroplasty. Electronically Signed   By: Marin Olp M.D.   On: 06/11/2018 18:49   Dg Hip Unilat W Or Wo Pelvis 2-3 Views Right  Result Date: 06/11/2018 CLINICAL DATA:  84 year old female with a history of fall and hip pain EXAM: DG HIP (WITH OR WITHOUT PELVIS) 2-3V RIGHT COMPARISON:  None. FINDINGS: Acute subcapital fracture of the  hips. Osteopenia. Bony pelvic ring appears intact with no pelvic fracture identified. Left hip projects normally over the acetabula. Vascular calcifications. IMPRESSION: Acute right subcapital hip fracture.  Osteopenia Electronically Signed   By: Corrie Mckusick D.O.   On: 06/11/2018 13:05      Izek Corvino T. [facility] Pager [phone]  If 7PM-7AM, please contact night-coverage [email or web address] Password [facility] 06/12/2018, 12:28 PM

## 2018-06-12 NOTE — Progress Notes (Signed)
Rehab Admissions Coordinator Note:  Patient was screened by Cleatrice Burke for appropriateness for an Inpatient Acute Rehab Consult per PT recommendation.   At this time, we are recommending Inpatient Rehab consult. Please place order for consult if pt would like to be considered for admit. Please advise.  Cleatrice Burke 06/12/2018, 6:30 PM  I can be reached at [phone].

## 2018-06-12 NOTE — Discharge Instructions (Signed)

## 2018-06-12 NOTE — Plan of Care (Signed)

## 2018-06-13 DIAGNOSIS — D649 Anemia, unspecified: Secondary | ICD-10-CM

## 2018-06-13 DIAGNOSIS — E876 Hypokalemia: Secondary | ICD-10-CM

## 2018-06-13 DIAGNOSIS — R739 Hyperglycemia, unspecified: Secondary | ICD-10-CM

## 2018-06-13 DIAGNOSIS — D696 Thrombocytopenia, unspecified: Secondary | ICD-10-CM

## 2018-06-13 LAB — BASIC METABOLIC PANEL
Anion gap: 9 (ref 5–15)
BUN: 23 mg/dL (ref 8–23)
CO2: 28 mmol/L (ref 22–32)
Calcium: 9.5 mg/dL (ref 8.9–10.3)
Chloride: 95 mmol/L — ABNORMAL LOW (ref 98–111)
Creatinine, Ser: 0.95 mg/dL (ref 0.44–1.00)
GFR calc Af Amer: 60 mL/min — ABNORMAL LOW (ref >60)
GFR calc non Af Amer: 52 mL/min — ABNORMAL LOW (ref >60)
Glucose, Bld: 110 mg/dL — ABNORMAL HIGH (ref 70–99)
POTASSIUM: 3 mmol/L — AB (ref 3.5–5.1)
Sodium: 132 mmol/L — ABNORMAL LOW (ref 135–145)

## 2018-06-13 LAB — CBC
HCT: 35.6 % — ABNORMAL LOW (ref 36.0–46.0)
Hemoglobin: 11.8 g/dL — ABNORMAL LOW (ref 12.0–15.0)
MCH: 31.8 pg (ref 26.0–34.0)
MCHC: 33.1 g/dL (ref 30.0–36.0)
MCV: 96 fL (ref 80.0–100.0)
Platelets: 145 10*3/uL — ABNORMAL LOW (ref 150–400)
RBC: 3.71 MIL/uL — ABNORMAL LOW (ref 3.87–5.11)
RDW: 12.9 % (ref 11.5–15.5)
WBC: 10.7 10*3/uL — ABNORMAL HIGH (ref 4.0–10.5)
nRBC: 0 % (ref 0.0–0.2)

## 2018-06-13 LAB — MAGNESIUM: Magnesium: 1.9 mg/dL (ref 1.7–2.4)

## 2018-06-13 MED ORDER — POTASSIUM CHLORIDE CRYS ER 20 MEQ PO TBCR
40.0000 meq | EXTENDED_RELEASE_TABLET | ORAL | Status: AC
  Administered 2018-06-13 (×2): 40 meq via ORAL
  Filled 2018-06-13 (×2): qty 2

## 2018-06-13 NOTE — Progress Notes (Signed)
PROGRESS NOTE  Jessica Bruce HGD:[id number] DOB: 1923-03-18 DOA: 06/11/2018 PCP: [facility]   LOS: 2 days   Brief Narrative / Interim history: 84 year old female with history of hypertension, hyperlipidemia, anxiety, osteoporosis presenting with mechanical fall and subsequent right hip fracture and right clavicular fracture.  Status post right hip hemiarthroplasty on 11/29.  Subjective: No major events overnight.  Only complaint is not getting cleaned up and staff not responding to her needs in timely manner.  Denies chest pain or dyspnea.  Pain is well controlled.  Has not had a bowel movement yet.  Reports passing some gas.  Tolerating food well.  Both patient and daughter interested in [place].  Assessment & Plan: Principal Problem:   Closed right hip fracture, initial encounter [facility]) Active Problems:   Hypertension   Hyperglycemia   Right clavicle fracture  Mechanical fall/right hip fracture: Status post right hip hemiarthroplasty on 11/29. -Orthopedic surgery managing -Pain management-well controlled -Encourage incentive spirometry -Wean oxygen -Bowel regimen while on pain medication. -PT eval- CIR. CIR consult placed.  History of osteoporosis -Vitamin D and calcium supplementation -Outpatient follow-up  Subacute right clavicular fracture -Pain management -Arm sling  Hypertension: Normotensive -Holding home losartan in the setting of mild creatinine elevation -Increasing home Coreg to 6.25 mg twice daily  Leukocytosis: Stress-induced.  Improved. -Continue trending  Mild creatinine elevation: Improved.  Hold on losartan -Continue trending  Hypokalemia -Replace and recheck -Check magnesium  Normocytic anemia: Combination of blood loss from recent surgery, blood draws and dilution -Continue monitoring.  Patient is on high-dose aspirin  Borderline thrombocytopenia -Continue monitoring  Scheduled Meds: . amLODipine  10 mg Oral Q  supper  . aspirin EC  325 mg Oral Q breakfast  . calcium carbonate  400 mg of elemental calcium Oral Daily  . carvedilol  6.25 mg Oral BID WC  . cholecalciferol  1,000 Units Oral Daily  . docusate sodium  100 mg Oral BID  . docusate sodium  100 mg Oral BID  . escitalopram  5 mg Oral QHS  . feeding supplement  237 mL Oral Q24H  . hydrALAZINE  10 mg Oral BID  . hydrochlorothiazide  25 mg Oral Daily  . pantoprazole  40 mg Oral Daily  . traMADol  50 mg Oral Q6H   Continuous Infusions: . lactated ringers Stopped (06/12/18 1121)  . methocarbamol (ROBAXIN) IV    . methocarbamol (ROBAXIN) IV     PRN Meds:.acetaminophen, alum & mag hydroxide-simeth, HYDROcodone-acetaminophen, HYDROcodone-acetaminophen, HYDROcodone-acetaminophen, menthol-cetylpyridinium **OR** phenol, methocarbamol **OR** methocarbamol (ROBAXIN) IV, methocarbamol **OR** methocarbamol (ROBAXIN) IV, metoCLOPramide **OR** metoCLOPramide (REGLAN) injection, morphine injection, morphine injection, morphine injection, ondansetron **OR** ondansetron (ZOFRAN) IV, polyethylene glycol  DVT prophylaxis: High-dose aspirin Code Status: DNR Family Communication: Daughter at bedside. Disposition Plan: Remains inpatient.  Final disposition CIR  Consultants:   Orthopedic surgery  Procedures:   Right hip hemiarthroplasty on 11/29  Antimicrobials:  None  Objective: Vitals:   06/12/18 1551 06/12/18 2107 06/13/18 0549 06/13/18 1611  BP: (!) 122/54 (!) 117/52 (!) 146/48 (!) 124/46  Pulse: 75 66 66 62  Resp:      Temp: 98.3 F (36.8 C) 97.9 F (36.6 C) 98.2 F (36.8 C) 97.8 F (36.6 C)  TempSrc: Oral Oral Oral Oral  SpO2: 94% 91% 90% (!) 88%  Weight:      Height:        Intake/Output Summary (Last 24 hours) at 06/13/2018 1626 Last data filed at 06/12/2018 2000 Gross per 24 hour  Intake  53.32 ml  Output 300 ml  Net -246.68 ml   Filed Weights   06/11/18 1219  Weight: 61.2 kg    Examination:  GENERAL: Appears well. No  acute distress.  HEENT: MMM.  Vision and Hearing grossly intact.  NECK: Supple.  No JVD.  LUNGS: [place] in place.  Fair air movement bilaterally HEART:  RRR. Heart sounds normal.  ABD: Bowel sounds present. Soft. Non tender.  MSK/EXT:   no edema bilaterally.  Tenderness over lateral aspect of right clavicle.  No steps off SKIN: no apparent skin lesion.  Surgical wound appears clean with no surrounding swelling no significant erythema.  Dressing in place NEURO: Awake, alert and oriented appropriately.  No gross deficit.  PSYCH: Calm. Normal affect.   Data Reviewed: I have independently reviewed following labs and imaging studies   CBC: Recent Labs  Lab 06/11/18 1239 06/12/18 0430 06/13/18 0647  WBC 15.1* 12.4* 10.7*  HGB 15.4* 12.8 11.8*  HCT 46.2* 38.1 35.6*  MCV 95.3 95.0 96.0  PLT 181 165 371*   Basic Metabolic Panel: Recent Labs  Lab 06/11/18 1239 06/12/18 0430 06/13/18 0647  NA 134* 133* 132*  K 3.4* 3.9 3.0*  CL 98 97* 95*  CO2 23 24 28   GLUCOSE 115* 128* 110*  BUN 18 19 23   CREATININE 0.81 1.05* 0.95  CALCIUM 10.2 9.6 9.5   GFR: Estimated Creatinine Clearance: 30.3 mL/min (by C-G formula based on SCr of 0.95 mg/dL). Liver Function Tests: No results for input(s): AST, ALT, ALKPHOS, BILITOT, PROT, ALBUMIN in the last 168 hours. No results for input(s): LIPASE, AMYLASE in the last 168 hours. No results for input(s): AMMONIA in the last 168 hours. Coagulation Profile: Recent Labs  Lab 06/11/18 1325  INR 0.94   Cardiac Enzymes: No results for input(s): CKTOTAL, CKMB, CKMBINDEX, TROPONINI in the last 168 hours. BNP (last 3 results) No results for input(s): PROBNP in the last 8760 hours. HbA1C: No results for input(s): HGBA1C in the last 72 hours. CBG: No results for input(s): GLUCAP in the last 168 hours. Lipid Profile: No results for input(s): CHOL, HDL, LDLCALC, TRIG, CHOLHDL, LDLDIRECT in the last 72 hours. Thyroid Function Tests: No results for  input(s): TSH, T4TOTAL, FREET4, T3FREE, THYROIDAB in the last 72 hours. Anemia Panel: No results for input(s): VITAMINB12, FOLATE, FERRITIN, TIBC, IRON, RETICCTPCT in the last 72 hours. Urine analysis:    Component Value Date/Time   COLORURINE YELLOW 11/01/2016 1815   APPEARANCEUR CLEAR 11/01/2016 1815   LABSPEC 1.020 11/01/2016 1815   PHURINE 6.0 11/01/2016 1815   GLUCOSEU NEGATIVE 11/01/2016 1815   HGBUR NEGATIVE 11/01/2016 1815   BILIRUBINUR NEGATIVE 11/01/2016 1815   KETONESUR NEGATIVE 11/01/2016 1815   PROTEINUR NEGATIVE 11/01/2016 1815   UROBILINOGEN 0.2 11/28/2007 1817   NITRITE NEGATIVE 11/01/2016 1815   LEUKOCYTESUR NEGATIVE 11/01/2016 1815   Sepsis Labs: Invalid input(s): PROCALCITONIN, LACTICIDVEN  No results found for this or any previous visit (from the past 240 hour(s)).    Radiology Studies: No results found.    Taye T. [facility] Pager [phone]  If 7PM-7AM, please contact night-coverage [email or web address] Password [facility] 06/13/2018, 4:26 PM

## 2018-06-13 NOTE — Transfer of Care (Signed)
Immediate Anesthesia Transfer of Care Note  Patient: KAIMANA NEUZIL  Procedure(s) Performed: Right hip hemiarthroplasty direct anterior (Right Hip)  Patient Location: PACU  Anesthesia Type:General  Level of Consciousness: awake, alert  and patient cooperative  Airway & Oxygen Therapy: Patient Spontanous Breathing and Patient connected to nasal cannula oxygen  Post-op Assessment: Report given to RN and Post -op Vital signs reviewed and stable  Post vital signs: Reviewed and stable  Last Vitals:  Vitals Value Taken Time  BP    Temp    Pulse    Resp    SpO2      Last Pain:  Vitals:   06/13/18 0816  TempSrc:   PainSc: 5       Patients Stated Pain Goal: 0 (24/58/09 9833)  Complications: No apparent anesthesia complications

## 2018-06-13 NOTE — Progress Notes (Signed)
CSW received a call from pt's unit admin stating family would like to speak to social work.  CSW notes [facility] is pending and pt/pt's family wants [name], neither of which has "accepted" pt for review, via the hub.  CSW will continue to follow for D/C needs.  Alphonse Guild. Sherif Millspaugh, LCSW, LCAS, CSI Clinical Social Worker Ph: [phone]

## 2018-06-13 NOTE — Progress Notes (Signed)
Pt c/o not getting a bath today. Pt educated that she was offered a bath and she refused bath from [place] states she would like a female to assist her. Pt educated a female will be avail to help her but not at this exact time. Educated pt that she needs to do as much as she can to maintain her independence and she will be set up for a bath, however, she needs to assist with her ADL's. Pt verbalizes understanding. Pt's daughter states that "I'm not trained in providing a bath, so I can't help". Pt and family again educated that the pt needs to perform the majority of her own ADL's to maintain her independence and if she is unable then staff will assist her, but it may be a short time before a staff member is available. Pt and family member verbalize understanding. CN notified of same.

## 2018-06-13 NOTE — Anesthesia Postprocedure Evaluation (Signed)
Anesthesia Post Note  Patient: Jessica Bruce  Procedure(s) Performed: Right hip hemiarthroplasty direct anterior (Right Hip)     Patient location during evaluation: PACU Anesthesia Type: General Level of consciousness: awake and alert Pain management: pain level controlled Vital Signs Assessment: post-procedure vital signs reviewed and stable Respiratory status: spontaneous breathing, nonlabored ventilation, respiratory function stable and patient connected to nasal cannula oxygen Cardiovascular status: blood pressure returned to baseline and stable Postop Assessment: no apparent nausea or vomiting Anesthetic complications: no    Last Vitals:  Vitals:   06/12/18 2107 06/13/18 0549  BP: (!) 117/52 (!) 146/48  Pulse: 66 66  Resp:    Temp: 36.6 C 36.8 C  SpO2: 91% 90%    Last Pain:  Vitals:   06/13/18 0916  TempSrc:   PainSc: 2                  [name] S

## 2018-06-13 NOTE — Plan of Care (Signed)
  Problem: Education: Goal: Knowledge of General Education information will improve Description: Including pain rating scale, medication(s)/side effects and non-pharmacologic comfort measures Outcome: Progressing   Problem: Health Behavior/Discharge Planning: Goal: Ability to manage health-related needs will improve Outcome: Progressing   Problem: Clinical Measurements: Goal: Ability to maintain clinical measurements within normal limits will improve Outcome: Progressing   Problem: Activity: Goal: Risk for activity intolerance will decrease Outcome: Progressing   Problem: Nutrition: Goal: Adequate nutrition will be maintained Outcome: Progressing   Problem: Coping: Goal: Level of anxiety will decrease Outcome: Progressing   Problem: Elimination: Goal: Will not experience complications related to urinary retention Outcome: Progressing   Problem: Pain Managment: Goal: General experience of comfort will improve Outcome: Progressing   Problem: Safety: Goal: Ability to remain free from injury will improve Outcome: Progressing   Problem: Skin Integrity: Goal: Risk for impaired skin integrity will decrease Outcome: Progressing   

## 2018-06-13 NOTE — Clinical Social Work Note (Signed)
Clinical Social Work Assessment  Patient Details  Name: Jessica Bruce MRN: [id number] Date of Birth: 02/14/1923  Date of referral:  06/13/18               Reason for consult:  Facility Placement                Permission sought to share information with:  Facility Contact Representative Permission granted to share information::  Yes, Verbal Permission Granted  Name::        Agency::     Relationship::     Contact Information:     Housing/Transportation Living arrangements for the past 2 months:  Single Family Home Source of Information:  Patient, Adult Children(Grandson) Patient Interpreter Needed:  None Criminal Activity/Legal Involvement Pertinent to Current Situation/Hospitalization:    Significant Relationships:  Adult Children, Other Family Members Lives with:  Self, Spouse Do you feel safe going back to the place where you live?  No Need for family participation in patient care:  Yes (Comment)  Care giving concerns:  CSW met with pt/pt's family who state they desire placement SNF referrals only be sent to:  [name].    Upon speaking to family again after providing pt/pt's family with Medicare.gov SNF list, family states they are "reconsidering" placement to [name] after reading the reviews.  CSW offered to send out referrals via the hub to the [place] area SNF's and counseled pt/pt's family on how on the day of DC it is a real possibility that if pt is not accepted to the facility referrals are sent out to that pt may be D/C'd home while awaiting placement at their preferred facilities and pt/pt's family dismissed this saying, "No, she can't go home".  CSW again offered to refer pt out to a longer list of facilities and was told no.  CSW notes in chart pt was referred to:  [name]  CSW will refer pt to:   [facility].   Of note:  FAMILY/PT STATE THEIR PREFERENCE IS CIR.  CSW notes in chart PT recoemmendation is for CIR.  Social Worker assessment / plan:  CSW met with pt/family and confirmed pt's plan to be discharged to SNF for rehab at discharge if pt does not qualify for CIR, but that pt/family preference is for CIR.  CSW provided active listening and validated pt's/pt's family's concerns.   CSW will complete FL-2 and send referrals out to the other two preferred SNF facilities via the hub per pt's request.  Pt has been living independently prior to being admitted to [facility].  Employment status:  Retired Insurance information:  Medicare PT Recommendations:  Not assessed at this time Information / Referral to community resources:     Patient/Family's Response to care:  Patient alert and oriented.  Patient and pt's family agreeable to plan.  Pt's pt's family supportive and strongly involved in pt.'s care.  Pt.'s pt's family pleasant and appreciated CSW intervention.    Patient/Family's Understanding of and Emotional Response to Diagnosis, Current Treatment, and Prognosis:  Pt and family understand current prognosis and treatment.  Emotional Assessment Appearance:  Appears stated age Attitude/Demeanor/Rapport:    Affect (typically observed):  Accepting, Adaptable, Calm, Pleasant Orientation:  Oriented to Self, Oriented to Situation, Oriented to Place, Oriented to  Time Alcohol / Substance use:    Psych involvement (Current and /or in the community):       Discharge Needs  Concerns to be addressed:  No discharge needs identified Readmission within the last 30 days:  No Current discharge risk:  None Barriers to Discharge:  No Barriers Identified   Jonathan F Riffey, LCSWA 06/13/2018, 2:27 PM  

## 2018-06-13 NOTE — Progress Notes (Signed)
Orthopedic Tech Progress Note Patient Details:  Jessica Bruce March 18, 1923 [id number]  Ortho Devices Type of Ortho Device: Arm sling Ortho Device/Splint Interventions: Application   Post Interventions Patient Tolerated: Well Instructions Provided: Care of device   [place] Pink 06/13/2018, 4:23 PM

## 2018-06-13 NOTE — Progress Notes (Signed)
Physical Therapy Treatment Patient Details Name: Jessica Bruce MRN: [id number] DOB: 07-07-1923 Today's Date: 06/13/2018    History of Present Illness Pt s/p fall in which she sustained R hip fx and R clavicle fx. Underwent R hip hemiarthoplasty on 06/11/18.  Per Dr. Rush Farmer, [place] to gently weight bear on UE.  WBAT LE.    PT Comments    Patient seen for mobility progression. Pt tolerated increased gait distance this session with min A and use of RW. Pt able to increase weight bearing on R LE and with c/o 4/10 pain with mobility. Pt reports most pain in R shoulder with flexion. Daughter present throughout session. Current plan remains appropriate.    Follow Up Recommendations  CIR     Equipment Recommendations  Rolling walker with 5" wheels;3in1 (PT)    Recommendations for Other Services Rehab consult     Precautions / Restrictions Precautions Precautions: Fall Restrictions Weight Bearing Restrictions: Yes RUE Weight Bearing: Weight bearing as tolerated RLE Weight Bearing: Weight bearing as tolerated    Mobility  Bed Mobility Overal bed mobility: Needs Assistance Bed Mobility: Supine to Sit     Supine to sit: Min guard     General bed mobility comments: min guard for safety; increased time and effort; use of rail   Transfers Overall transfer level: Needs assistance Equipment used: Rolling walker (2 wheeled) Transfers: Sit to/from Stand Sit to Stand: Mod assist;Min assist         General transfer comment: assist to power up into standing; increased assist needed from EOB   Ambulation/Gait Ambulation/Gait assistance: Min assist Gait Distance (Feet): (40 ft X 2 trials with seated break) Assistive device: Rolling walker (2 wheeled) Gait Pattern/deviations: Decreased stride length;Step-through pattern Gait velocity: decreased   General Gait Details: improved weight bearing on R LE and increased stride length this session; cues for posture and proximity to RW;  seated break due to pt c/o nausea   Stairs             Wheelchair Mobility    Modified Rankin (Stroke Patients Only)       Balance Overall balance assessment: Needs assistance Sitting-balance support: Feet supported;Bilateral upper extremity supported Sitting balance-Leahy Scale: Fair     Standing balance support: Bilateral upper extremity supported;During functional activity Standing balance-Leahy Scale: Poor                              Cognition Arousal/Alertness: Awake/alert Behavior During Therapy: WFL for tasks assessed/performed Overall Cognitive Status: Within Functional Limits for tasks assessed                                        Exercises      General Comments General comments (skin integrity, edema, etc.): daughter present throughout       Pertinent Vitals/Pain Pain Assessment: 0-10 Pain Score: 4  Pain Location: R hip and shoulder with mobility (grossly LE) Pain Descriptors / Indicators: Discomfort;Sore Pain Intervention(s): Limited activity within patient's tolerance;Monitored during session;Repositioned;Premedicated before session    Home Living                      Prior Function            PT Goals (current goals can now be found in the care plan section) Acute Rehab PT Goals Patient Stated  Goal: get back to [name] Progress towards PT goals: Progressing toward goals    Frequency    Min 4X/week      PT Plan Current plan remains appropriate    Co-evaluation              AM-PAC PT "6 Clicks" Mobility   Outcome Measure  Help needed turning from your back to your side while in a flat bed without using bedrails?: A Little Help needed moving from lying on your back to sitting on the side of a flat bed without using bedrails?: A Little Help needed moving to and from a bed to a chair (including a wheelchair)?: A Lot Help needed standing up from a chair using your arms (e.g.,  wheelchair or bedside chair)?: A Lot Help needed to walk in hospital room?: A Little Help needed climbing 3-5 steps with a railing? : Total 6 Click Score: 14    End of Session Equipment Utilized During Treatment: Gait belt Activity Tolerance: Patient tolerated treatment well Patient left: in chair;with chair alarm set;with call bell/phone within reach;with family/visitor present Nurse Communication: Mobility status PT Visit Diagnosis: Unsteadiness on feet (R26.81);Other abnormalities of gait and mobility (R26.89);Muscle weakness (generalized) (M62.81);Pain Pain - Right/Left: Right Pain - part of body: Shoulder     Time: 0076-2263 PT Time Calculation (min) (ACUTE ONLY): 32 min  Charges:  $Gait Training: 23-37 mins                     Earney Navy, PTA Acute Rehabilitation Services Pager: [phone] Office: [phone]     Darliss Cheney 06/13/2018, 1:25 PM

## 2018-06-14 ENCOUNTER — Other Ambulatory Visit: Payer: Self-pay

## 2018-06-14 ENCOUNTER — Encounter (HOSPITAL_COMMUNITY): Payer: Self-pay | Admitting: Physical Medicine and Rehabilitation

## 2018-06-14 DIAGNOSIS — S42034A Nondisplaced fracture of lateral end of right clavicle, initial encounter for closed fracture: Secondary | ICD-10-CM

## 2018-06-14 DIAGNOSIS — I10 Essential (primary) hypertension: Secondary | ICD-10-CM

## 2018-06-14 DIAGNOSIS — D62 Acute posthemorrhagic anemia: Secondary | ICD-10-CM

## 2018-06-14 DIAGNOSIS — K5903 Drug induced constipation: Secondary | ICD-10-CM

## 2018-06-14 DIAGNOSIS — F419 Anxiety disorder, unspecified: Secondary | ICD-10-CM

## 2018-06-14 DIAGNOSIS — E871 Hypo-osmolality and hyponatremia: Secondary | ICD-10-CM

## 2018-06-14 DIAGNOSIS — S72001A Fracture of unspecified part of neck of right femur, initial encounter for closed fracture: Principal | ICD-10-CM

## 2018-06-14 LAB — CBC
HCT: 33.1 % — ABNORMAL LOW (ref 36.0–46.0)
Hemoglobin: 10.9 g/dL — ABNORMAL LOW (ref 12.0–15.0)
MCH: 31.8 pg (ref 26.0–34.0)
MCHC: 32.9 g/dL (ref 30.0–36.0)
MCV: 96.5 fL (ref 80.0–100.0)
PLATELETS: 163 10*3/uL (ref 150–400)
RBC: 3.43 MIL/uL — AB (ref 3.87–5.11)
RDW: 13 % (ref 11.5–15.5)
WBC: 10 10*3/uL (ref 4.0–10.5)
nRBC: 0 % (ref 0.0–0.2)

## 2018-06-14 LAB — BASIC METABOLIC PANEL
Anion gap: 7 (ref 5–15)
BUN: 26 mg/dL — ABNORMAL HIGH (ref 8–23)
CO2: 27 mmol/L (ref 22–32)
Calcium: 9.6 mg/dL (ref 8.9–10.3)
Chloride: 95 mmol/L — ABNORMAL LOW (ref 98–111)
Creatinine, Ser: 0.91 mg/dL (ref 0.44–1.00)
GFR calc Af Amer: >60 mL/min (ref >60)
GFR calc non Af Amer: 54 mL/min — ABNORMAL LOW (ref >60)
Glucose, Bld: 116 mg/dL — ABNORMAL HIGH (ref 70–99)
POTASSIUM: 5 mmol/L (ref 3.5–5.1)
Sodium: 129 mmol/L — ABNORMAL LOW (ref 135–145)

## 2018-06-14 LAB — SODIUM, URINE, RANDOM: Sodium, Ur: 18 mmol/L

## 2018-06-14 LAB — CREATININE, URINE, RANDOM: Creatinine, Urine: 126.16 mg/dL

## 2018-06-14 LAB — OSMOLALITY, URINE: Osmolality, Ur: 724 mosm/kg (ref 300–900)

## 2018-06-14 MED ORDER — HYDROCODONE-ACETAMINOPHEN 5-325 MG PO TABS: 1.0000 | ORAL_TABLET | Freq: Four times a day (QID) | ORAL | 0 refills | Status: DC | PRN

## 2018-06-14 MED ORDER — CARVEDILOL 3.125 MG PO TABS
3.1250 mg | ORAL_TABLET | Freq: Two times a day (BID) | ORAL | Status: DC
  Administered 2018-06-14 – 2018-06-15 (×4): 3.125 mg via ORAL
  Filled 2018-06-14 (×4): qty 1

## 2018-06-14 MED ORDER — ASPIRIN 325 MG PO TBEC: 325.0000 mg | DELAYED_RELEASE_TABLET | Freq: Every day | ORAL | 0 refills | Status: DC

## 2018-06-14 NOTE — Progress Notes (Signed)
Patient ID: Jessica Bruce, female   DOB: [birth year], 84 y.o.   MRN: [id number] Has been up with PT and OT and making good progress with her mobility.  Her vitals and H/H are stable.  Her right hip is also stable.  Can go to rehab vs skilled nursing from ortho standpoint.

## 2018-06-14 NOTE — Progress Notes (Signed)
PROGRESS NOTE  Jessica Bruce EHU:[id number] DOB: 17-Oct-1922 DOA: 06/11/2018 PCP: [facility]   LOS: 3 days   Brief Narrative / Interim history: 84 year old female with history of hypertension, hyperlipidemia, anxiety, osteoporosis presenting with mechanical fall and subsequent right hip fracture and right clavicular fracture.  Status post right hip hemiarthroplasty on 11/29.  Evaluated by physical therapy who recommended CIR.  Subjective: No major events overnight.  Her main complaint this morning is discomfort in hospital bed.  Pain is well controlled.  Has not had bowel movement yet.  Denies chest pain, shortness of breath, abdominal pain, nausea, vomiting or dysuria.  Assessment & Plan: Principal Problem:   Closed right hip fracture, initial encounter [facility]) Active Problems:   Hypertension   Hyperglycemia   Right clavicle fracture  Mechanical fall/right hip fracture: Status post right hip hemiarthroplasty on 11/29. -Orthopedic surgery managing -Pain management-well controlled -Encourage incentive spirometry -Bowel regimen while on pain medication. -PT eval- CIR.  History of osteoporosis -Vitamin D and calcium supplementation -Outpatient follow-up  Hyponatremia: Sodium 129.  Baseline low 130s. -Urine osmolality and electrolytes -Monitor intake and output and daily weight.  Subacute right clavicular fracture -Pain management -Arm sling  Hypertension: Fairly controlled. -Continue home Coreg and amlodipine -Resume losartan if needed.  Leukocytosis: Stress-induced.  Improved. -Continue trending  Mild creatinine elevation: Resolved.  -Hold  losartan  Hypokalemia: Resolved.  Normocytic anemia: combination of blood loss from recent surgery, blood draws and dilution.  Some ecchymosis but no signs of significant hematoma around surgical site. -Continue monitoring.  -FOBT  Borderline thrombocytopenia: Stable -Continue monitoring  Scheduled  Meds: . amLODipine  10 mg Oral Q supper  . aspirin EC  325 mg Oral Q breakfast  . calcium carbonate  400 mg of elemental calcium Oral Daily  . carvedilol  3.125 mg Oral BID WC  . cholecalciferol  1,000 Units Oral Daily  . docusate sodium  100 mg Oral BID  . docusate sodium  100 mg Oral BID  . escitalopram  5 mg Oral QHS  . feeding supplement  237 mL Oral Q24H  . hydrALAZINE  10 mg Oral BID  . hydrochlorothiazide  25 mg Oral Daily  . pantoprazole  40 mg Oral Daily  . traMADol  50 mg Oral Q6H   Continuous Infusions: . lactated ringers Stopped (06/12/18 1121)  . methocarbamol (ROBAXIN) IV    . methocarbamol (ROBAXIN) IV     PRN Meds:.acetaminophen, alum & mag hydroxide-simeth, HYDROcodone-acetaminophen, HYDROcodone-acetaminophen, HYDROcodone-acetaminophen, menthol-cetylpyridinium **OR** phenol, methocarbamol **OR** methocarbamol (ROBAXIN) IV, methocarbamol **OR** methocarbamol (ROBAXIN) IV, metoCLOPramide **OR** metoCLOPramide (REGLAN) injection, morphine injection, morphine injection, morphine injection, ondansetron **OR** ondansetron (ZOFRAN) IV, polyethylene glycol  DVT prophylaxis: High-dose aspirin Code Status: DNR Family Communication: No family member at bedside. Disposition Plan: Remains inpatient.  Final disposition CIR  Consultants:   Orthopedic surgery  Procedures:   Right hip hemiarthroplasty on 11/29  Antimicrobials:  None  Objective: Vitals:   06/13/18 2200 06/14/18 0200 06/14/18 0500 06/14/18 0825  BP: (!) 121/49  (!) 132/43   Pulse: 63  (!) 58 70  Resp:  16    Temp: 97.8 F (36.6 C)  98.4 F (36.9 C)   TempSrc: Oral  Oral   SpO2: 97%  93%   Weight:      Height:        Intake/Output Summary (Last 24 hours) at 06/14/2018 1301 Last data filed at 06/14/2018 0857 Gross per 24 hour  Intake 370 ml  Output -  Net  370 ml   Filed Weights   06/11/18 1219  Weight: 61.2 kg    Examination:   GENERAL: Appears well. No acute distress.  HEENT: MMM.   Vision and Hearing grossly intact.  NECK: Supple.  No JVD.  LUNGS:  No IWOB. Good air movement. CTAB.  HEART:  RRR. Heart sounds normal.  ABD: Bowel sounds present. Soft. Non tender.  MSK/EXT:   no edema bilaterally.  Mild tenderness over lateral aspect of right clavicle.  No steps of. SKIN: Mild jaundice over right shoulder from prior ecchymosis.  Surgical wound appears clean with no surrounding swelling or erythema.  Mild ecchymosis but no signs of hematoma.  Dressing in place. NEURO: Awake, alert and oriented appropriately.  No gross deficit.  PSYCH: Calm. Normal affect.   Data Reviewed: I have independently reviewed following labs and imaging studies   CBC: Recent Labs  Lab 06/11/18 1239 06/12/18 0430 06/13/18 0647 06/14/18 0242  WBC 15.1* 12.4* 10.7* 10.0  HGB 15.4* 12.8 11.8* 10.9*  HCT 46.2* 38.1 35.6* 33.1*  MCV 95.3 95.0 96.0 96.5  PLT 181 165 145* 619   Basic Metabolic Panel: Recent Labs  Lab 06/11/18 1239 06/12/18 0430 06/13/18 0647 06/14/18 0242  NA 134* 133* 132* 129*  K 3.4* 3.9 3.0* 5.0  CL 98 97* 95* 95*  CO2 23 24 28 27   GLUCOSE 115* 128* 110* 116*  BUN 18 19 23  26*  CREATININE 0.81 1.05* 0.95 0.91  CALCIUM 10.2 9.6 9.5 9.6  MG  --   --  1.9  --    GFR: Estimated Creatinine Clearance: 31.6 mL/min (by C-G formula based on SCr of 0.91 mg/dL). Liver Function Tests: No results for input(s): AST, ALT, ALKPHOS, BILITOT, PROT, ALBUMIN in the last 168 hours. No results for input(s): LIPASE, AMYLASE in the last 168 hours. No results for input(s): AMMONIA in the last 168 hours. Coagulation Profile: Recent Labs  Lab 06/11/18 1325  INR 0.94   Cardiac Enzymes: No results for input(s): CKTOTAL, CKMB, CKMBINDEX, TROPONINI in the last 168 hours. BNP (last 3 results) No results for input(s): PROBNP in the last 8760 hours. HbA1C: No results for input(s): HGBA1C in the last 72 hours. CBG: No results for input(s): GLUCAP in the last 168 hours. Lipid  Profile: No results for input(s): CHOL, HDL, LDLCALC, TRIG, CHOLHDL, LDLDIRECT in the last 72 hours. Thyroid Function Tests: No results for input(s): TSH, T4TOTAL, FREET4, T3FREE, THYROIDAB in the last 72 hours. Anemia Panel: No results for input(s): VITAMINB12, FOLATE, FERRITIN, TIBC, IRON, RETICCTPCT in the last 72 hours. Urine analysis:    Component Value Date/Time   COLORURINE YELLOW 11/01/2016 1815   APPEARANCEUR CLEAR 11/01/2016 1815   LABSPEC 1.020 11/01/2016 1815   PHURINE 6.0 11/01/2016 1815   GLUCOSEU NEGATIVE 11/01/2016 1815   HGBUR NEGATIVE 11/01/2016 1815   BILIRUBINUR NEGATIVE 11/01/2016 1815   KETONESUR NEGATIVE 11/01/2016 1815   PROTEINUR NEGATIVE 11/01/2016 1815   UROBILINOGEN 0.2 11/28/2007 1817   NITRITE NEGATIVE 11/01/2016 1815   LEUKOCYTESUR NEGATIVE 11/01/2016 1815   Sepsis Labs: Invalid input(s): PROCALCITONIN, LACTICIDVEN  No results found for this or any previous visit (from the past 240 hour(s)).    Radiology Studies: No results found.  Onyx Schirmer [facility] Pager [phone]  If 7PM-7AM, please contact night-coverage [email or web address] Password [email or web address] 06/14/2018, 1:01 PM

## 2018-06-14 NOTE — Plan of Care (Signed)
  Problem: Elimination: Goal: Will not experience complications related to bowel motility Outcome: Progressing Goal: Will not experience complications related to urinary retention Outcome: Progressing   

## 2018-06-14 NOTE — Evaluation (Addendum)
Occupational Therapy Evaluation Patient Details Name: Jessica Bruce MRN: [id number] DOB: October 10, 1922 Today's Date: 06/14/2018    History of Present Illness Pt s/p fall in which she sustained R hip fx and R clavicle fx. Underwent R hip hemiarthoplasty on 06/11/18.  Per Dr. Rush Farmer, [place] to gently weight bear on UE.  WBAT LE.   Clinical Impression   PTA, pt was independent and living at [facility]; pt not using DME for mobility. Pt currently requiring Min A for ADLs including grooming, dressing, bathing, and toileting and Min A for functional mobility with RW. Pt presenting with decreased ROM at RUE and decreased balance. Pt highly motivated to participate in therapy and return to PLOF. Pt also with good family support. Pt will require further acute OT to facilitate safe dc. Recommend dc to CIR for further OT to optimize safety, independence with ADLs, and return to PLOF.      Follow Up Recommendations  CIR;Supervision/Assistance - 24 hour    Equipment Recommendations  None recommended by OT    Recommendations for Other Services PT consult;Rehab consult     Precautions / Restrictions Precautions Precautions: Fall Restrictions Weight Bearing Restrictions: Yes RUE Weight Bearing: Weight bearing as tolerated RLE Weight Bearing: Weight bearing as tolerated      Mobility Bed Mobility               General bed mobility comments: Pt in recliner upon arrival  Transfers Overall transfer level: Needs assistance Equipment used: Rolling walker (2 wheeled) Transfers: Sit to/from Stand Sit to Stand: Min assist         General transfer comment: Min A for power up and gaining balance.    Balance Overall balance assessment: Needs assistance Sitting-balance support: Feet supported;Bilateral upper extremity supported Sitting balance-Leahy Scale: Fair     Standing balance support: Bilateral upper extremity supported;During functional activity Standing balance-Leahy Scale:  Poor Standing balance comment: reliant on RW for balance                           ADL either performed or assessed with clinical judgement   ADL Overall ADL's : Needs assistance/impaired Eating/Feeding: Independent;Sitting   Grooming: Wash/dry hands;Minimal assistance;Standing Grooming Details (indicate cue type and reason): Min A for safety and balance. Requiring assistance to reach items due to limited ROM at right shoulder Upper Body Bathing: Minimal assistance;Sitting Upper Body Bathing Details (indicate cue type and reason): Due to limited ROM at RUE Lower Body Bathing: Minimal assistance;Sit to/from stand   Upper Body Dressing : Minimal assistance;Sitting Upper Body Dressing Details (indicate cue type and reason): Due to limited ROM at RUE. Donning second gown like a jacket Lower Body Dressing: Minimal assistance;Sit to/from stand Lower Body Dressing Details (indicate cue type and reason): Min A for donning clothing over RLE. Also Min A for standing balance Toilet Transfer: Minimal assistance;Ambulation;RW(Simulated to recliner)           Functional mobility during ADLs: Minimal assistance;Rolling walker General ADL Comments: pt presenting with decreased functional performance and is highly motivated to return to [facility]     Vision         Perception     Praxis      Pertinent Vitals/Pain Pain Assessment: Faces Faces Pain Scale: Hurts little more Pain Location: R hip and shoulder with mobility (grossly LE) Pain Descriptors / Indicators: Discomfort;Sore Pain Intervention(s): Monitored during session;Limited activity within patient's tolerance;Repositioned     Hand Dominance Right  Extremity/Trunk Assessment Upper Extremity Assessment Upper Extremity Assessment: RUE deficits/detail RUE Deficits / Details: Decreased ROM at shoulder. WFL elbow. Decreased grasp strength compared to left hand. 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 decreased ROM and dexerity RUE  Coordination: decreased fine motor;decreased gross motor   Lower Extremity Assessment Lower Extremity Assessment: Defer to PT evaluation;RLE deficits/detail RLE Deficits / Details: limited hip movement due to pain   Cervical / Trunk Assessment Cervical / Trunk Assessment: Kyphotic   Communication Communication Communication: No difficulties   Cognition Arousal/Alertness: Awake/alert Behavior During Therapy: WFL for tasks assessed/performed Overall Cognitive Status: Within Functional Limits for tasks assessed                                     General Comments  Daughter present throughout session    Exercises     Shoulder Instructions      Home Living Family/patient expects to be discharged to:: Private residence(independent living at [facility]) Living Arrangements: Alone Available Help at Discharge: (can pay for personal care attendants) Type of Home: Apartment Home Access: Level entry     Home Layout: One level     Bathroom Shower/Tub: Occupational psychologist: Handicapped height                Prior Functioning/Environment Level of Independence: Independent        Comments: ADLs, IADLs, exercises, driving, and enjoys playing bridge.         OT Problem List: Decreased strength;Decreased range of motion;Decreased activity tolerance;Impaired balance (sitting and/or standing);Decreased knowledge of use of DME or AE;Decreased knowledge of precautions;Pain      OT Treatment/Interventions: Self-care/ADL training;Therapeutic exercise;Energy conservation;DME and/or AE instruction;Therapeutic activities;Patient/family education    OT Goals(Current goals can be found in the care plan section) Acute Rehab OT Goals Patient Stated Goal: "Return to walking without a RW" OT Goal Formulation: With patient Time For Goal Achievement: 06/28/18 Potential to Achieve Goals: Good ADL Goals Pt Will Perform Grooming: with modified  independence;standing Pt Will Perform Upper Body Dressing: with modified independence;sitting Pt Will Perform Lower Body Dressing: with modified independence;sit to/from stand(with or without AE) Pt Will Transfer to Toilet: with modified independence;ambulating;regular height toilet Pt Will Perform Toileting - Clothing Manipulation and hygiene: with modified independence;sit to/from stand  OT Frequency: Min 2X/week   Barriers to D/C:            Co-evaluation              AM-PAC OT "6 Clicks" Daily Activity     Outcome Measure Help from another person eating meals?: None Help from another person taking care of personal grooming?: A Little Help from another person toileting, which includes using toliet, bedpan, or urinal?: A Little Help from another person bathing (including washing, rinsing, drying)?: A Little Help from another person to put on and taking off regular upper body clothing?: A Little Help from another person to put on and taking off regular lower body clothing?: A Little 6 Click Score: 19   End of Session Equipment Utilized During Treatment: Gait belt;Rolling walker Nurse Communication: Mobility status;Precautions;Patient requests pain meds  Activity Tolerance: Patient tolerated treatment well;Patient limited by pain Patient left: in chair;with call bell/phone within reach;with family/visitor present  OT Visit Diagnosis: Unsteadiness on feet (R26.81);Other abnormalities of gait and mobility (R26.89);Muscle weakness (generalized) (M62.81);Pain Pain - Right/Left: Right Pain - part of body: Leg  Time: 0931-1216 OT Time Calculation (min): 26 min Charges:  OT General Charges $OT Visit: 1 Visit OT Evaluation $OT Eval Moderate Complexity: 1 Mod OT Treatments $Self Care/Home Management : 8-22 mins  Demaya Hardge MSOT, OTR/L Acute Rehab Pager: [phone] Office: [place] 06/14/2018, 1:04 PM

## 2018-06-14 NOTE — Consult Note (Signed)
Physical Medicine and Rehabilitation Consult   Reason for Consult: Hip fracture with functional deficits Referring Physician: Dr. Cyndia Skeeters   HPI: Jessica Bruce is a 84 y.o. female with history of HTN, anxiety disorder; who fell down 2 steps the night prior to admission on 06/11/2018 with injury to right shoulder as well as right hip and difficulty weightbearing.  History taken from chart review and family.  She was found to have right femoral neck fracture and a nondisplaced distal right clavicle fracture.  Right clavicle fracture treated conservatively with sling and she was taken to the OR for anterior right hip hemiarthroplasty by Dr. Ninfa Linden.  Postop to be WBAT and on aspirin for DVT prophylaxis.  Postop has had issues with constipation, acute kidney injury as well as acute blood loss anemia.  Therapy evaluations done and CIR recommended due to functional deficits.  Patient lives alone at [facility] independent living. Family refusing SNF and reports that they plan on hiring personal care attendants.   Review of Systems  Constitutional: Negative for chills and fever.  HENT: Negative for hearing loss and tinnitus.   Eyes: Negative for blurred vision and double vision.  Respiratory: Negative for cough and shortness of breath.   Cardiovascular: Negative for chest pain and palpitations.  Gastrointestinal: Positive for diarrhea (chronic). Negative for abdominal pain, heartburn and nausea.  Genitourinary: Negative for dysuria and urgency.       Gets up 2-3 times at nights  Musculoskeletal: Positive for joint pain and myalgias.  Skin: Negative for itching and rash.  Neurological: Positive for dizziness (occasionally). Negative for sensory change and focal weakness.  Psychiatric/Behavioral: The patient is nervous/anxious and has insomnia.   All other systems reviewed and are negative.  Past Medical History:  Diagnosis Date  . Anxiety   . Closed right hip fracture, initial  encounter ([place]) 06/11/2018  . Hyperglycemia   . Hypertension   . Osteoporosis     Past Surgical History:  Procedure Laterality Date  . CHOLECYSTECTOMY    . COLON SURGERY     pre-cancerous polyp very remotely    Family History  Problem Relation Age of Onset  . High blood pressure Mother   . Cancer Brother   . Heart failure Father      Social History:  Lives at [facility] independent living and was independent without AD.  Facility does home management and meals prep. She reports that she has never smoked. She has never used smokeless tobacco. She reports that she drinks about 3.0 standard drinks of alcohol per week. She reports that she does not use drugs.    Allergies  Allergen Reactions  . Azithromycin Nausea And Vomiting  . Penicillins Nausea And Vomiting  . Penicillins Nausea And Vomiting    .Has patient had a PCN reaction causing immediate rash, facial/tongue/throat swelling, SOB or lightheadedness with hypotension: No Has patient had a PCN reaction causing severe rash involving mucus membranes or skin necrosis: No Has patient had a PCN reaction that required hospitalization No Has patient had a PCN reaction occurring within the last 10 years: No If all of the above answers are "NO", then may proceed with Cephalosporin use.    Medications Prior to Admission  Medication Sig Dispense Refill  . amLODipine (NORVASC) 10 MG tablet Take 10 mg by mouth daily with supper.    . Ascorbic Acid (VITAMIN C PO) Take 1 tablet by mouth every morning.    . carvedilol (COREG) 3.125 MG tablet Take  3.125 mg by mouth 2 (two) times daily with a meal.    . escitalopram (LEXAPRO) 5 MG tablet Take 5 mg by mouth at bedtime.    . hydrALAZINE (APRESOLINE) 10 MG tablet Take 10 mg by mouth 2 (two) times daily.    Marland Kitchen losartan-hydrochlorothiazide (HYZAAR) 100-25 MG per tablet Take 1 tablet by mouth daily.    Marland Kitchen VITAMIN D PO Take 1 tablet by mouth every morning.      Home: Home Living Family/patient  expects to be discharged to:: Private residence(independent living at [facility]) Living Arrangements: Alone Available Help at Discharge: (can pay for personal care attendants) Type of Home: Apartment Home Access: Level entry Home Layout: One level  Functional History: Prior Function Level of Independence: Independent Functional Status:  Mobility: Bed Mobility Overal bed mobility: Needs Assistance Bed Mobility: Supine to Sit Supine to sit: Min guard General bed mobility comments: min guard for safety; increased time and effort; use of rail  Transfers Overall transfer level: Needs assistance Equipment used: Rolling walker (2 wheeled) Transfers: Sit to/from Stand Sit to Stand: Mod assist, Min assist General transfer comment: assist to power up into standing; increased assist needed from EOB  Ambulation/Gait Ambulation/Gait assistance: Min assist Gait Distance (Feet): (40 ft X 2 trials with seated break) Assistive device: Rolling walker (2 wheeled) Gait Pattern/deviations: Decreased stride length, Step-through pattern General Gait Details: improved weight bearing on R LE and increased stride length this session; cues for posture and proximity to RW; seated break due to pt c/o nausea Gait velocity: decreased    ADL:    Cognition: Cognition Overall Cognitive Status: Within Functional Limits for tasks assessed Orientation Level: Oriented X4 Cognition Arousal/Alertness: Awake/alert Behavior During Therapy: WFL for tasks assessed/performed Overall Cognitive Status: Within Functional Limits for tasks assessed   Blood pressure (!) 132/43, pulse 70, temperature 98.4 F (36.9 C), temperature source Oral, resp. rate 16, height 5' (1.524 m), weight 61.2 kg, SpO2 93 %. Physical Exam  Nursing note and vitals reviewed. Constitutional: She appears well-developed and well-nourished.  HENT:  Head: Normocephalic and atraumatic.  Eyes: EOM are normal. Right eye exhibits no discharge.  Left eye exhibits no discharge.  Neck: Normal range of motion. Neck supple.  Cardiovascular: Normal rate and regular rhythm.  Respiratory: Effort normal and breath sounds normal.  GI: Soft. She exhibits distension. Bowel sounds are decreased. There is no tenderness.  Musculoskeletal:  Right anterior hip with foam dressing and moderate edema. Resolving ecchymosis right shoulder--able to activate with minimal discomfort.   Neurological: She is alert.  Motor: Right upper extremity 4/5 proximal distal Left upper extremity: 4+/5 proximal distal Right lower; hip flexion, knee extension 3+/5, ankle dorsiflexion 4/5 Left lower extremity: Hip, knee extension 4/5, ankle dorsiflexion 4+/5 Sensation subjectively diminished light touch left  Skin: Skin is warm and dry.  Multiple keratosis BLE. See above  Psychiatric: She has a normal mood and affect. Her behavior is normal. Thought content normal.    Results for orders placed or performed during the hospital encounter of 06/11/18 (from the past 24 hour(s))  CBC     Status: Abnormal   Collection Time: 06/14/18  2:42 AM  Result Value Ref Range   WBC 10.0 4.0 - 10.5 K/uL   RBC 3.43 (L) 3.87 - 5.11 MIL/uL   Hemoglobin 10.9 (L) 12.0 - 15.0 g/dL   HCT 33.1 (L) 36.0 - 46.0 %   MCV 96.5 80.0 - 100.0 fL   MCH 31.8 26.0 - 34.0 pg   MCHC  32.9 30.0 - 36.0 g/dL   RDW 13.0 11.5 - 15.5 %   Platelets 163 150 - 400 K/uL   nRBC 0.0 0.0 - 0.2 %  Basic metabolic panel     Status: Abnormal   Collection Time: 06/14/18  2:42 AM  Result Value Ref Range   Sodium 129 (L) 135 - 145 mmol/L   Potassium 5.0 3.5 - 5.1 mmol/L   Chloride 95 (L) 98 - 111 mmol/L   CO2 27 22 - 32 mmol/L   Glucose, Bld 116 (H) 70 - 99 mg/dL   BUN 26 (H) 8 - 23 mg/dL   Creatinine, Ser 0.91 0.44 - 1.00 mg/dL   Calcium 9.6 8.9 - 10.3 mg/dL   GFR calc non Af Amer 54 (L) >60 mL/min   GFR calc Af Amer >60 >60 mL/min   Anion gap 7 5 - 15   No results found.  Assessment/Plan: Diagnosis:  Right hip fracture s/p right hemiarthroplasty Labs independently reviewed.  Records reviewed and summated above.  1. Does the need for close, 24 hr/day medical supervision in concert with the patient's rehab needs make it unreasonable for this patient to be served in a less intensive setting? Yes  2. Co-Morbidities requiring supervision/potential complications: constipation (increase bowel meds), acute kidney injury (avoid nephrotoxic meds), acute blood loss anemia (repeat labs, transfuse to ensure appropriate perfusion for increased activity tolerance), HTN (monitor and provide prns in accordance with increased physical exertion and pain), anxiety (ensure anxiety and resulting apprehension do not limit functional progress; consider prn medications if warranted), hyponatremia (cont to monitor, treat if necessary) 3. Due to bowel management, skin/wound care, disease management, pain management and patient education, does the patient require 24 hr/day rehab nursing? Yes 4. Does the patient require coordinated care of a physician, rehab nurse, PT (1-2 hrs/day, 5 days/week) and OT (1-2 hrs/day, 5 days/week) to address physical and functional deficits in the context of the above medical diagnosis(es)? Yes Addressing deficits in the following areas: balance, endurance, locomotion, strength, transferring, bowel/bladder control, bathing, dressing, toileting and psychosocial support 5. Can the patient actively participate in an intensive therapy program of at least 3 hrs of therapy per day at least 5 days per week? Yes 6. The potential for patient to make measurable gains while on inpatient rehab is excellent 7. Anticipated functional outcomes upon discharge from inpatient rehab are modified independent and supervision  with PT, modified independent and supervision with OT, n/a with SLP. 8. Estimated rehab length of stay to reach the above functional goals is: 7-10 days. 9. Anticipated D/C setting:  Home 10. Anticipated post D/C treatments: ILF 11. Overall Rehab/Functional Prognosis: excellent and good  RECOMMENDATIONS: This patient's condition is appropriate for continued rehabilitative care in the following setting: CIR if plan is to hire caregiver support to discharge to ILF after bowel movement. Patient has agreed to participate in recommended program. Yes Note that insurance prior authorization may be required for reimbursement for recommended care.  Comment: Rehab Admissions Coordinator to follow up.   I have personally performed a face to face diagnostic evaluation, including, but not limited to relevant history and physical exam findings, of this patient and developed relevant assessment and plan.  Additionally, I have reviewed and concur with the physician assistant's documentation above.   Delice Lesch, MD, ABPMR Bary Leriche, PA-C 06/14/2018

## 2018-06-15 ENCOUNTER — Encounter (HOSPITAL_COMMUNITY): Payer: Self-pay | Admitting: *Deleted

## 2018-06-15 ENCOUNTER — Inpatient Hospital Stay (HOSPITAL_COMMUNITY)
Admission: RE | Admit: 2018-06-15 | Discharge: 2018-06-25 | DRG: 560 | Disposition: A | Discharge status: Home Health | Payer: Medicare Other | Source: Intra-hospital | Attending: Physical Medicine & Rehabilitation | Admitting: Physical Medicine & Rehabilitation

## 2018-06-15 ENCOUNTER — Other Ambulatory Visit: Payer: Self-pay

## 2018-06-15 DIAGNOSIS — D62 Acute posthemorrhagic anemia: Secondary | ICD-10-CM | POA: Diagnosis present

## 2018-06-15 DIAGNOSIS — E441 Mild protein-calorie malnutrition: Secondary | ICD-10-CM | POA: Diagnosis present

## 2018-06-15 DIAGNOSIS — G8918 Other acute postprocedural pain: Secondary | ICD-10-CM

## 2018-06-15 DIAGNOSIS — Z8249 Family history of ischemic heart disease and other diseases of the circulatory system: Secondary | ICD-10-CM | POA: Diagnosis not present

## 2018-06-15 DIAGNOSIS — Z79899 Other long term (current) drug therapy: Secondary | ICD-10-CM

## 2018-06-15 DIAGNOSIS — R110 Nausea: Secondary | ICD-10-CM | POA: Diagnosis present

## 2018-06-15 DIAGNOSIS — M810 Age-related osteoporosis without current pathological fracture: Secondary | ICD-10-CM | POA: Diagnosis present

## 2018-06-15 DIAGNOSIS — S72001A Fracture of unspecified part of neck of right femur, initial encounter for closed fracture: Secondary | ICD-10-CM

## 2018-06-15 DIAGNOSIS — E46 Unspecified protein-calorie malnutrition: Secondary | ICD-10-CM

## 2018-06-15 DIAGNOSIS — Z9049 Acquired absence of other specified parts of digestive tract: Secondary | ICD-10-CM

## 2018-06-15 DIAGNOSIS — F419 Anxiety disorder, unspecified: Secondary | ICD-10-CM | POA: Diagnosis present

## 2018-06-15 DIAGNOSIS — Z6827 Body mass index (BMI) 27.0-27.9, adult: Secondary | ICD-10-CM | POA: Diagnosis not present

## 2018-06-15 DIAGNOSIS — Z881 Allergy status to other antibiotic agents status: Secondary | ICD-10-CM | POA: Diagnosis not present

## 2018-06-15 DIAGNOSIS — Z471 Aftercare following joint replacement surgery: Principal | ICD-10-CM

## 2018-06-15 DIAGNOSIS — S72001D Fracture of unspecified part of neck of right femur, subsequent encounter for closed fracture with routine healing: Secondary | ICD-10-CM | POA: Diagnosis not present

## 2018-06-15 DIAGNOSIS — Z96641 Presence of right artificial hip joint: Secondary | ICD-10-CM | POA: Diagnosis present

## 2018-06-15 DIAGNOSIS — I10 Essential (primary) hypertension: Secondary | ICD-10-CM | POA: Diagnosis present

## 2018-06-15 DIAGNOSIS — S42034S Nondisplaced fracture of lateral end of right clavicle, sequela: Secondary | ICD-10-CM | POA: Diagnosis not present

## 2018-06-15 DIAGNOSIS — Z880 Allergy status to penicillin: Secondary | ICD-10-CM

## 2018-06-15 DIAGNOSIS — G4700 Insomnia, unspecified: Secondary | ICD-10-CM | POA: Diagnosis present

## 2018-06-15 DIAGNOSIS — S42001S Fracture of unspecified part of right clavicle, sequela: Secondary | ICD-10-CM

## 2018-06-15 DIAGNOSIS — R42 Dizziness and giddiness: Secondary | ICD-10-CM | POA: Diagnosis present

## 2018-06-15 DIAGNOSIS — S42001D Fracture of unspecified part of right clavicle, subsequent encounter for fracture with routine healing: Secondary | ICD-10-CM

## 2018-06-15 DIAGNOSIS — W109XXD Fall (on) (from) unspecified stairs and steps, subsequent encounter: Secondary | ICD-10-CM | POA: Diagnosis present

## 2018-06-15 DIAGNOSIS — E8809 Other disorders of plasma-protein metabolism, not elsewhere classified: Secondary | ICD-10-CM

## 2018-06-15 DIAGNOSIS — S72009A Fracture of unspecified part of neck of unspecified femur, initial encounter for closed fracture: Secondary | ICD-10-CM | POA: Diagnosis present

## 2018-06-15 DIAGNOSIS — S72001S Fracture of unspecified part of neck of right femur, sequela: Secondary | ICD-10-CM | POA: Diagnosis not present

## 2018-06-15 DIAGNOSIS — E871 Hypo-osmolality and hyponatremia: Secondary | ICD-10-CM | POA: Diagnosis not present

## 2018-06-15 DIAGNOSIS — K5903 Drug induced constipation: Secondary | ICD-10-CM | POA: Diagnosis not present

## 2018-06-15 DIAGNOSIS — K5900 Constipation, unspecified: Secondary | ICD-10-CM | POA: Diagnosis present

## 2018-06-15 LAB — MAGNESIUM: Magnesium: 1.8 mg/dL (ref 1.7–2.4)

## 2018-06-15 LAB — HEMOGLOBIN AND HEMATOCRIT, BLOOD
HCT: 34.4 % — ABNORMAL LOW (ref 36.0–46.0)
Hemoglobin: 11 g/dL — ABNORMAL LOW (ref 12.0–15.0)

## 2018-06-15 LAB — BASIC METABOLIC PANEL
ANION GAP: 9 (ref 5–15)
BUN: 20 mg/dL (ref 8–23)
CALCIUM: 9.9 mg/dL (ref 8.9–10.3)
CO2: 27 mmol/L (ref 22–32)
Chloride: 96 mmol/L — ABNORMAL LOW (ref 98–111)
Creatinine, Ser: 0.87 mg/dL (ref 0.44–1.00)
GFR calc Af Amer: >60 mL/min (ref >60)
GFR calc non Af Amer: 57 mL/min — ABNORMAL LOW (ref >60)
Glucose, Bld: 106 mg/dL — ABNORMAL HIGH (ref 70–99)
Potassium: 4.6 mmol/L (ref 3.5–5.1)
Sodium: 132 mmol/L — ABNORMAL LOW (ref 135–145)

## 2018-06-15 LAB — CORTISOL-AM, BLOOD: Cortisol - AM: 15.1 ug/dL (ref 6.7–22.6)

## 2018-06-15 MED ORDER — ENOXAPARIN SODIUM 30 MG/0.3ML ~~LOC~~ SOLN
30.0000 mg | SUBCUTANEOUS | Status: DC
  Administered 2018-06-15 – 2018-06-16 (×2): 30 mg via SUBCUTANEOUS
  Filled 2018-06-15 (×2): qty 0.3

## 2018-06-15 MED ORDER — PROCHLORPERAZINE MALEATE 5 MG PO TABS: 5.0000 mg | ORAL_TABLET | Freq: Four times a day (QID) | ORAL | Status: DC | PRN

## 2018-06-15 MED ORDER — PROCHLORPERAZINE EDISYLATE 10 MG/2ML IJ SOLN: 5.0000 mg | Freq: Four times a day (QID) | INTRAMUSCULAR | Status: DC | PRN

## 2018-06-15 MED ORDER — DOCUSATE SODIUM 100 MG PO CAPS
100.0000 mg | ORAL_CAPSULE | Freq: Two times a day (BID) | ORAL | Status: DC
  Administered 2018-06-15 – 2018-06-24 (×18): 100 mg via ORAL
  Filled 2018-06-15 (×20): qty 1

## 2018-06-15 MED ORDER — ALUM & MAG HYDROXIDE-SIMETH 200-200-20 MG/5ML PO SUSP: 30.0000 mL | ORAL | Status: DC | PRN

## 2018-06-15 MED ORDER — TRAMADOL HCL 50 MG PO TABS
50.0000 mg | ORAL_TABLET | Freq: Four times a day (QID) | ORAL | Status: DC
  Administered 2018-06-16 – 2018-06-23 (×31): 50 mg via ORAL
  Filled 2018-06-15 (×31): qty 1

## 2018-06-15 MED ORDER — CALCIUM CARBONATE ANTACID 500 MG PO CHEW: 400.0000 mg | CHEWABLE_TABLET | Freq: Every day | ORAL | 0 refills | Status: DC

## 2018-06-15 MED ORDER — VITAMIN C 500 MG PO TABS
500.0000 mg | ORAL_TABLET | ORAL | Status: DC
  Administered 2018-06-16 – 2018-06-25 (×11): 500 mg via ORAL
  Filled 2018-06-15 (×12): qty 1

## 2018-06-15 MED ORDER — ACETAMINOPHEN 325 MG PO TABS
325.0000 mg | ORAL_TABLET | ORAL | Status: DC | PRN
  Administered 2018-06-20 (×2): 650 mg via ORAL
  Filled 2018-06-15 (×2): qty 2

## 2018-06-15 MED ORDER — DOCUSATE SODIUM 100 MG PO CAPS: 100.0000 mg | ORAL_CAPSULE | Freq: Two times a day (BID) | ORAL | 0 refills | Status: DC

## 2018-06-15 MED ORDER — PROCHLORPERAZINE 25 MG RE SUPP: 12.5000 mg | Freq: Four times a day (QID) | RECTAL | Status: DC | PRN

## 2018-06-15 MED ORDER — POLYETHYLENE GLYCOL 3350 17 G PO PACK: 17.0000 g | PACK | Freq: Every day | ORAL | 0 refills | Status: DC | PRN

## 2018-06-15 MED ORDER — PANTOPRAZOLE SODIUM 40 MG PO TBEC
40.0000 mg | DELAYED_RELEASE_TABLET | Freq: Every day | ORAL | Status: DC
  Administered 2018-06-16 – 2018-06-25 (×10): 40 mg via ORAL
  Filled 2018-06-15 (×10): qty 1

## 2018-06-15 MED ORDER — POLYETHYLENE GLYCOL 3350 17 G PO PACK
17.0000 g | PACK | Freq: Every day | ORAL | Status: DC | PRN
  Administered 2018-06-19: 17 g via ORAL
  Filled 2018-06-15: qty 1

## 2018-06-15 MED ORDER — ENSURE ENLIVE PO LIQD
237.0000 mL | Freq: Two times a day (BID) | ORAL | Status: DC
  Administered 2018-06-17 – 2018-06-24 (×13): 237 mL via ORAL

## 2018-06-15 MED ORDER — DIPHENHYDRAMINE HCL 12.5 MG/5ML PO ELIX
12.5000 mg | ORAL_SOLUTION | Freq: Four times a day (QID) | ORAL | Status: DC | PRN
  Filled 2018-06-15: qty 10

## 2018-06-15 MED ORDER — PANTOPRAZOLE SODIUM 40 MG PO TBEC: 40.0000 mg | DELAYED_RELEASE_TABLET | Freq: Every day | ORAL | 0 refills | Status: DC

## 2018-06-15 MED ORDER — ENSURE SURGERY PO LIQD
237.0000 mL | ORAL | Status: DC
  Administered 2018-06-18 – 2018-06-23 (×4): 237 mL via ORAL
  Filled 2018-06-15 (×10): qty 237

## 2018-06-15 MED ORDER — FLEET ENEMA 7-19 GM/118ML RE ENEM: 1.0000 | ENEMA | Freq: Once | RECTAL | Status: DC | PRN

## 2018-06-15 MED ORDER — ENSURE SURGERY PO LIQD: 237.0000 mL | ORAL | 0 refills | Status: DC

## 2018-06-15 MED ORDER — POLYETHYLENE GLYCOL 3350 17 G PO PACK
17.0000 g | PACK | Freq: Every day | ORAL | Status: DC
  Administered 2018-06-15 – 2018-06-19 (×5): 17 g via ORAL
  Filled 2018-06-15 (×9): qty 1

## 2018-06-15 MED ORDER — GUAIFENESIN-DM 100-10 MG/5ML PO SYRP: 5.0000 mL | ORAL_SOLUTION | Freq: Four times a day (QID) | ORAL | Status: DC | PRN

## 2018-06-15 MED ORDER — HYDROCHLOROTHIAZIDE 25 MG PO TABS: 25.0000 mg | ORAL_TABLET | Freq: Every day | ORAL | 0 refills | Status: DC

## 2018-06-15 MED ORDER — VITAMIN D 25 MCG (1000 UNIT) PO TABS
1000.0000 [IU] | ORAL_TABLET | Freq: Every day | ORAL | Status: DC
  Administered 2018-06-16 – 2018-06-25 (×10): 1000 [IU] via ORAL
  Filled 2018-06-15 (×10): qty 1

## 2018-06-15 MED ORDER — CALCIUM CARBONATE ANTACID 500 MG PO CHEW
400.0000 mg | CHEWABLE_TABLET | Freq: Every day | ORAL | Status: DC
  Administered 2018-06-16 – 2018-06-25 (×10): 400 mg via ORAL
  Filled 2018-06-15 (×10): qty 2

## 2018-06-15 MED ORDER — CARVEDILOL 3.125 MG PO TABS
3.1250 mg | ORAL_TABLET | Freq: Two times a day (BID) | ORAL | Status: DC
  Administered 2018-06-16 – 2018-06-25 (×19): 3.125 mg via ORAL
  Filled 2018-06-15 (×19): qty 1

## 2018-06-15 MED ORDER — PHENOL 1.4 % MT LIQD: 1.0000 | OROMUCOSAL | Status: DC | PRN

## 2018-06-15 MED ORDER — AMLODIPINE BESYLATE 10 MG PO TABS
10.0000 mg | ORAL_TABLET | Freq: Every day | ORAL | Status: DC
  Administered 2018-06-16 – 2018-06-24 (×9): 10 mg via ORAL
  Filled 2018-06-15 (×9): qty 1

## 2018-06-15 MED ORDER — MENTHOL 3 MG MT LOZG: 1.0000 | LOZENGE | OROMUCOSAL | Status: DC | PRN

## 2018-06-15 MED ORDER — MELATONIN 3 MG PO TABS
3.0000 mg | ORAL_TABLET | Freq: Every evening | ORAL | Status: DC | PRN
  Filled 2018-06-15: qty 1

## 2018-06-15 MED ORDER — HYDROCODONE-ACETAMINOPHEN 5-325 MG PO TABS
1.0000 | ORAL_TABLET | ORAL | Status: DC | PRN
  Administered 2018-06-16 (×2): 1 via ORAL
  Filled 2018-06-15 (×2): qty 1

## 2018-06-15 MED ORDER — ESCITALOPRAM OXALATE 10 MG PO TABS
5.0000 mg | ORAL_TABLET | Freq: Every day | ORAL | Status: DC
  Administered 2018-06-15 – 2018-06-24 (×10): 5 mg via ORAL
  Filled 2018-06-15 (×10): qty 1

## 2018-06-15 MED ORDER — BISACODYL 10 MG RE SUPP
10.0000 mg | Freq: Every day | RECTAL | Status: DC | PRN
  Filled 2018-06-15: qty 1

## 2018-06-15 MED ORDER — HYDRALAZINE HCL 10 MG PO TABS
10.0000 mg | ORAL_TABLET | Freq: Two times a day (BID) | ORAL | Status: DC
  Administered 2018-06-16 – 2018-06-21 (×10): 10 mg via ORAL
  Filled 2018-06-15 (×12): qty 1

## 2018-06-15 NOTE — Care Management Important Message (Signed)
Important Message  Patient Details  Name: Jessica Bruce MRN: [id number] Date of Birth: [birth year]   Medicare Important Message Given:  Yes    Orbie Pyo 06/15/2018, 2:12 PM

## 2018-06-15 NOTE — PMR Pre-admission (Signed)
PMR Admission Coordinator Pre-Admission Assessment  Patient: Jessica Bruce is an 84 y.o., female MRN: [id number] DOB: [birth year] Height: 5' (152.4 cm) Weight: 61.2 kg              Insurance Information HMO: No   PPO:       PCP:       IPA:       80/20:       OTHER:   PRIMARY:  Medicare A and B      Policy#: [id number]      Subscriber: patient CM Name:        Phone#:       Fax#:   Pre-Cert#:        Employer:   Benefits:  Phone #:       Name: Checked in [place]. Date: 11/11/89     Deduct: $1364      Out of Pocket Max: None      Life Max: N/A CIR: 100%      SNF: 100 days Outpatient: 80%     Co-Pay: 20% Home Health: 100%      Co-Pay: none DME: 80%     Co-Pay: 20% Providers: patient's choice  SECONDARY:  [employer] [place]      Policy#:        Subscriber:   CM Name:        Phone#:       Fax#:   Pre-Cert#:        Employer:   Benefits:  Phone #:       Name:   Eff. Date:       Deduct:        Out of Pocket Max:        Life Max:   CIR:        SNF:   Outpatient:       Co-Pay:   Home Health:        Co-Pay:   DME:       Co-Pay:    Emergency Contact Information Contact Information    Name Relation Home Work Mobile   Tebbit,[name] Daughter [phone]       Current Medical History  Patient Admitting Diagnosis:  Right hip fracture s/p right hemiarthroplasty  History of Present Illness:  A 84 y.o. female with history of HTN, anxiety disorder; who fell down 2 steps the night prior to admission on 06/11/2018 with injury to right shoulder as well as right hip and difficulty weightbearing.  History taken from chart review and family.  She was found to have right femoral neck fracture and a nondisplaced distal right clavicle fracture.  Right clavicle fracture treated conservatively with sling and she was taken to the OR for anterior right hip hemiarthroplasty by Dr. Ninfa Linden.  Postop to be WBAT and on aspirin for DVT prophylaxis.  Postop has had issues with constipation, acute kidney  injury as well as acute blood loss anemia.  Therapy evaluations done and CIR recommended due to functional deficits.  Patient lives alone at [facility] independent living. Family refusing SNF and reports that they plan on hiring personal care attendants.    Past Medical History  Past Medical History:  Diagnosis Date  . Anxiety   . Closed right hip fracture, initial encounter ([facility]) 06/11/2018  . Hyperglycemia   . Hypertension   . Osteoporosis     Family History  family history includes Cancer in her brother; Heart failure in her father; High blood pressure in her mother.  Prior Rehab/Hospitalizations:  No previous rehab admissions.  Has the patient had major surgery during 100 days prior to admission? No  Current Medications   Current Facility-Administered Medications:  .  acetaminophen (TYLENOL) tablet 325-650 mg, 325-650 mg, Oral, Q6H PRN, Mcarthur Rossetti, MD, 650 mg at 06/13/18 2149 .  alum & mag hydroxide-simeth (MAALOX/MYLANTA) 200-200-20 MG/5ML suspension 30 mL, 30 mL, Oral, Q4H PRN, Mcarthur Rossetti, MD .  amLODipine (NORVASC) tablet 10 mg, 10 mg, Oral, Q supper, Mcarthur Rossetti, MD, 10 mg at 06/14/18 1722 .  aspirin EC tablet 325 mg, 325 mg, Oral, Q breakfast, Mcarthur Rossetti, MD, 325 mg at 06/15/18 0957 .  calcium carbonate (TUMS - dosed in mg elemental calcium) chewable tablet 400 mg of elemental calcium, 400 mg of elemental calcium, Oral, Daily, Gonfa, Taye T, MD, 400 mg of elemental calcium at 06/15/18 0959 .  carvedilol (COREG) tablet 3.125 mg, 3.125 mg, Oral, BID WC, Gonfa, Taye T, MD, 3.125 mg at 06/15/18 0957 .  cholecalciferol (VITAMIN D3) tablet 1,000 Units, 1,000 Units, Oral, Daily, Wendee Beavers T, MD, 1,000 Units at 06/15/18 0959 .  docusate sodium (COLACE) capsule 100 mg, 100 mg, Oral, BID, Mcarthur Rossetti, MD, 100 mg at 06/15/18 0959 .  docusate sodium (COLACE) capsule 100 mg, 100 mg, Oral, BID, Mcarthur Rossetti, MD,  100 mg at 06/15/18 0958 .  escitalopram (LEXAPRO) tablet 5 mg, 5 mg, Oral, QHS, Mcarthur Rossetti, MD, 5 mg at 06/14/18 2155 .  feeding supplement (ENSURE SURGERY) liquid 237 mL, 237 mL, Oral, Q24H, Gonfa, Taye T, MD, 237 mL at 06/14/18 1723 .  hydrALAZINE (APRESOLINE) tablet 10 mg, 10 mg, Oral, BID, Mcarthur Rossetti, MD, 10 mg at 06/15/18 0958 .  hydrochlorothiazide (HYDRODIURIL) tablet 25 mg, 25 mg, Oral, Daily, Mcarthur Rossetti, MD, 25 mg at 06/15/18 0958 .  HYDROcodone-acetaminophen (NORCO) 7.5-325 MG per tablet 1-2 tablet, 1-2 tablet, Oral, Q4H PRN, Mcarthur Rossetti, MD .  HYDROcodone-acetaminophen (NORCO/VICODIN) 5-325 MG per tablet 1-2 tablet, 1-2 tablet, Oral, Q6H PRN, Mcarthur Rossetti, MD .  HYDROcodone-acetaminophen (NORCO/VICODIN) 5-325 MG per tablet 1-2 tablet, 1-2 tablet, Oral, Q4H PRN, Mcarthur Rossetti, MD .  lactated ringers infusion, , Intravenous, Continuous, Mcarthur Rossetti, MD, Stopped at 06/12/18 1121 .  menthol-cetylpyridinium (CEPACOL) lozenge 3 mg, 1 lozenge, Oral, PRN **OR** phenol (CHLORASEPTIC) mouth spray 1 spray, 1 spray, Mouth/Throat, PRN, Mcarthur Rossetti, MD .  methocarbamol (ROBAXIN) tablet 500 mg, 500 mg, Oral, Q6H PRN **OR** methocarbamol (ROBAXIN) 500 mg in dextrose 5 % 50 mL IVPB, 500 mg, Intravenous, Q6H PRN, Mcarthur Rossetti, MD .  methocarbamol (ROBAXIN) tablet 500 mg, 500 mg, Oral, Q6H PRN **OR** methocarbamol (ROBAXIN) 500 mg in dextrose 5 % 50 mL IVPB, 500 mg, Intravenous, Q6H PRN, Mcarthur Rossetti, MD .  metoCLOPramide (REGLAN) tablet 5-10 mg, 5-10 mg, Oral, Q8H PRN **OR** metoCLOPramide (REGLAN) injection 5-10 mg, 5-10 mg, Intravenous, Q8H PRN, Mcarthur Rossetti, MD .  morphine 2 MG/ML injection 0.5 mg, 0.5 mg, Intravenous, Q2H PRN, Mcarthur Rossetti, MD .  morphine 2 MG/ML injection 0.5-1 mg, 0.5-1 mg, Intravenous, Q2H PRN, Mcarthur Rossetti, MD .  morphine 4 MG/ML  injection 4 mg, 4 mg, Intravenous, Q30 min PRN, Mcarthur Rossetti, MD, 4 mg at 06/11/18 1445 .  ondansetron (ZOFRAN) tablet 4 mg, 4 mg, Oral, Q6H PRN **OR** ondansetron (ZOFRAN) injection 4 mg, 4 mg, Intravenous, Q6H PRN, Mcarthur Rossetti, MD .  pantoprazole (PROTONIX) EC  tablet 40 mg, 40 mg, Oral, Daily, Mcarthur Rossetti, MD, 40 mg at 06/15/18 0958 .  polyethylene glycol (MIRALAX / GLYCOLAX) packet 17 g, 17 g, Oral, Daily PRN, Mcarthur Rossetti, MD, 17 g at 06/15/18 0959 .  traMADol (ULTRAM) tablet 50 mg, 50 mg, Oral, Q6H, Mcarthur Rossetti, MD, 50 mg at 06/15/18 1224  Patients Current Diet:  Diet Order            Diet - low sodium heart healthy        Diet regular Room service appropriate? Yes; Fluid consistency: Thin  Diet effective now              Precautions / Restrictions Precautions Precautions: Fall Restrictions Weight Bearing Restrictions: Yes RUE Weight Bearing: Weight bearing as tolerated RLE Weight Bearing: Weight bearing as tolerated   Has the patient had 2 or more falls or a fall with injury in the past year?No  Prior Activity Level Community (5-7x/wk): Went out 3-4 times a week.  Was driving, very active.  Home Assistive Devices / Equipment Home Assistive Devices/Equipment: None  Prior Device Use: Indicate devices/aids used by the patient prior to current illness, exacerbation or injury? None  Prior Functional Level Prior Function Level of Independence: Independent Comments: ADLs, IADLs, exercises, driving, and enjoys playing bridge.   Self Care: Did the patient need help bathing, dressing, using the toilet or eating?  Independent  Indoor Mobility: Did the patient need assistance with walking from room to room (with or without device)? Independent  Stairs: Did the patient need assistance with internal or external stairs (with or without device)? Independent  Functional Cognition: Did the patient need help planning regular  tasks such as shopping or remembering to take medications? Independent  Current Functional Level Cognition  Overall Cognitive Status: Within Functional Limits for tasks assessed Orientation Level: Oriented X4    Extremity Assessment (includes Sensation/Coordination)  Upper Extremity Assessment: RUE deficits/detail RUE Deficits / Details: Decreased ROM at shoulder. WFL elbow. Decreased grasp strength compared to left hand. 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 Requiring assistance to reach items due to limited ROM at right shoulder Upper Body Bathing: Minimal assistance, Sitting Upper Body Bathing Details (indicate cue type and reason): Due to limited ROM at RUE Lower Body Bathing: Minimal assistance, Sit to/from stand Upper Body Dressing : Minimal assistance, Sitting Upper Body Dressing Details (indicate cue type and reason): Due to limited ROM at RUE. Donning second gown like a jacket Lower Body Dressing: Minimal assistance, Sit to/from stand Lower Body Dressing Details (indicate cue type and reason): Min A for donning clothing over RLE. Also Min A for standing balance Toilet Transfer: Minimal assistance, Ambulation, RW(Simulated to recliner) Functional mobility during ADLs: Minimal assistance, Rolling walker General ADL Comments: pt presenting with decreased functional performance and is highly motivated to return to PLOF    Mobility  Overal bed mobility: Needs Assistance Bed Mobility: Supine to Sit Supine to sit: Min guard General bed mobility comments:  Pt in recliner upon arrival    Transfers  Overall transfer level: Needs assistance Equipment used: Rolling walker (2 wheeled) Transfers: Sit to/from Stand Sit  to Stand: Min assist General transfer comment: Min A for power up and gaining balance.    Ambulation / Gait / Stairs / Wheelchair Mobility  Ambulation/Gait Ambulation/Gait assistance: Herbalist (Feet): (40 ft X 2 trials with seated break) Assistive device: Rolling walker (2 wheeled) Gait Pattern/deviations: Decreased stride length, Step-through pattern General Gait Details: improved weight bearing on R LE and increased stride length this session; cues for posture and proximity to RW; seated break due to pt c/o nausea Gait velocity: decreased    Posture / Balance Balance Overall balance assessment: Needs assistance Sitting-balance support: Feet supported, Bilateral upper extremity supported Sitting balance-Leahy Scale: Fair Standing balance support: Bilateral upper extremity supported, During functional activity Standing balance-Leahy Scale: Poor Standing balance comment: reliant on RW for balance    Special needs/care consideration BiPAP/CPAP No CPM No Continuous Drip IV No Dialysis No     Life Vest No Oxygen No Special Bed No Trach Size No Wound Vac (area) No   Skin Dressing to right hip post op incision site                             Bowel mgmt: Last BM 06/10/18 Bladder mgmt: Voiding up in bathroom with assistance Diabetic mgmt:  Is pre-diabetic    Previous Home Environment Living Arrangements: Alone Available Help at Discharge: (can pay for personal care attendants) Type of Home: Apartment Home Layout: One level Home Access: Level entry Bathroom Shower/Tub: Multimedia programmer: Handicapped height [place]: No  Discharge Living Setting Plans for Discharge Living Setting: Apartment(ILF apartment at [facility] Type of Home at Discharge: Independent living  facility([facility]) [place] Name at Discharge: [facility] apartment Discharge Home Layout: One level, Able to live on main level with bedroom/bathroom Discharge Home Access: Level entry Discharge Bathroom Shower/Tub: Walk-in shower, Door Discharge Bathroom Toilet: Handicapped height Discharge Bathroom Accessibility: Yes How Accessible: Accessible via walker Does the patient have any problems obtaining your medications?: No  Social/Family/Support Systems Patient Roles: Parent, Other (Comment)(Has 2 daughters and a grandson.) Contact Information: Genella Mech - daughter - [phone] Anticipated Caregiver: Daughters and grandson Caregiver Availability: Intermittent Discharge Plan Discussed with Primary Caregiver: Yes Is Caregiver In Agreement with Plan?: Yes Does Caregiver/Family have Issues with Lodging/Transportation while Pt is in Rehab?: No  Goals/Additional Needs Patient/Family Goal for Rehab: PT OT mod I and supervision goals Expected length of stay: 7-10 days Cultural Considerations: [facility] Dietary Needs: Regular diet, thin liquids Equipment Needs: TBD Pt/Family Agrees to Admission and willing to participate: Yes Program Orientation Provided & Reviewed with Pt/Caregiver Including Roles  & Responsibilities: Yes  Decrease burden of Care through IP rehab admission: N/A  Possible need for SNF placement upon discharge: Not planned  Patient Condition: This patient's condition remains as documented in the consult dated 06/14/18, in which the Rehabilitation Physician determined and documented that the patient's condition is appropriate for intensive rehabilitative care in an inpatient rehabilitation facility. Will admit to inpatient rehab today.  Preadmission Screen Completed By:  Retta Diones, 06/15/2018 1:31 PM ______________________________________________________________________   Discussed status with Dr. Naaman Plummer on 06/15/18 at 1337 and received telephone  approval for admission today.  Admission Coordinator:  Retta Diones, time 1337/Date 06/15/18

## 2018-06-15 NOTE — H&P (Signed)
Physical Medicine and Rehabilitation Admission H&P     CC: Functional deficits.      HPI: Jessica Bruce is a 84 year old female with history of HTN, anxiety disorder, chronic dizziness; who fell down to steps while visiting family evening prior to admission on 06/11/2018 with injury to right shoulder as well as right hip and difficulty weightbearing the right lower extremity.  She was found to have right femoral neck fracture and a nondisplaced distal right clavicle fracture.  Right clavicle fracture to be treated conservatively per Ortho.  She was taken to the OR for right anterior hip arthroplasty by Dr. Ninfa Linden and postop to be weightbearing as tolerated with aspirin for DVT prophylaxis.  She has had issues with constipation, AKI, hyponatremia as well as acute blood loss anemia.  Therapy evaluations done revealing deficits in mobility and ADLs.  CIR  recommended for follow-up therapy     Review of Systems  Constitutional: Negative for chills and fever.  HENT: Negative for hearing loss and tinnitus.   Eyes: Negative for blurred vision and double vision.  Respiratory: Negative for cough and shortness of breath.   Cardiovascular: Positive for leg swelling. Negative for chest pain and palpitations.  Gastrointestinal: Positive for constipation, heartburn and nausea. Negative for abdominal pain.  Genitourinary: Negative for dysuria and urgency.  Musculoskeletal: Positive for joint pain and myalgias.  Skin: Negative for rash.  Neurological: Positive for dizziness (chronic--"all the time"). Negative for sensory change.  Psychiatric/Behavioral: The patient has insomnia. The patient is not nervous/anxious.           Past Medical History:  Diagnosis Date  . Anxiety    . Closed right hip fracture, initial encounter ([employer]) 06/11/2018  . Hyperglycemia    . Hypertension    . Osteoporosis             Past Surgical History:  Procedure Laterality Date  . ANTERIOR APPROACH HEMI HIP  ARTHROPLASTY Right 06/11/2018    Procedure: Right hip hemiarthroplasty direct anterior;  Surgeon: Mcarthur Rossetti, MD;  Location: [facility];  Service: Orthopedics;  Laterality: Right;  . CHOLECYSTECTOMY      . COLON SURGERY        pre-cancerous polyp very remotely           Family History  Problem Relation Age of Onset  . High blood pressure Mother    . Cancer Brother    . Heart failure Father        Social History:  reports that she has never smoked. She has never used smokeless tobacco. She reports that she drinks about 3.0 standard drinks of alcohol per week. She reports that she does not use drugs.          Allergies  Allergen Reactions  . Azithromycin Nausea And Vomiting  . Penicillins Nausea And Vomiting  . Penicillins Nausea And Vomiting      .Has patient had a PCN reaction causing immediate rash, facial/tongue/throat swelling, SOB or lightheadedness with hypotension: No Has patient had a PCN reaction causing severe rash involving mucus membranes or skin necrosis: No Has patient had a PCN reaction that required hospitalization No Has patient had a PCN reaction occurring within the last 10 years: No If all of the above answers are "NO", then may proceed with Cephalosporin use.            Medications Prior to Admission  Medication Sig Dispense Refill  . amLODipine (NORVASC) 10 MG tablet Take  10 mg by mouth daily with supper.      . Ascorbic Acid (VITAMIN C PO) Take 1 tablet by mouth every morning.      . carvedilol (COREG) 3.125 MG tablet Take 3.125 mg by mouth 2 (two) times daily with a meal.      . escitalopram (LEXAPRO) 5 MG tablet Take 5 mg by mouth at bedtime.      . hydrALAZINE (APRESOLINE) 10 MG tablet Take 10 mg by mouth 2 (two) times daily.      Marland Kitchen losartan-hydrochlorothiazide (HYZAAR) 100-25 MG per tablet Take 1 tablet by mouth daily.      Marland Kitchen VITAMIN D PO Take 1 tablet by mouth every morning.          Drug Regimen Review  Drug regimen was reviewed and  remains appropriate with no significant issues identified   Home: Home Living Family/patient expects to be discharged to:: Private residence(independent living at [facility]) Living Arrangements: Alone Available Help at Discharge: (can pay for personal care attendants) Type of Home: Apartment Home Access: Level entry Home Layout: One level Bathroom Shower/Tub: Multimedia programmer: Handicapped height   Functional History: Prior Function Level of Independence: Independent Comments: ADLs, IADLs, exercises, driving, and enjoys playing bridge.    Functional Status:  Mobility: Bed Mobility Overal bed mobility: Needs Assistance Bed Mobility: Supine to Sit Supine to sit: Min guard General bed mobility comments: Pt in recliner upon arrival Transfers Overall transfer level: Needs assistance Equipment used: Rolling walker (2 wheeled) Transfers: Sit to/from Stand Sit to Stand: Min assist General transfer comment: Min A for power up and gaining balance. Ambulation/Gait Ambulation/Gait assistance: Min assist Gait Distance (Feet): (40 ft X 2 trials with seated break) Assistive device: Rolling walker (2 wheeled) Gait Pattern/deviations: Decreased stride length, Step-through pattern General Gait Details: improved weight bearing on R LE and increased stride length this session; cues for posture and proximity to RW; seated break due to pt c/o nausea Gait velocity: decreased   ADL: ADL Overall ADL's : Needs assistance/impaired Eating/Feeding: Independent, Sitting Grooming: Wash/dry hands, Minimal assistance, Standing Grooming Details (indicate cue type and reason): Min A for safety and balance. Requiring assistance to reach items due to limited ROM at right shoulder Upper Body Bathing: Minimal assistance, Sitting Upper Body Bathing Details (indicate cue type and reason): Due to limited ROM at RUE Lower Body Bathing: Minimal assistance, Sit to/from stand Upper Body Dressing :  Minimal assistance, Sitting Upper Body Dressing Details (indicate cue type and reason): Due to limited ROM at RUE. Donning second gown like a jacket Lower Body Dressing: Minimal assistance, Sit to/from stand Lower Body Dressing Details (indicate cue type and reason): Min A for donning clothing over RLE. Also Min A for standing balance Toilet Transfer: Minimal assistance, Ambulation, RW(Simulated to recliner) Functional mobility during ADLs: Minimal assistance, Rolling walker General ADL Comments: pt presenting with decreased functional performance and is highly motivated to return to PLOF   Cognition: Cognition Overall Cognitive Status: Within Functional Limits for tasks assessed Orientation Level: Oriented X4 Cognition Arousal/Alertness: Awake/alert Behavior During Therapy: WFL for tasks assessed/performed Overall Cognitive Status: Within Functional Limits for tasks assessed     Blood pressure (!) 131/47, pulse 63, temperature (!) 97.5 F (36.4 C), temperature source Oral, resp. rate 16, height 5' (1.524 m), weight 61.2 kg, SpO2 95 %. Physical Exam  Nursing note and vitals reviewed. Constitutional: She is oriented to person, place, and time. She appears well-developed and well-nourished.  HENT:  Head: Normocephalic.  Eyes: Pupils are equal, round, and reactive to light. EOM are normal.  Neck: Normal range of motion. No tracheal deviation present. No thyromegaly present.  Cardiovascular: Normal rate and regular rhythm. Exam reveals no friction rub.  No murmur heard. Respiratory: Effort normal. No respiratory distress. She has no wheezes.  GI: Soft. She exhibits distension. Bowel sounds are increased. There is no tenderness.  Musculoskeletal: She exhibits edema.  Resolving ecchymosis right clavicle. Right hip with surgical foam dressing and min edema down to thigh. Mild drainage visible through dressing. Right hip tender with palpation   Neurological: She is alert and oriented to  person, place, and time. No cranial nerve deficit.  RUE limited proximally due to pain---> distally 3-4/5. LUE 5/5. LLE 4/5 prox to distal. RLE: 1-2/5 HF, KE and 3+/5 ADF/PF  Skin:  See above  Psychiatric: She has a normal mood and affect. Her behavior is normal. Judgment and thought content normal.      Lab Results Last 48 Hours        Results for orders placed or performed during the hospital encounter of 06/11/18 (from the past 48 hour(s))  CBC     Status: Abnormal    Collection Time: 06/14/18  2:42 AM  Result Value Ref Range    WBC 10.0 4.0 - 10.5 K/uL    RBC 3.43 (L) 3.87 - 5.11 MIL/uL    Hemoglobin 10.9 (L) 12.0 - 15.0 g/dL    HCT 33.1 (L) 36.0 - 46.0 %    MCV 96.5 80.0 - 100.0 fL    MCH 31.8 26.0 - 34.0 pg    MCHC 32.9 30.0 - 36.0 g/dL    RDW 13.0 11.5 - 15.5 %    Platelets 163 150 - 400 K/uL    nRBC 0.0 0.0 - 0.2 %      Comment: Performed at [facility], [place] [address], [place], [place] [address]  Basic metabolic panel     Status: Abnormal    Collection Time: 06/14/18  2:42 AM  Result Value Ref Range    Sodium 129 (L) 135 - 145 mmol/L    Potassium 5.0 3.5 - 5.1 mmol/L    Chloride 95 (L) 98 - 111 mmol/L    CO2 27 22 - 32 mmol/L    Glucose, Bld 116 (H) 70 - 99 mg/dL    BUN 26 (H) 8 - 23 mg/dL    Creatinine, Ser 0.91 0.44 - 1.00 mg/dL    Calcium 9.6 8.9 - 10.3 mg/dL    GFR calc non Af Amer 54 (L) >60 mL/min    GFR calc Af Amer >60 >60 mL/min    Anion gap 7 5 - 15      Comment: Performed at [facility] [address], [place], [place] [address]  Osmolality, urine     Status: None    Collection Time: 06/14/18  2:48 PM  Result Value Ref Range    Osmolality, Ur 724 300 - 900 mOsm/kg      Comment: Performed at [facility] [address], [place], [place] [address]  Sodium, urine, random     Status: None    Collection Time: 06/14/18  2:48 PM  Result Value Ref Range    Sodium, Ur 18 mmol/L      Comment: Performed at [facility]  [address], [place], [place] [address]  Creatinine, urine, random     Status: None    Collection Time: 06/14/18  2:48 PM  Result Value Ref Range    Creatinine, Urine 126.16 mg/dL      Comment: Performed at [facility], [place] [address], [place], [place] [address]  Cortisol-am, blood     Status: None    Collection Time: 06/15/18  4:19 AM  Result Value Ref Range    Cortisol - AM 15.1 6.7 - 22.6 ug/dL      Comment: Performed at [facility], [place] [address], [place], [place] [address]  Hemoglobin and hematocrit, blood     Status: Abnormal    Collection Time: 06/15/18  4:19 AM  Result Value Ref Range    Hemoglobin 11.0 (L) 12.0 - 15.0 g/dL    HCT 34.4 (L) 36.0 - 46.0 %      Comment: Performed at [facility], [place] [address] [place], [place] [address]  Basic metabolic panel     Status: Abnormal    Collection Time: 06/15/18  4:19 AM  Result Value Ref Range    Sodium 132 (L) 135 - 145 mmol/L    Potassium 4.6 3.5 - 5.1 mmol/L    Chloride 96 (L) 98 - 111 mmol/L    CO2 27 22 - 32 mmol/L    Glucose, Bld 106 (H) 70 - 99 mg/dL    BUN 20 8 - 23 mg/dL    Creatinine, Ser 0.87 0.44 - 1.00 mg/dL    Calcium 9.9 8.9 - 10.3 mg/dL    GFR calc non Af Amer 57 (L) >60 mL/min    GFR calc Af Amer >60 >60 mL/min    Anion gap 9 5 - 15      Comment: Performed at [facility] [address] [place], [place] [address]  Magnesium     Status: None    Collection Time: 06/15/18  4:19 AM  Result Value Ref Range    Magnesium 1.8 1.7 - 2.4 mg/dL      Comment: Performed at [facility] [address] [place], [place] [address]      Imaging Results (Last 48 hours)  No results found.           Medical Problem List and Plan: 1.  Functional and mobility deficits secondary to right femoral neck and right clavicle fractures. S/p right THA             -admit to inpatient rehab             -no hip precautions 2.  DVT Prophylaxis/Anticoagulation: Pharmaceutical: Lovenox added--change back to  ASA at discharge.  3. Pain Management: controlled with tramadol qid. Continue tylenol,ice  and robaxin prn.  4. Mood: Patient motivated to get better. LCSW to follow for evaluation and support.  5. Neuropsych: This patient is capable of making decisions on her own behalf. 6. Skin/Wound Care: Monitor wound for healing. Continue nutritional supplements             -maintain current dressing on right hip 7. Fluids/Electrolytes/Nutrition: Monitor I/O. Check lytes in am.  Add Ensure supplements as intake poor. 8. HTN: Monitor BP bid--on Coreg, hydralazine, amlodipine----hyzaar on hold at this time. Will d/c HCTZ. Now on lower dose coreg due to bradycardia. Continue to monitor HR bid 9. Anxiety disorder: Mood stable and managed with lexapro.   10. Insomnia: Resume melatonin.  11. ABLA: Monitor for now will recheck CBC in am.  12. Hyponateremia: Will discontinue HCTZ. Continue to monitor with serial checks including labs tomorrow 13.  Constipation: Reported nausea with poor p.o. intake--Will start MiraLAX daily.  Enema tonight if no results.             -discuss options for diet, encouraged better P.O. intake     Post Admission Physician Evaluation: 1. Functional deficits secondary  to right femoral neck and clavicle fractures. 2. Patient is admitted to receive collaborative, interdisciplinary care between the physiatrist, rehab nursing staff, and therapy team. 3. Patient's level of medical complexity and substantial therapy needs in context of that medical necessity cannot be provided at a lesser intensity of care such as a SNF. 4. Patient has experienced substantial functional loss from his/her baseline which was documented above under the "Functional History" and "Functional Status" headings.  Judging by the patient's diagnosis, physical exam, and functional history, the patient has potential for functional progress which will result in measurable gains while on inpatient rehab.  These gains will be  of substantial and practical use upon discharge  in facilitating mobility and self-care at the household level. 5. Physiatrist will provide 24 hour management of medical needs as well as oversight of the therapy plan/treatment and provide guidance as appropriate regarding the interaction of the two. 6. The Preadmission Screening has been reviewed and patient status is unchanged unless otherwise stated above. 7. 24 hour rehab nursing will assist with bladder management, bowel management, safety, skin/wound care, disease management, medication administration, pain management and patient education  and help integrate therapy concepts, techniques,education, etc. 8. PT will assess and treat for/with: Lower extremity strength, range of motion, stamina, balance, functional mobility, safety, adaptive techniques and equipment, pain mgt, hip precautions.   Goals are: mod I to supervision. 9. OT will assess and treat for/with: ADL's, functional mobility, safety, upper extremity strength, adaptive techniques and equipment, pain mgt, family ed, community reentry.   Goals are: supervision to mod I. Therapy may proceed with showering this patient. 10. SLP will assess and treat for/with: n/a.  Goals are: n/a. 11. Case Management and Social Worker will assess and treat for psychological issues and discharge planning. 12. Team conference will be held weekly to assess progress toward goals and to determine barriers to discharge. 13. Patient will receive at least 3 hours of therapy per day at least 5 days per week. 14. ELOS: 7-10 days       15. Prognosis:  excellent     I have personally performed a face to face diagnostic evaluation of this patient and formulated the key components of the plan.  Additionally, I have personally reviewed laboratory data, imaging studies, as well as relevant notes and concur with the physician assistant's documentation above.   Meredith Staggers, MD, Mellody Drown     Bary Leriche,  PA-C 06/15/2018   The patient's status has not changed. The original post admission physician evaluation remains appropriate, and any changes from the pre-admission screening or documentation from the acute chart are noted above.  Meredith Staggers, MD 06/15/2018

## 2018-06-15 NOTE — Discharge Summary (Signed)
Physician Discharge Summary  Jessica Bruce UEA:[id number] DOB: [birth year] DOA: 06/11/2018  PCP: Pa, [place] date: 06/11/2018 Discharge date: 06/15/2018  Admitted From: Home Disposition: CIR  Recommendations for Outpatient Follow-up:  1. Follow up with PCP in 1-2 weeks 2. Please obtain BMP/CBC/thyroid function in one week 3. Follow-up with orthopedic surgery in 2 weeks 4. Please follow up on the following pending results: None  Home Health: Not applicable Equipment/Devices: Not applicable  Discharge Condition: Stable CODE STATUS: Full code Diet recommendation: Heart healthy  HPI: Per Dr. Susa Day is a 84 y.o. female with medical history significant of HTN; HLD; and osteoporosis presenting after a mechanical fall.   She fell and broke her hip.  She missed a step and went crashing to the floor.  She fell last night.  She also hurt her right shoulder in the fall.     ED Course:  R hip fracture.  Dr. Ninfa Linden is in surgery, he will review films when out of the OR.  Recommends NPO and admission to [facility].  Also with R clavicle fracture - ?subacute.  Will place in sling  Hospital Course: 84 year old female with history of hypertension, hyperlipidemia, anxiety, osteoporosis presenting with mechanical fall and subsequent right hip fracture.  Status post right hip hemiarthroplasty on 11/29.  Evaluated by physical therapy who recommended CIR. evaluated by CIR and accepted.  Incidental finding of left clavicular closed fracture which appears to be about acute.  Orthopedic surgery recommended conservative management.  See individual problem list as below for more.  Discharge Diagnoses:  Principal Problem:   Closed right hip fracture, initial encounter [facility]) Active Problems:   Hypertension   Hyperglycemia   Right clavicle fracture   Drug induced constipation   Acute blood loss anemia   Benign essential HTN   Anxiety    Hyponatremia  Mechanical fall/right hip fracture: Status post right hip hemiarthroplasty on 11/29.  Doing well.  Pain well controlled.  Hemoglobin stable.  Discharged to CIR. Continue pain meds and bowel regimen.  Follow-up with orthopedic surgery in 2 weeks.  Repeat CBC and BMP in 1 week.  Aspirin 325 mg twice daily for VT prophylaxis.  Norco for pain control.  Colace and MiraLAX for bowel regimen.  PPI for GI prophylaxis while on high-dose aspirin.  History of osteoporosis: Vitamin D and calcium supplementation.  Outpatient follow-up for further evaluation.  Hyponatremia:  At baseline.  Cortisol level within normal limits.  Recommend thyroid function outpatient.  Subacute right clavicular fracture: Pain management.   Hypertension: fairly controlled on home Coreg, amlodipine HCTZ.  Holding losartan.  Leukocytosis: Likely stress-induced.  Resolved.  Mild creatinine elevation: Resolved.   Hypokalemia: Resolved.  Acute normocytic anemia:  Stable.  Combination of blood loss from recent surgery, blood draws and dilution.   Repeat CBC at follow-up.  Borderline thrombocytopenia: stable.  Repeat CBC at follow-up.   Discharge Instructions  Discharge Instructions    Diet - low sodium heart healthy   Complete by:  As directed    Full weight bearing   Complete by:  As directed    Increase activity slowly   Complete by:  As directed      Allergies as of 06/15/2018      Reactions   Azithromycin Nausea And Vomiting   Penicillins Nausea And Vomiting   Penicillins Nausea And Vomiting   .Has patient had a PCN reaction causing immediate rash, facial/tongue/throat swelling, SOB or lightheadedness with hypotension: No Has  patient had a PCN reaction causing severe rash involving mucus membranes or skin necrosis: No Has patient had a PCN reaction that required hospitalization No Has patient had a PCN reaction occurring within the last 10 years: No If all of the above answers are "NO",  then may proceed with Cephalosporin use.      Medication List    STOP taking these medications   losartan-hydrochlorothiazide 100-25 MG tablet Commonly known as:  HYZAAR     TAKE these medications   amLODipine 10 MG tablet Commonly known as:  NORVASC Take 10 mg by mouth daily with supper.   aspirin 325 MG EC tablet Take 1 tablet (325 mg total) by mouth daily with breakfast.   calcium carbonate 500 MG chewable tablet Commonly known as:  TUMS - dosed in mg elemental calcium Chew 2 tablets (400 mg of elemental calcium total) by mouth daily. Start taking on:  06/16/2018   carvedilol 3.125 MG tablet Commonly known as:  COREG Take 3.125 mg by mouth 2 (two) times daily with a meal.   docusate sodium 100 MG capsule Commonly known as:  COLACE Take 1 capsule (100 mg total) by mouth 2 (two) times daily.   escitalopram 5 MG tablet Commonly known as:  LEXAPRO Take 5 mg by mouth at bedtime.   feeding supplement Liqd Take 237 mLs by mouth daily.   hydrALAZINE 10 MG tablet Commonly known as:  APRESOLINE Take 10 mg by mouth 2 (two) times daily.   hydrochlorothiazide 25 MG tablet Commonly known as:  HYDRODIURIL Take 1 tablet (25 mg total) by mouth daily. Start taking on:  06/16/2018   HYDROcodone-acetaminophen 5-325 MG tablet Commonly known as:  NORCO/VICODIN Take 1 tablet by mouth every 6 (six) hours as needed for moderate pain.   pantoprazole 40 MG tablet Commonly known as:  PROTONIX Take 1 tablet (40 mg total) by mouth daily. Start taking on:  06/16/2018   polyethylene glycol packet Commonly known as:  MIRALAX / GLYCOLAX Take 17 g by mouth daily as needed for mild constipation.   VITAMIN C PO Take 1 tablet by mouth every morning.   VITAMIN D PO Take 1 tablet by mouth every morning.            Discharge Care Instructions  (From admission, onward)         Start     Ordered   06/14/18 0000  Full weight bearing     06/14/18 1144         Follow-up  Information    Mcarthur Rossetti, MD. Schedule an appointment as soon as possible for a visit in 2 week(s).   Specialty:  Orthopedic Surgery Contact information: [address] [place] [phone]           Consultations:  Orthopedic surgery  CIR  Procedures/Studies:  2D echo-none at this visit.  Dg Shoulder Right  Result Date: 06/11/2018 CLINICAL DATA:  84 year old female with a history of fall and shoulder pain EXAM: RIGHT SHOULDER - 2+ VIEW COMPARISON:  None. FINDINGS: Nondisplaced fracture of the distal clavicle. There is evidence of periosteal reaction and partial remodeling. Degenerative changes of the acromioclavicular joint. Glenohumeral joint appears congruent. IMPRESSION: Subacute fracture of the distal clavicle with early callus formation. Osteopenia Electronically Signed   By: Corrie Mckusick D.O.   On: 06/11/2018 13:04   Ct Head Wo Contrast  Result Date: 06/11/2018 CLINICAL DATA:  Fall down steps.  Right neck stiffness. EXAM: CT HEAD WITHOUT CONTRAST  CT CERVICAL SPINE WITHOUT CONTRAST TECHNIQUE: Multidetector CT imaging of the head and cervical spine was performed following the standard protocol without intravenous contrast. Multiplanar CT image reconstructions of the cervical spine were also generated. COMPARISON:  Cervical spine CT 09/26/2009.  CT head 11/28/2007 FINDINGS: CT HEAD FINDINGS Brain: There is atrophy and chronic small vessel disease changes. No acute intracranial abnormality. Specifically, no hemorrhage, hydrocephalus, mass lesion, acute infarction, or significant intracranial injury. Vascular: No hyperdense vessel or unexpected calcification. Skull: No acute calvarial abnormality. Sinuses/Orbits: Mucosal thickening throughout the paranasal sinuses. No air-fluid levels. Mastoid air cells are clear. Orbital soft tissues unremarkable. Other: None CT CERVICAL SPINE FINDINGS Alignment: No subluxation. Skull base and vertebrae: No  acute fracture. No primary bone lesion or focal pathologic process. Soft tissues and spinal canal: No prevertebral fluid or swelling. No visible canal hematoma. Disc levels: Advanced degenerative disc and facet disease throughout the cervical spine. Upper chest: No acute findings Other: None IMPRESSION: No acute intracranial abnormality. Atrophy, chronic microvascular disease. Degenerative disc and facet disease throughout the cervical spine. No acute bony abnormality. Electronically Signed   By: Rolm Baptise M.D.   On: 06/11/2018 13:49   Ct Cervical Spine Wo Contrast  Result Date: 06/11/2018 CLINICAL DATA:  Fall down steps.  Right neck stiffness. EXAM: CT HEAD WITHOUT CONTRAST CT CERVICAL SPINE WITHOUT CONTRAST TECHNIQUE: Multidetector CT imaging of the head and cervical spine was performed following the standard protocol without intravenous contrast. Multiplanar CT image reconstructions of the cervical spine were also generated. COMPARISON:  Cervical spine CT 09/26/2009.  CT head 11/28/2007 FINDINGS: CT HEAD FINDINGS Brain: There is atrophy and chronic small vessel disease changes. No acute intracranial abnormality. Specifically, no hemorrhage, hydrocephalus, mass lesion, acute infarction, or significant intracranial injury. Vascular: No hyperdense vessel or unexpected calcification. Skull: No acute calvarial abnormality. Sinuses/Orbits: Mucosal thickening throughout the paranasal sinuses. No air-fluid levels. Mastoid air cells are clear. Orbital soft tissues unremarkable. Other: None CT CERVICAL SPINE FINDINGS Alignment: No subluxation. Skull base and vertebrae: No acute fracture. No primary bone lesion or focal pathologic process. Soft tissues and spinal canal: No prevertebral fluid or swelling. No visible canal hematoma. Disc levels: Advanced degenerative disc and facet disease throughout the cervical spine. Upper chest: No acute findings Other: None IMPRESSION: No acute intracranial abnormality. Atrophy,  chronic microvascular disease. Degenerative disc and facet disease throughout the cervical spine. No acute bony abnormality. Electronically Signed   By: Rolm Baptise M.D.   On: 06/11/2018 13:49   Pelvis Portable  Result Date: 06/11/2018 CLINICAL DATA:  Postop right hip arthroplasty. EXAM: PORTABLE PELVIS 1-2 VIEWS COMPARISON:  Operative images acquired earlier today. FINDINGS: Single AP image shows a right hip hemiarthroplasty, which appears well seated and aligned. No acute fracture or evidence of an operative complication. IMPRESSION: Well-positioned right hip hemiarthroplasty. Electronically Signed   By: Lajean Manes M.D.   On: 06/11/2018 19:20   Dg C-arm 1-60 Min  Result Date: 06/11/2018 CLINICAL DATA:  Right hip hemiarthroplasty. EXAM: DG C-ARM 61-120 MIN; OPERATIVE RIGHT HIP WITH PELVIS COMPARISON:  Earlier today. FINDINGS: Five intraoperative fluoroscopic images demonstrate interval placement of a right hip arthroplasty with prosthesis intact and normally located. Remainder of the exam is unchanged. IMPRESSION: Expected changes post placement of right hip arthroplasty. Electronically Signed   By: Marin Olp M.D.   On: 06/11/2018 18:49   Dg Hip Operative Unilat W Or W/o Pelvis Right  Result Date: 06/11/2018 CLINICAL DATA:  Right hip hemiarthroplasty. EXAM: DG C-ARM 61-120 MIN;  OPERATIVE RIGHT HIP WITH PELVIS COMPARISON:  Earlier today. FINDINGS: Five intraoperative fluoroscopic images demonstrate interval placement of a right hip arthroplasty with prosthesis intact and normally located. Remainder of the exam is unchanged. IMPRESSION: Expected changes post placement of right hip arthroplasty. Electronically Signed   By: Marin Olp M.D.   On: 06/11/2018 18:49   Dg Hip Unilat W Or Wo Pelvis 2-3 Views Right  Result Date: 06/11/2018 CLINICAL DATA:  84 year old female with a history of fall and hip pain EXAM: DG HIP (WITH OR WITHOUT PELVIS) 2-3V RIGHT COMPARISON:  None. FINDINGS: Acute  subcapital fracture of the hips. Osteopenia. Bony pelvic ring appears intact with no pelvic fracture identified. Left hip projects normally over the acetabula. Vascular calcifications. IMPRESSION: Acute right subcapital hip fracture. Osteopenia Electronically Signed   By: Corrie Mckusick D.O.   On: 06/11/2018 13:05    Subjective: No events overnight.  Pain improved.  Denies chest pain, dyspnea, abdominal pain or dysuria.  Has not had bowel movement yet.  Given MiraLAX and stool softener this morning.  Daughter at bedside no more questions or concerns.  Discharge Exam: Vitals:   06/14/18 2042 06/15/18 0500  BP: (!) 129/48 (!) 131/47  Pulse: 62 63  Resp: 16   Temp: (!) 96.1 F (35.6 C) (!) 97.5 F (36.4 C)  SpO2: 93% 95%    GENERAL: Appears well. No acute distress.  HEENT: MMM.  Vision and Hearing grossly intact.  NECK: Supple.  LUNGS:  No IWOB. Good air movement. CTAB.  HEART:  RRR. Heart sounds normal. ABD: Bowel sounds present. Soft. Non tender.  MSK/EXT:   no edema bilaterally.  Mild tenderness over lateral aspect of right clavicle.  No steps of. SKIN: Mild jaundice of her right shoulder from prior ecchymosis.  Surgical wound appears clean with no surrounding swelling or erythema. NEURO: Awake, alert and oriented appropriately.  No gross deficit.  PSYCH: Calm. Normal affect.    The results of significant diagnostics from this hospitalization (including imaging, microbiology, ancillary and laboratory) are listed below for reference.     Microbiology: No results found for this or any previous visit (from the past 240 hour(s)).   Labs: BNP (last 3 results) No results for input(s): BNP in the last 8760 hours. Basic Metabolic Panel: Recent Labs  Lab 06/11/18 1239 06/12/18 0430 06/13/18 0647 06/14/18 0242 06/15/18 0419  NA 134* 133* 132* 129* 132*  K 3.4* 3.9 3.0* 5.0 4.6  CL 98 97* 95* 95* 96*  CO2 23 24 28 27 27   GLUCOSE 115* 128* 110* 116* 106*  BUN 18 19 23  26* 20   CREATININE 0.81 1.05* 0.95 0.91 0.87  CALCIUM 10.2 9.6 9.5 9.6 9.9  MG  --   --  1.9  --  1.8   Liver Function Tests: No results for input(s): AST, ALT, ALKPHOS, BILITOT, PROT, ALBUMIN in the last 168 hours. No results for input(s): LIPASE, AMYLASE in the last 168 hours. No results for input(s): AMMONIA in the last 168 hours. CBC: Recent Labs  Lab 06/11/18 1239 06/12/18 0430 06/13/18 0647 06/14/18 0242 06/15/18 0419  WBC 15.1* 12.4* 10.7* 10.0  --   HGB 15.4* 12.8 11.8* 10.9* 11.0*  HCT 46.2* 38.1 35.6* 33.1* 34.4*  MCV 95.3 95.0 96.0 96.5  --   PLT 181 165 145* 163  --    Cardiac Enzymes: No results for input(s): CKTOTAL, CKMB, CKMBINDEX, TROPONINI in the last 168 hours. BNP: Invalid input(s): POCBNP CBG: No results for input(s): GLUCAP in  the last 168 hours. D-Dimer No results for input(s): DDIMER in the last 72 hours. Hgb A1c No results for input(s): HGBA1C in the last 72 hours. Lipid Profile No results for input(s): CHOL, HDL, LDLCALC, TRIG, CHOLHDL, LDLDIRECT in the last 72 hours. Thyroid function studies No results for input(s): TSH, T4TOTAL, T3FREE, THYROIDAB in the last 72 hours.  Invalid input(s): FREET3 Anemia work up No results for input(s): VITAMINB12, FOLATE, FERRITIN, TIBC, IRON, RETICCTPCT in the last 72 hours. Urinalysis    Component Value Date/Time   COLORURINE YELLOW 11/01/2016 1815   APPEARANCEUR CLEAR 11/01/2016 1815   LABSPEC 1.020 11/01/2016 1815   PHURINE 6.0 11/01/2016 1815   GLUCOSEU NEGATIVE 11/01/2016 1815   HGBUR NEGATIVE 11/01/2016 1815   BILIRUBINUR NEGATIVE 11/01/2016 1815   KETONESUR NEGATIVE 11/01/2016 1815   PROTEINUR NEGATIVE 11/01/2016 1815   UROBILINOGEN 0.2 11/28/2007 1817   NITRITE NEGATIVE 11/01/2016 1815   LEUKOCYTESUR NEGATIVE 11/01/2016 1815   Sepsis Labs Invalid input(s): PROCALCITONIN,  WBC,  LACTICIDVEN   Time coordinating discharge: 25 minutes  SIGNED:  Mercy Riding, MD  [facility] 06/15/2018,  11:37 AM Pager [phone]  If 7PM-7AM, please contact night-coverage [email or web address] Password [email or web address]

## 2018-06-15 NOTE — H&P (Addendum)
Physical Medicine and Rehabilitation Admission H&P    CC: Functional deficits.    HPI: Jessica Bruce is a 84 year old female with history of HTN, anxiety disorder, chronic dizziness; who fell down to steps while visiting family evening prior to admission on 06/11/2018 with injury to right shoulder as well as right hip and difficulty weightbearing the right lower extremity.  She was found to have right femoral neck fracture and a nondisplaced distal right clavicle fracture.  Right clavicle fracture to be treated conservatively per Ortho.  She was taken to the OR for right anterior hip arthroplasty by Dr. Ninfa Linden and postop to be weightbearing as tolerated with aspirin for DVT prophylaxis.  She has had issues with constipation, AKI, hyponatremia as well as acute blood loss anemia.  Therapy evaluations done revealing deficits in mobility and ADLs.  CIR  recommended for follow-up therapy   Review of Systems  Constitutional: Negative for chills and fever.  HENT: Negative for hearing loss and tinnitus.   Eyes: Negative for blurred vision and double vision.  Respiratory: Negative for cough and shortness of breath.   Cardiovascular: Positive for leg swelling. Negative for chest pain and palpitations.  Gastrointestinal: Positive for constipation, heartburn and nausea. Negative for abdominal pain.  Genitourinary: Negative for dysuria and urgency.  Musculoskeletal: Positive for joint pain and myalgias.  Skin: Negative for rash.  Neurological: Positive for dizziness (chronic--"all the time"). Negative for sensory change.  Psychiatric/Behavioral: The patient has insomnia. The patient is not nervous/anxious.     Past Medical History:  Diagnosis Date  . Anxiety   . Closed right hip fracture, initial encounter ([place]) 06/11/2018  . Hyperglycemia   . Hypertension   . Osteoporosis     Past Surgical History:  Procedure Laterality Date  . ANTERIOR APPROACH HEMI HIP ARTHROPLASTY Right 06/11/2018     Procedure: Right hip hemiarthroplasty direct anterior;  Surgeon: Mcarthur Rossetti, MD;  Location: [facility];  Service: Orthopedics;  Laterality: Right;  . CHOLECYSTECTOMY    . COLON SURGERY     pre-cancerous polyp very remotely    Family History  Problem Relation Age of Onset  . High blood pressure Mother   . Cancer Brother   . Heart failure Father     Social History:  reports that she has never smoked. She has never used smokeless tobacco. She reports that she drinks about 3.0 standard drinks of alcohol per week. She reports that she does not use drugs.   Allergies  Allergen Reactions  . Azithromycin Nausea And Vomiting  . Penicillins Nausea And Vomiting  . Penicillins Nausea And Vomiting    .Has patient had a PCN reaction causing immediate rash, facial/tongue/throat swelling, SOB or lightheadedness with hypotension: No Has patient had a PCN reaction causing severe rash involving mucus membranes or skin necrosis: No Has patient had a PCN reaction that required hospitalization No Has patient had a PCN reaction occurring within the last 10 years: No If all of the above answers are "NO", then may proceed with Cephalosporin use.    Medications Prior to Admission  Medication Sig Dispense Refill  . amLODipine (NORVASC) 10 MG tablet Take 10 mg by mouth daily with supper.    . Ascorbic Acid (VITAMIN C PO) Take 1 tablet by mouth every morning.    . carvedilol (COREG) 3.125 MG tablet Take 3.125 mg by mouth 2 (two) times daily with a meal.    . escitalopram (LEXAPRO) 5 MG tablet Take 5 mg by mouth  at bedtime.    . hydrALAZINE (APRESOLINE) 10 MG tablet Take 10 mg by mouth 2 (two) times daily.    Marland Kitchen losartan-hydrochlorothiazide (HYZAAR) 100-25 MG per tablet Take 1 tablet by mouth daily.    Marland Kitchen VITAMIN D PO Take 1 tablet by mouth every morning.      Drug Regimen Review  Drug regimen was reviewed and remains appropriate with no significant issues identified  Home: Home  Living Family/patient expects to be discharged to:: Private residence(independent living at [facility]) Living Arrangements: Alone Available Help at Discharge: (can pay for personal care attendants) Type of Home: Apartment Home Access: Level entry Home Layout: One level Bathroom Shower/Tub: Multimedia programmer: Handicapped height   Functional History: Prior Function Level of Independence: Independent Comments: ADLs, IADLs, exercises, driving, and enjoys playing bridge.   Functional Status:  Mobility: Bed Mobility Overal bed mobility: Needs Assistance Bed Mobility: Supine to Sit Supine to sit: Min guard General bed mobility comments: Pt in recliner upon arrival Transfers Overall transfer level: Needs assistance Equipment used: Rolling walker (2 wheeled) Transfers: Sit to/from Stand Sit to Stand: Min assist General transfer comment: Min A for power up and gaining balance. Ambulation/Gait Ambulation/Gait assistance: Min assist Gait Distance (Feet): (40 ft X 2 trials with seated break) Assistive device: Rolling walker (2 wheeled) Gait Pattern/deviations: Decreased stride length, Step-through pattern General Gait Details: improved weight bearing on R LE and increased stride length this session; cues for posture and proximity to RW; seated break due to pt c/o nausea Gait velocity: decreased    ADL: ADL Overall ADL's : Needs assistance/impaired Eating/Feeding: Independent, Sitting Grooming: Wash/dry hands, Minimal assistance, Standing Grooming Details (indicate cue type and reason): Min A for safety and balance. Requiring assistance to reach items due to limited ROM at right shoulder Upper Body Bathing: Minimal assistance, Sitting Upper Body Bathing Details (indicate cue type and reason): Due to limited ROM at RUE Lower Body Bathing: Minimal assistance, Sit to/from stand Upper Body Dressing : Minimal assistance, Sitting Upper Body Dressing Details (indicate cue type  and reason): Due to limited ROM at RUE. Donning second gown like a jacket Lower Body Dressing: Minimal assistance, Sit to/from stand Lower Body Dressing Details (indicate cue type and reason): Min A for donning clothing over RLE. Also Min A for standing balance Toilet Transfer: Minimal assistance, Ambulation, RW(Simulated to recliner) Functional mobility during ADLs: Minimal assistance, Rolling walker General ADL Comments: pt presenting with decreased functional performance and is highly motivated to return to PLOF  Cognition: Cognition Overall Cognitive Status: Within Functional Limits for tasks assessed Orientation Level: Oriented X4 Cognition Arousal/Alertness: Awake/alert Behavior During Therapy: WFL for tasks assessed/performed Overall Cognitive Status: Within Functional Limits for tasks assessed   Blood pressure (!) 131/47, pulse 63, temperature (!) 97.5 F (36.4 C), temperature source Oral, resp. rate 16, height 5' (1.524 m), weight 61.2 kg, SpO2 95 %. Physical Exam  Nursing note and vitals reviewed. Constitutional: She is oriented to person, place, and time. She appears well-developed and well-nourished.  HENT:  Head: Normocephalic.  Eyes: Pupils are equal, round, and reactive to light. EOM are normal.  Neck: Normal range of motion. No tracheal deviation present. No thyromegaly present.  Cardiovascular: Normal rate and regular rhythm. Exam reveals no friction rub.  No murmur heard. Respiratory: Effort normal. No respiratory distress. She has no wheezes.  GI: Soft. She exhibits distension. Bowel sounds are increased. There is no tenderness.  Musculoskeletal: She exhibits edema.  Resolving ecchymosis right clavicle. Right hip with  surgical foam dressing and min edema down to thigh. Mild drainage visible through dressing. Right hip tender with palpation   Neurological: She is alert and oriented to person, place, and time. No cranial nerve deficit.  RUE limited proximally due to  pain---> distally 3-4/5. LUE 5/5. LLE 4/5 prox to distal. RLE: 1-2/5 HF, KE and 3+/5 ADF/PF  Skin:  See above  Psychiatric: She has a normal mood and affect. Her behavior is normal. Judgment and thought content normal.    Results for orders placed or performed during the hospital encounter of 06/11/18 (from the past 48 hour(s))  CBC     Status: Abnormal   Collection Time: 06/14/18  2:42 AM  Result Value Ref Range   WBC 10.0 4.0 - 10.5 K/uL   RBC 3.43 (L) 3.87 - 5.11 MIL/uL   Hemoglobin 10.9 (L) 12.0 - 15.0 g/dL   HCT 33.1 (L) 36.0 - 46.0 %   MCV 96.5 80.0 - 100.0 fL   MCH 31.8 26.0 - 34.0 pg   MCHC 32.9 30.0 - 36.0 g/dL   RDW 13.0 11.5 - 15.5 %   Platelets 163 150 - 400 K/uL   nRBC 0.0 0.0 - 0.2 %    Comment: Performed at [facility], [place] [address] [place], [place] [address]  Basic metabolic panel     Status: Abnormal   Collection Time: 06/14/18  2:42 AM  Result Value Ref Range   Sodium 129 (L) 135 - 145 mmol/L   Potassium 5.0 3.5 - 5.1 mmol/L   Chloride 95 (L) 98 - 111 mmol/L   CO2 27 22 - 32 mmol/L   Glucose, Bld 116 (H) 70 - 99 mg/dL   BUN 26 (H) 8 - 23 mg/dL   Creatinine, Ser 0.91 0.44 - 1.00 mg/dL   Calcium 9.6 8.9 - 10.3 mg/dL   GFR calc non Af Amer 54 (L) >60 mL/min   GFR calc Af Amer >60 >60 mL/min   Anion gap 7 5 - 15    Comment: Performed at [facility] [address] [place], [place] [address]  Osmolality, urine     Status: None   Collection Time: 06/14/18  2:48 PM  Result Value Ref Range   Osmolality, Ur 724 300 - 900 mOsm/kg    Comment: Performed at [facility] [address] [place], [place] [address]  Sodium, urine, random     Status: None   Collection Time: 06/14/18  2:48 PM  Result Value Ref Range   Sodium, Ur 18 mmol/L    Comment: Performed at [facility] [address] [place], [place] [address]  Creatinine, urine, random     Status: None   Collection Time: 06/14/18  2:48 PM  Result Value Ref Range   Creatinine,  Urine 126.16 mg/dL    Comment: Performed at [facility] [address] [place], [place] [address]  Cortisol-am, blood     Status: None   Collection Time: 06/15/18  4:19 AM  Result Value Ref Range   Cortisol - AM 15.1 6.7 - 22.6 ug/dL    Comment: Performed at [facility], [place] [address] [place], [place] [address]  Hemoglobin and hematocrit, blood     Status: Abnormal   Collection Time: 06/15/18  4:19 AM  Result Value Ref Range   Hemoglobin 11.0 (L) 12.0 - 15.0 g/dL   HCT 34.4 (L) 36.0 - 46.0 %    Comment: Performed at  [facility], [place] [address], [place], [place] [address]  Basic metabolic panel     Status: Abnormal   Collection Time: 06/15/18  4:19 AM  Result Value Ref Range   Sodium 132 (L) 135 - 145 mmol/L   Potassium 4.6 3.5 - 5.1 mmol/L   Chloride 96 (L) 98 - 111 mmol/L   CO2 27 22 - 32 mmol/L   Glucose, Bld 106 (H) 70 - 99 mg/dL   BUN 20 8 - 23 mg/dL   Creatinine, Ser 0.87 0.44 - 1.00 mg/dL   Calcium 9.9 8.9 - 10.3 mg/dL   GFR calc non Af Amer 57 (L) >60 mL/min   GFR calc Af Amer >60 >60 mL/min   Anion gap 9 5 - 15    Comment: Performed at [facility] [address] [place], [place] [address]  Magnesium     Status: None   Collection Time: 06/15/18  4:19 AM  Result Value Ref Range   Magnesium 1.8 1.7 - 2.4 mg/dL    Comment: Performed at [facility] [address], [place], [place] [address]   No results found.     Medical Problem List and Plan: 1.  Functional and mobility deficits secondary to right femoral neck and right clavicle fractures. S/p right THA  -admit to inpatient rehab  -no hip precautions 2.  DVT Prophylaxis/Anticoagulation: Pharmaceutical: Lovenox added--change back to ASA at discharge.  3. Pain Management: controlled with tramadol qid. Continue tylenol,ice  and robaxin prn.  4. Mood: Patient motivated to get better. LCSW to follow for evaluation and support.  5. Neuropsych: This patient is capable of making  decisions on her own behalf. 6. Skin/Wound Care: Monitor wound for healing. Continue nutritional supplements  -maintain current dressing on right hip 7. Fluids/Electrolytes/Nutrition: Monitor I/O. Check lytes in am.  Add Ensure supplements as intake poor. 8. HTN: Monitor BP bid--on Coreg, hydralazine, amlodipine----hyzaar on hold at this time. Will d/c HCTZ. Now on lower dose coreg due to bradycardia. Continue to monitor HR bid 9. Anxiety disorder: Mood stable and managed with lexapro.   10. Insomnia: Resume melatonin.  11. ABLA: Monitor for now will recheck CBC in am.  12. Hyponateremia: Will discontinue HCTZ. Continue to monitor with serial checks including labs tomorrow 13.  Constipation: Reported nausea with poor p.o. intake--Will start MiraLAX daily.  Enema tonight if no results.  -discuss options for diet, encouraged better P.O. intake   Post Admission Physician Evaluation: 1. Functional deficits secondary  to right femoral neck and clavicle fractures. 2. Patient is admitted to receive collaborative, interdisciplinary care between the physiatrist, rehab nursing staff, and therapy team. 3. Patient's level of medical complexity and substantial therapy needs in context of that medical necessity cannot be provided at a lesser intensity of care such as a SNF. 4. Patient has experienced substantial functional loss from his/her baseline which was documented above under the "Functional History" and "Functional Status" headings.  Judging by the patient's diagnosis, physical exam, and functional history, the patient has potential for functional progress which will result in measurable gains while on inpatient rehab.  These gains will be of substantial and practical use upon discharge  in facilitating mobility and self-care at the household level. 5. Physiatrist will provide 24 hour management of medical needs as well as oversight of the therapy plan/treatment and provide guidance as appropriate  regarding the interaction of the two. 6. The Preadmission Screening has been reviewed and patient status is unchanged unless  otherwise stated above. 7. 24 hour rehab nursing will assist with bladder management, bowel management, safety, skin/wound care, disease management, medication administration, pain management and patient education  and help integrate therapy concepts, techniques,education, etc. 8. PT will assess and treat for/with: Lower extremity strength, range of motion, stamina, balance, functional mobility, safety, adaptive techniques and equipment, pain mgt, hip precautions.   Goals are: mod I to supervision. 9. OT will assess and treat for/with: ADL's, functional mobility, safety, upper extremity strength, adaptive techniques and equipment, pain mgt, family ed, community reentry.   Goals are: supervision to mod I. Therapy may proceed with showering this patient. 10. SLP will assess and treat for/with: n/a.  Goals are: n/a. 11. Case Management and Social Worker will assess and treat for psychological issues and discharge planning. 12. Team conference will be held weekly to assess progress toward goals and to determine barriers to discharge. 13. Patient will receive at least 3 hours of therapy per day at least 5 days per week. 14. ELOS: 7-10 days       15. Prognosis:  excellent   I have personally performed a face to face diagnostic evaluation of this patient and formulated the key components of the plan.  Additionally, I have personally reviewed laboratory data, imaging studies, as well as relevant notes and concur with the physician assistant's documentation above.  Meredith Staggers, MD, [facility] Drown    Bary Leriche, PA-C 06/15/2018

## 2018-06-15 NOTE — IPOC Note (Addendum)
Overall Plan of Care [facility]) Patient Details Name: Jessica Bruce MRN: [id number] DOB: 05-Sep-1922  Admitting Diagnosis: Multi-Ortho.   Hospital Problems: Active Problems:   Hip fracture requiring operative repair [facility])   Closed fracture of neck of right femur (HCC)   Hypoalbuminemia due to protein-calorie malnutrition ([place])     Functional Problem List: Nursing Bladder, Bowel, Edema, Endurance, Medication Management, Pain, Nutrition, Safety, Skin Integrity  PT Behavior, Balance, Edema, Endurance, Motor, Pain, Safety  OT Balance, Cognition, Endurance, Pain, Safety  SLP    TR   Activity tolerance, functional mobility, balance, safety, pain       Basic ADL's: OT Grooming, Bathing, Dressing, Toileting     Advanced  ADL's: OT       Transfers: PT Bed Mobility, Car, Bed to Chair, [employer], Floor  OT Toilet, Tub/Shower     Locomotion: PT Ambulation, Emergency planning/management officer, Stairs     Additional Impairments: OT    SLP        TR  community skills    Anticipated Outcomes Item Anticipated Outcome  Self Feeding no goal  Swallowing      Basic self-care  MOD I  Toileting  MOD I toilet; S bathing   Bathroom Transfers MOD I toilet; S bathing  Bowel/Bladder  continent of bowel and bladder with min assist  Transfers  Mod I  Locomotion  Mod I  Communication     Cognition     Pain  pain equal to or less than 4/10 with min assist  Safety/Judgment  free from falls/injury and diaplaying sound safety judgement   Therapy Plan: PT Intensity: Minimum of 1-2 x/day ,45 to 90 minutes PT Frequency: 5 out of 7 days PT Duration Estimated Length of Stay: 10-12 days OT Intensity: Minimum of 1-2 x/day, 45 to 90 minutes OT Frequency: 5 out of 7 days OT Duration/Estimated Length of Stay: 7-10  TR Duration/ELOS:  Min 1 session >60 minutes for community reintegration during LOS TR Frequency:  Min 1 time per week >20 minutes        Team Interventions: Nursing Interventions  Patient/Family Education, Bladder Management, Bowel Management, Pain Management, Medication Management, Skin Care/Wound Management, Discharge Planning  PT interventions Ambulation/gait training, Disease management/prevention, Pain management, Stair training, Therapeutic Activities, DME/adaptive equipment instruction, Training and development officer, Patient/family education, Therapeutic Exercise, Cognitive remediation/compensation, Functional mobility training, Skin care/wound management, UE/LE Strength taining/ROM, Discharge planning, UE/LE Coordination activities, Neuromuscular re-education, Splinting/orthotics  OT Interventions Balance/vestibular training, Discharge planning, Pain management, Self Care/advanced ADL retraining, Therapeutic Activities, UE/LE Coordination activities, Cognitive remediation/compensation, Disease mangement/prevention, Functional mobility training, Patient/family education, Skin care/wound managment, Therapeutic Exercise, Visual/perceptual remediation/compensation, Community reintegration, Engineer, drilling, Neuromuscular re-education, Psychosocial support, UE/LE Strength taining/ROM, Wheelchair propulsion/positioning  SLP Interventions    TR Interventions   Recreation/leisure participation, functional mobility community reintegration, pt/family education, adaptive equipment instruction/use, discharge planning, psychosocial support  SW/CM Interventions Discharge Planning, Psychosocial Support, Patient/Family Education   Barriers to Discharge MD  Medical stability, Wound care and Lack of/limited family support  Nursing      PT      OT Decreased caregiver support, Lack of/limited family support    SLP      SW       Team Discharge Planning: Destination: PT-Home ,OT- Home(ILF apartment) , SLP-  Projected Follow-up: PT-Home health PT, OT-  Home health OT, SLP-  Projected Equipment Needs: PT-To be determined, OT- Tub/shower seat, SLP-  Equipment Details:  PT- , OT-  Patient/family involved in discharge planning: PT- Patient,  OT-Patient,  SLP-   MD ELOS: 7-10 days. Medical Rehab Prognosis:  Good Assessment: 84 year old female with history ofHTN, anxiety disorder,chronic dizziness; who fell down to steps while visiting family evening prior to admission on 06/11/2018 with injury to right shoulder as well as right hip and difficulty weightbearing the right lower extremity. She was found to have right femoral neck fracture and a nondisplaced distal right clavicle fracture. Right clavicle fracture to be treated conservatively per Ortho. She was taken to the OR for right anterior hip arthroplasty by Dr. Ninfa Linden and postop to be weightbearing as tolerated with aspirin for DVT prophylaxis. She has had issues with constipation, AKI, hyponatremia as well as acute blood loss anemia. Therapy evaluations done revealing deficits in mobility and ADLs. Will set goals for Mod I with PT, Mod I/Supervision with OT.  See Team Conference Notes for weekly updates to the plan of care

## 2018-06-15 NOTE — Progress Notes (Signed)
Jamse Arn, MD  Physician  Physical Medicine and Rehabilitation  Consult Note  Signed  Date of Service:  06/14/2018 8:40 AM       Related encounter: ED to Hosp-Admission (Current) from 06/11/2018 in [place] All Collapse All    Show:Clear all [x] Manual[x] Template[] Copied  Added by: [x] Love, Ivan Anchors, PA-C[x] Jamse Arn, MD  [] Hover for details      Physical Medicine and Rehabilitation Consult   Reason for Consult: Hip fracture with functional deficits Referring Physician: Dr. Cyndia Skeeters   HPI: Jessica Bruce is a 84 y.o. female with history of HTN, anxiety disorder; who fell down 2 steps the night prior to admission on 06/11/2018 with injury to right shoulder as well as right hip and difficulty weightbearing.  History taken from chart review and family.  She was found to have right femoral neck fracture and a nondisplaced distal right clavicle fracture.  Right clavicle fracture treated conservatively with sling and she was taken to the OR for anterior right hip hemiarthroplasty by Dr. Ninfa Linden.  Postop to be WBAT and on aspirin for DVT prophylaxis.  Postop has had issues with constipation, acute kidney injury as well as acute blood loss anemia.  Therapy evaluations done and CIR recommended due to functional deficits.  Patient lives alone at [facility] independent living. Family refusing SNF and reports that they plan on hiring personal care attendants.   Review of Systems  Constitutional: Negative for chills and fever.  HENT: Negative for hearing loss and tinnitus.   Eyes: Negative for blurred vision and double vision.  Respiratory: Negative for cough and shortness of breath.   Cardiovascular: Negative for chest pain and palpitations.  Gastrointestinal: Positive for diarrhea (chronic). Negative for abdominal pain, heartburn and nausea.  Genitourinary: Negative for dysuria and urgency.         Gets up 2-3 times at nights  Musculoskeletal: Positive for joint pain and myalgias.  Skin: Negative for itching and rash.  Neurological: Positive for dizziness (occasionally). Negative for sensory change and focal weakness.  Psychiatric/Behavioral: The patient is nervous/anxious and has insomnia.   All other systems reviewed and are negative.      Past Medical History:  Diagnosis Date  . Anxiety   . Closed right hip fracture, initial encounter ([place]) 06/11/2018  . Hyperglycemia   . Hypertension   . Osteoporosis          Past Surgical History:  Procedure Laterality Date  . CHOLECYSTECTOMY    . COLON SURGERY     pre-cancerous polyp very remotely         Family History  Problem Relation Age of Onset  . High blood pressure Mother   . Cancer Brother   . Heart failure Father      Social History:  Lives at [facility] independent living and was independent without AD.  Facility does home management and meals prep. She reports that she has never smoked. She has never used smokeless tobacco. She reports that she drinks about 3.0 standard drinks of alcohol per week. She reports that she does not use drugs.         Allergies  Allergen Reactions  . Azithromycin Nausea And Vomiting  . Penicillins Nausea And Vomiting  . Penicillins Nausea And Vomiting    .Has patient had a PCN reaction causing immediate rash, facial/tongue/throat swelling, SOB or lightheadedness with hypotension: No Has patient had a  PCN reaction causing severe rash involving mucus membranes or skin necrosis: No Has patient had a PCN reaction that required hospitalization No Has patient had a PCN reaction occurring within the last 10 years: No If all of the above answers are "NO", then may proceed with Cephalosporin use.          Medications Prior to Admission  Medication Sig Dispense Refill  . amLODipine (NORVASC) 10 MG tablet Take 10 mg by mouth daily with supper.    . Ascorbic  Acid (VITAMIN C PO) Take 1 tablet by mouth every morning.    . carvedilol (COREG) 3.125 MG tablet Take 3.125 mg by mouth 2 (two) times daily with a meal.    . escitalopram (LEXAPRO) 5 MG tablet Take 5 mg by mouth at bedtime.    . hydrALAZINE (APRESOLINE) 10 MG tablet Take 10 mg by mouth 2 (two) times daily.    Marland Kitchen losartan-hydrochlorothiazide (HYZAAR) 100-25 MG per tablet Take 1 tablet by mouth daily.    Marland Kitchen VITAMIN D PO Take 1 tablet by mouth every morning.      Home: Home Living Family/patient expects to be discharged to:: Private residence(independent living at [facility]) Living Arrangements: Alone Available Help at Discharge: (can pay for personal care attendants) Type of Home: Apartment Home Access: Level entry Home Layout: One level  Functional History: Prior Function Level of Independence: Independent Functional Status:  Mobility: Bed Mobility Overal bed mobility: Needs Assistance Bed Mobility: Supine to Sit Supine to sit: Min guard General bed mobility comments: min guard for safety; increased time and effort; use of rail  Transfers Overall transfer level: Needs assistance Equipment used: Rolling walker (2 wheeled) Transfers: Sit to/from Stand Sit to Stand: Mod assist, Min assist General transfer comment: assist to power up into standing; increased assist needed from EOB  Ambulation/Gait Ambulation/Gait assistance: Min assist Gait Distance (Feet): (40 ft X 2 trials with seated break) Assistive device: Rolling walker (2 wheeled) Gait Pattern/deviations: Decreased stride length, Step-through pattern General Gait Details: improved weight bearing on R LE and increased stride length this session; cues for posture and proximity to RW; seated break due to pt c/o nausea Gait velocity: decreased  ADL:  Cognition: Cognition Overall Cognitive Status: Within Functional Limits for tasks assessed Orientation Level: Oriented X4 Cognition Arousal/Alertness:  Awake/alert Behavior During Therapy: WFL for tasks assessed/performed Overall Cognitive Status: Within Functional Limits for tasks assessed   Blood pressure (!) 132/43, pulse 70, temperature 98.4 F (36.9 C), temperature source Oral, resp. rate 16, height 5' (1.524 m), weight 61.2 kg, SpO2 93 %. Physical Exam  Nursing note and vitals reviewed. Constitutional: She appears well-developed and well-nourished.  HENT:  Head: Normocephalic and atraumatic.  Eyes: EOM are normal. Right eye exhibits no discharge. Left eye exhibits no discharge.  Neck: Normal range of motion. Neck supple.  Cardiovascular: Normal rate and regular rhythm.  Respiratory: Effort normal and breath sounds normal.  GI: Soft. She exhibits distension. Bowel sounds are decreased. There is no tenderness.  Musculoskeletal:  Right anterior hip with foam dressing and moderate edema. Resolving ecchymosis right shoulder--able to activate with minimal discomfort.   Neurological: She is alert.  Motor: Right upper extremity 4/5 proximal distal Left upper extremity: 4+/5 proximal distal Right lower; hip flexion, knee extension 3+/5, ankle dorsiflexion 4/5 Left lower extremity: Hip, knee extension 4/5, ankle dorsiflexion 4+/5 Sensation subjectively diminished light touch left  Skin: Skin is warm and dry.  Multiple keratosis BLE. See above  Psychiatric: She has a normal mood  and affect. Her behavior is normal. Thought content normal.          Assessment/Plan: Diagnosis: Right hip fracture s/p right hemiarthroplasty Labs independently reviewed.  Records reviewed and summated above.  1. Does the need for close, 24 hr/day medical supervision in concert with the patient's rehab needs make it unreasonable for this patient to be served in a less intensive setting? Yes  2. Co-Morbidities requiring supervision/potential complications: constipation (increase bowel meds), acute kidney injury (avoid nephrotoxic meds), acute blood loss  anemia (repeat labs, transfuse to ensure appropriate perfusion for increased activity tolerance), HTN (monitor and provide prns in accordance with increased physical exertion and pain), anxiety (ensure anxiety and resulting apprehension do not limit functional progress; consider prn medications if warranted), hyponatremia (cont to monitor, treat if necessary) 3. Due to bowel management, skin/wound care, disease management, pain management and patient education, does the patient require 24 hr/day rehab nursing? Yes 4. Does the patient require coordinated care of a physician, rehab nurse, PT (1-2 hrs/day, 5 days/week) and OT (1-2 hrs/day, 5 days/week) to address physical and functional deficits in the context of the above medical diagnosis(es)? Yes Addressing deficits in the following areas: balance, endurance, locomotion, strength, transferring, bowel/bladder control, bathing, dressing, toileting and psychosocial support 5. Can the patient actively participate in an intensive therapy program of at least 3 hrs of therapy per day at least 5 days per week? Yes 6. The potential for patient to make measurable gains while on inpatient rehab is excellent 7. Anticipated functional outcomes upon discharge from inpatient rehab are modified independent and supervision  with PT, modified independent and supervision with OT, n/a with SLP. 8. Estimated rehab length of stay to reach the above functional goals is: 7-10 days. 9. Anticipated D/C setting: Home 10. Anticipated post D/C treatments: ILF 11. Overall Rehab/Functional Prognosis: excellent and good  RECOMMENDATIONS: This patient's condition is appropriate for continued rehabilitative care in the following setting: CIR if plan is to hire caregiver support to discharge to ILF after bowel movement. Patient has agreed to participate in recommended program. Yes Note that insurance prior authorization may be required for reimbursement for recommended  care.  Comment: Rehab Admissions Coordinator to follow up.   I have personally performed a face to face diagnostic evaluation, including, but not limited to relevant history and physical exam findings, of this patient and developed relevant assessment and plan.  Additionally, I have reviewed and concur with the physician assistant's documentation above.   Delice Lesch, MD, ABPMR Bary Leriche, PA-C 06/14/2018        Revision History                   Routing History

## 2018-06-15 NOTE — Progress Notes (Signed)
Retta Diones, RN  Rehab Admission Coordinator  Physical Medicine and Rehabilitation  PMR Pre-admission  Signed  Date of Service:  06/15/2018 1:28 PM       Related encounter: ED to Hosp-Admission (Current) from 06/11/2018 in [place]      Signed         Show:Clear all [x] Manual[x] Template[x] Copied  Added by: [x] Retta Diones, RN  [] Hover for details PMR Admission Coordinator Pre-Admission Assessment  Patient: Jessica Bruce is an 84 y.o., female MRN: [id number] DOB: 1922-11-09 Height: 5' (152.4 cm) Weight: 61.2 kg                                                                                                                                                  Insurance Information HMO: No   PPO:       PCP:       IPA:       80/20:       OTHER:   PRIMARY:  Medicare A and B      Policy#: [id number]      Subscriber: patient CM Name:        Phone#:       Fax#:   Pre-Cert#:        Employer:   Benefits:  Phone #:       Name: Checked in [place]. Date: 11/11/89     Deduct: $1364      Out of Pocket Max: None      Life Max: N/A CIR: 100%      SNF: 100 days Outpatient: 80%     Co-Pay: 20% Home Health: 100%      Co-Pay: none DME: 80%     Co-Pay: 20% Providers: patient's choice  SECONDARY:  [employer] [place]      Policy#:        Subscriber:   CM Name:        Phone#:       Fax#:   Pre-Cert#:        Employer:   Benefits:  Phone #:       Name:   Eff. Date:       Deduct:        Out of Pocket Max:        Life Max:   CIR:        SNF:   Outpatient:       Co-Pay:   Home Health:        Co-Pay:   DME:       Co-Pay:    Emergency Contact Information         Contact Information    Name Relation Home Work Mobile   Tebbit,[name] Daughter [phone]       Current Medical History  Patient Admitting Diagnosis: Right hip fracture s/p right  hemiarthroplasty  History of Present Illness: A 84 y.o.femalewith history of  HTN, anxiety disorder;who fell down 2 steps the night prior to admission on11/29/2019 with injury to right shoulder as well as right hip and difficulty weightbearing.History taken from chart review and family.She was found to have right femoral neck fracture and a nondisplaced distal right clavicle fracture. Right clavicle fracture treated conservatively with sling and she was taken to the OR for anterior right hip hemiarthroplasty by Dr. Ninfa Linden. Postop to be WBAT and on aspirin for DVT prophylaxis. Postop has had issues with constipation,acute kidney injury as well as acute blood loss anemia. Therapy evaluations done and CIR recommended due to functional deficits.Patient lives alone at [facility] independent living. [facility] andreports that they plan on hiring personal care attendants.   Past Medical History      Past Medical History:  Diagnosis Date  . Anxiety   . Closed right hip fracture, initial encounter ([place]) 06/11/2018  . Hyperglycemia   . Hypertension   . Osteoporosis     Family History  family history includes Cancer in her brother; Heart failure in her father; High blood pressure in her mother.  Prior Rehab/Hospitalizations:  No previous rehab admissions.  Has the patient had major surgery during 100 days prior to admission? No  Current Medications   Current Facility-Administered Medications:  .  acetaminophen (TYLENOL) tablet 325-650 mg, 325-650 mg, Oral, Q6H PRN, Mcarthur Rossetti, MD, 650 mg at 06/13/18 2149 .  alum & mag hydroxide-simeth (MAALOX/MYLANTA) 200-200-20 MG/5ML suspension 30 mL, 30 mL, Oral, Q4H PRN, Mcarthur Rossetti, MD .  amLODipine (NORVASC) tablet 10 mg, 10 mg, Oral, Q supper, Mcarthur Rossetti, MD, 10 mg at 06/14/18 1722 .  aspirin EC tablet 325 mg, 325 mg, Oral, Q breakfast, Mcarthur Rossetti, MD, 325 mg at 06/15/18 0957 .  calcium carbonate (TUMS - dosed in mg elemental calcium) chewable  tablet 400 mg of elemental calcium, 400 mg of elemental calcium, Oral, Daily, Gonfa, Taye T, MD, 400 mg of elemental calcium at 06/15/18 0959 .  carvedilol (COREG) tablet 3.125 mg, 3.125 mg, Oral, BID WC, Gonfa, Taye T, MD, 3.125 mg at 06/15/18 0957 .  cholecalciferol (VITAMIN D3) tablet 1,000 Units, 1,000 Units, Oral, Daily, Wendee Beavers T, MD, 1,000 Units at 06/15/18 0959 .  docusate sodium (COLACE) capsule 100 mg, 100 mg, Oral, BID, Mcarthur Rossetti, MD, 100 mg at 06/15/18 0959 .  docusate sodium (COLACE) capsule 100 mg, 100 mg, Oral, BID, Mcarthur Rossetti, MD, 100 mg at 06/15/18 0958 .  escitalopram (LEXAPRO) tablet 5 mg, 5 mg, Oral, QHS, Mcarthur Rossetti, MD, 5 mg at 06/14/18 2155 .  feeding supplement (ENSURE SURGERY) liquid 237 mL, 237 mL, Oral, Q24H, Gonfa, Taye T, MD, 237 mL at 06/14/18 1723 .  hydrALAZINE (APRESOLINE) tablet 10 mg, 10 mg, Oral, BID, Mcarthur Rossetti, MD, 10 mg at 06/15/18 0958 .  hydrochlorothiazide (HYDRODIURIL) tablet 25 mg, 25 mg, Oral, Daily, Mcarthur Rossetti, MD, 25 mg at 06/15/18 0958 .  HYDROcodone-acetaminophen (NORCO) 7.5-325 MG per tablet 1-2 tablet, 1-2 tablet, Oral, Q4H PRN, Mcarthur Rossetti, MD .  HYDROcodone-acetaminophen (NORCO/VICODIN) 5-325 MG per tablet 1-2 tablet, 1-2 tablet, Oral, Q6H PRN, Mcarthur Rossetti, MD .  HYDROcodone-acetaminophen (NORCO/VICODIN) 5-325 MG per tablet 1-2 tablet, 1-2 tablet, Oral, Q4H PRN, Mcarthur Rossetti, MD .  lactated ringers infusion, , Intravenous, Continuous, Mcarthur Rossetti, MD, Stopped at 06/12/18 1121 .  menthol-cetylpyridinium (CEPACOL) lozenge 3 mg, 1 lozenge,  Oral, PRN **OR** phenol (CHLORASEPTIC) mouth spray 1 spray, 1 spray, Mouth/Throat, PRN, Mcarthur Rossetti, MD .  methocarbamol (ROBAXIN) tablet 500 mg, 500 mg, Oral, Q6H PRN **OR** methocarbamol (ROBAXIN) 500 mg in dextrose 5 % 50 mL IVPB, 500 mg, Intravenous, Q6H PRN, Mcarthur Rossetti,  MD .  methocarbamol (ROBAXIN) tablet 500 mg, 500 mg, Oral, Q6H PRN **OR** methocarbamol (ROBAXIN) 500 mg in dextrose 5 % 50 mL IVPB, 500 mg, Intravenous, Q6H PRN, Mcarthur Rossetti, MD .  metoCLOPramide (REGLAN) tablet 5-10 mg, 5-10 mg, Oral, Q8H PRN **OR** metoCLOPramide (REGLAN) injection 5-10 mg, 5-10 mg, Intravenous, Q8H PRN, Mcarthur Rossetti, MD .  morphine 2 MG/ML injection 0.5 mg, 0.5 mg, Intravenous, Q2H PRN, Mcarthur Rossetti, MD .  morphine 2 MG/ML injection 0.5-1 mg, 0.5-1 mg, Intravenous, Q2H PRN, Mcarthur Rossetti, MD .  morphine 4 MG/ML injection 4 mg, 4 mg, Intravenous, Q30 min PRN, Mcarthur Rossetti, MD, 4 mg at 06/11/18 1445 .  ondansetron (ZOFRAN) tablet 4 mg, 4 mg, Oral, Q6H PRN **OR** ondansetron (ZOFRAN) injection 4 mg, 4 mg, Intravenous, Q6H PRN, Mcarthur Rossetti, MD .  pantoprazole (PROTONIX) EC tablet 40 mg, 40 mg, Oral, Daily, Mcarthur Rossetti, MD, 40 mg at 06/15/18 0958 .  polyethylene glycol (MIRALAX / GLYCOLAX) packet 17 g, 17 g, Oral, Daily PRN, Mcarthur Rossetti, MD, 17 g at 06/15/18 0959 .  traMADol (ULTRAM) tablet 50 mg, 50 mg, Oral, Q6H, Mcarthur Rossetti, MD, 50 mg at 06/15/18 1224  Patients Current Diet:     Diet Order                  Diet - low sodium heart healthy         Diet regular Room service appropriate? Yes; Fluid consistency: Thin  Diet effective now               Precautions / Restrictions Precautions Precautions: Fall Restrictions Weight Bearing Restrictions: Yes RUE Weight Bearing: Weight bearing as tolerated RLE Weight Bearing: Weight bearing as tolerated   Has the patient had 2 or more falls or a fall with injury in the past year?No  Prior Activity Level Community (5-7x/wk): Went out 3-4 times a week.  Was driving, very active.  Home Assistive Devices / Equipment Home Assistive Devices/Equipment: None  Prior Device Use: Indicate devices/aids used by the  patient prior to current illness, exacerbation or injury? None  Prior Functional Level Prior Function Level of Independence: Independent Comments: ADLs, IADLs, exercises, driving, and enjoys playing bridge.   Self Care: Did the patient need help bathing, dressing, using the toilet or eating?  Independent  Indoor Mobility: Did the patient need assistance with walking from room to room (with or without device)? Independent  Stairs: Did the patient need assistance with internal or external stairs (with or without device)? Independent  Functional Cognition: Did the patient need help planning regular tasks such as shopping or remembering to take medications? Independent  Current Functional Level Cognition  Overall Cognitive Status: Within Functional Limits for tasks assessed Orientation Level: Oriented X4    Extremity Assessment (includes Sensation/Coordination)  Upper Extremity Assessment: RUE deficits/detail RUE Deficits / Details: Decreased ROM at shoulder. WFL elbow. Decreased grasp strength compared to left hand. 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 Requiring assistance to reach items due to limited ROM at right shoulder Upper Body Bathing: Minimal assistance, Sitting Upper Body Bathing Details (indicate cue type and reason): Due to limited ROM at RUE Lower Body Bathing: Minimal assistance, Sit to/from stand Upper Body Dressing : Minimal assistance, Sitting Upper Body Dressing Details (indicate cue type and reason): Due to limited ROM at RUE.  Donning second gown like a jacket Lower Body Dressing: Minimal assistance, Sit to/from stand Lower Body Dressing Details (indicate cue type and reason): Min A for donning clothing over RLE. Also Min A for standing balance Toilet Transfer: Minimal assistance, Ambulation, RW(Simulated to recliner) Functional mobility during ADLs: Minimal assistance, Rolling walker General ADL Comments: pt presenting with decreased functional performance and is highly motivated to return to PLOF    Mobility  Overal bed mobility: Needs Assistance Bed Mobility: Supine to Sit Supine to sit: Min guard General bed mobility comments: Pt in recliner upon arrival    Transfers  Overall transfer level: Needs assistance Equipment used: Rolling walker (2 wheeled) Transfers: Sit to/from Stand Sit to Stand: Min assist General transfer comment: Min A for power up and gaining balance.    Ambulation / Gait / Stairs / Wheelchair Mobility  Ambulation/Gait Ambulation/Gait assistance: Herbalist (Feet): (40 ft X 2 trials with seated break) Assistive device: Rolling walker (2 wheeled) Gait Pattern/deviations: Decreased stride length, Step-through pattern General Gait Details: improved weight bearing on R LE and increased stride length this session; cues for posture and proximity to RW; seated break due to pt c/o nausea Gait velocity: decreased    Posture / Balance Balance Overall balance assessment: Needs assistance Sitting-balance support: Feet supported, Bilateral upper extremity supported Sitting balance-Leahy Scale: Fair Standing balance support: Bilateral upper extremity supported, During functional activity Standing balance-Leahy Scale: Poor Standing balance comment: reliant on RW for balance    Special needs/care consideration BiPAP/CPAP No CPM No Continuous Drip IV No Dialysis No     Life Vest No Oxygen No Special Bed No Trach Size No Wound Vac (area) No   Skin Dressing to right hip  post op incision site                             Bowel mgmt: Last BM 06/10/18 Bladder mgmt: Voiding up in bathroom with assistance Diabetic mgmt:  Is pre-diabetic    Previous Home Environment Living Arrangements: Alone Available Help at Discharge: (can pay for personal care attendants) Type of Home: Apartment Home Layout: One level Home Access: Level entry Bathroom Shower/Tub: Multimedia programmer: Handicapped height [place]: No  Discharge Living Setting Plans for Discharge Living Setting: Apartment(ILF apartment at [facility].) Type of Home at Discharge: Independent living facility([facility]) [place] Name at Discharge: [place] apartment Discharge Home Layout: One level, Able to live on main level with bedroom/bathroom Discharge Home Access: Level entry Discharge Bathroom Shower/Tub: Walk-in shower, Door Discharge Bathroom Toilet: Handicapped height Discharge Bathroom Accessibility: Yes How Accessible: Accessible via walker Does the patient have any problems obtaining your medications?: No  Social/Family/Support Systems Patient Roles: Parent, Other (Comment)(Has 2 daughters and a grandson.) Contact Information: Genella Mech - daughter - [phone] Anticipated Caregiver: Daughters and grandson Caregiver Availability: Intermittent Discharge Plan Discussed with Primary Caregiver: Yes Is Caregiver In Agreement with Plan?: Yes  Does Caregiver/Family have Issues with Lodging/Transportation while Pt is in Rehab?: No  Goals/Additional Needs Patient/Family Goal for Rehab: PT OT mod I and supervision goals Expected length of stay: 7-10 days Cultural Considerations: [facility] Dietary Needs: Regular diet, thin liquids Equipment Needs: TBD Pt/Family Agrees to Admission and willing to participate: Yes Program Orientation Provided & Reviewed with Pt/Caregiver Including Roles  & Responsibilities: Yes  Decrease burden of Care through IP  rehab admission: N/A  Possible need for SNF placement upon discharge: Not planned  Patient Condition: This patient's condition remains as documented in the consult dated 06/14/18, in which the Rehabilitation Physician determined and documented that the patient's condition is appropriate for intensive rehabilitative care in an inpatient rehabilitation facility. Will admit to inpatient rehab today.  Preadmission Screen Completed By:  Retta Diones, 06/15/2018 1:31 PM ______________________________________________________________________   Discussed status with Dr. Naaman Plummer on 06/15/18 at 1337 and received telephone approval for admission today.  Admission Coordinator:  Retta Diones, time 1337/Date 06/15/18           Cosigned by: Meredith Staggers, MD at 06/15/2018 3:00 PM  Revision History

## 2018-06-15 NOTE — Progress Notes (Signed)
IP rehab admissions - I met with patient and her daughter.  Daughter would like inpatient rehab admission here at [facility].  Patient has been medically cleared by attending MD.  Bed available and will admit to inpatient rehab today.  Call me for questions.  [phone]

## 2018-06-15 NOTE — Plan of Care (Signed)
?  Problem: Elimination: ?Goal: Will not experience complications related to urinary retention ?Outcome: Progressing ?  ?

## 2018-06-16 ENCOUNTER — Inpatient Hospital Stay (HOSPITAL_COMMUNITY): Payer: Medicare Other | Admitting: Occupational Therapy

## 2018-06-16 ENCOUNTER — Inpatient Hospital Stay (HOSPITAL_COMMUNITY): Payer: Medicare Other

## 2018-06-16 DIAGNOSIS — I10 Essential (primary) hypertension: Secondary | ICD-10-CM

## 2018-06-16 DIAGNOSIS — S42034S Nondisplaced fracture of lateral end of right clavicle, sequela: Secondary | ICD-10-CM

## 2018-06-16 DIAGNOSIS — K5903 Drug induced constipation: Secondary | ICD-10-CM

## 2018-06-16 DIAGNOSIS — E8809 Other disorders of plasma-protein metabolism, not elsewhere classified: Secondary | ICD-10-CM

## 2018-06-16 DIAGNOSIS — E46 Unspecified protein-calorie malnutrition: Secondary | ICD-10-CM

## 2018-06-16 LAB — COMPREHENSIVE METABOLIC PANEL
ALT: 13 U/L (ref 0–44)
AST: 22 U/L (ref 15–41)
Albumin: 2.7 g/dL — ABNORMAL LOW (ref 3.5–5.0)
Alkaline Phosphatase: 53 U/L (ref 38–126)
Anion gap: 8 (ref 5–15)
BUN: 21 mg/dL (ref 8–23)
CHLORIDE: 94 mmol/L — AB (ref 98–111)
CO2: 31 mmol/L (ref 22–32)
Calcium: 9.9 mg/dL (ref 8.9–10.3)
Creatinine, Ser: 0.71 mg/dL (ref 0.44–1.00)
GFR calc Af Amer: >60 mL/min (ref >60)
GFR calc non Af Amer: >60 mL/min (ref >60)
Glucose, Bld: 122 mg/dL — ABNORMAL HIGH (ref 70–99)
Potassium: 4.3 mmol/L (ref 3.5–5.1)
Sodium: 133 mmol/L — ABNORMAL LOW (ref 135–145)
Total Bilirubin: 1.1 mg/dL (ref 0.3–1.2)
Total Protein: 5.6 g/dL — ABNORMAL LOW (ref 6.5–8.1)

## 2018-06-16 LAB — CBC WITH DIFFERENTIAL/PLATELET
Abs Immature Granulocytes: 0.06 10*3/uL (ref 0.00–0.07)
Basophils Absolute: 0 10*3/uL (ref 0.0–0.1)
Basophils Relative: 0 %
Eosinophils Absolute: 0.2 10*3/uL (ref 0.0–0.5)
Eosinophils Relative: 2 %
HCT: 34.3 % — ABNORMAL LOW (ref 36.0–46.0)
Hemoglobin: 10.9 g/dL — ABNORMAL LOW (ref 12.0–15.0)
Immature Granulocytes: 1 %
Lymphocytes Relative: 18 %
Lymphs Abs: 1.7 10*3/uL (ref 0.7–4.0)
MCH: 30.6 pg (ref 26.0–34.0)
MCHC: 31.8 g/dL (ref 30.0–36.0)
MCV: 96.3 fL (ref 80.0–100.0)
MONO ABS: 1.1 10*3/uL — AB (ref 0.1–1.0)
Monocytes Relative: 12 %
Neutro Abs: 6.2 10*3/uL (ref 1.7–7.7)
Neutrophils Relative %: 67 %
PLATELETS: 189 10*3/uL (ref 150–400)
RBC: 3.56 MIL/uL — ABNORMAL LOW (ref 3.87–5.11)
RDW: 12.7 % (ref 11.5–15.5)
WBC: 9.2 10*3/uL (ref 4.0–10.5)
nRBC: 0 % (ref 0.0–0.2)

## 2018-06-16 MED ORDER — ACETAMINOPHEN 325 MG PO TABS: 325.0000 mg | ORAL_TABLET | ORAL | Status: DC | PRN

## 2018-06-16 MED ORDER — CLONAZEPAM 0.25 MG PO TBDP: 0.2500 mg | ORAL_TABLET | Freq: Every evening | ORAL | Status: DC | PRN

## 2018-06-16 MED ORDER — CLONAZEPAM 0.5 MG PO TABS: 0.2500 mg | ORAL_TABLET | Freq: Every evening | ORAL | Status: DC | PRN

## 2018-06-16 NOTE — Consult Note (Signed)
            [facility] CM Primary Care Navigator  06/16/2018  JEZABELLA SCHRIEVER 01-11-1923 [id number]   Attemptto see patient at the bedside to identify possible discharge needs but shehad been transferred to [place] (CIR [id number] [id number]).  Per MD note, patient was admittedafter presenting with a mechanical fall and broke her hip as well as right clavicle fracture. (closed right hip fracture status post right hip hemiarthroplasty, subacute right clavicular fracture- pain management, acute normocytic anemia)   Patient has discharge instruction to follow-up with primary care provider in 1- 2 weeks and orthopedic surgery follow-up in 2 weeks.   Primary care provider's office is listed as providing transition of care (TOC) follow-up.   For additional questions please contact:  Edwena Felty A. Ziza Hastings, BSN, RN-BC [facility] PRIMARY CARE Navigator Cell: [phone]

## 2018-06-16 NOTE — Plan of Care (Signed)
  Problem: RH BOWEL ELIMINATION Goal: RH STG MANAGE BOWEL WITH ASSISTANCE Description STG Manage Bowel with min Assistance.  Outcome: Not Progressing Goal: RH STG MANAGE BOWEL W/MEDICATION W/ASSISTANCE Description STG Manage Bowel with Medication with min Assistance.  Outcome: Not Progressing   

## 2018-06-16 NOTE — Evaluation (Signed)
Occupational Therapy Assessment and Plan  Patient Details  Name: Jessica Bruce MRN: [id number] Date of Birth: 06-01-1923  OT Diagnosis: abnormal posture, acute pain, muscle weakness (generalized) and pain in joint Rehab Potential:   ELOS: 7-10   Today's Date: 06/16/2018 OT Individual Time: 0700-0800 OT Individual Time Calculation (min): 60 min     Problem List:  Patient Active Problem List   Diagnosis Date Noted  . Hip fracture requiring operative repair ([place]) 06/15/2018  . Closed fracture of neck of right femur ([place])   . Drug induced constipation   . Acute blood loss anemia   . Benign essential HTN   . Anxiety   . Hyponatremia   . Closed right hip fracture, initial encounter ([place]) 06/11/2018  . Right clavicle fracture 06/11/2018  . Hypertension   . Hyperglycemia     Past Medical History:  Past Medical History:  Diagnosis Date  . Anxiety   . Closed right hip fracture, initial encounter ([place]) 06/11/2018  . Hyperglycemia   . Hypertension   . Osteoporosis    Past Surgical History:  Past Surgical History:  Procedure Laterality Date  . ANTERIOR APPROACH HEMI HIP ARTHROPLASTY Right 06/11/2018   Procedure: Right hip hemiarthroplasty direct anterior;  Surgeon: Mcarthur Rossetti, MD;  Location: [place];  Service: Orthopedics;  Laterality: Right;  . CHOLECYSTECTOMY    . COLON SURGERY     pre-cancerous polyp very remotely    Assessment & Plan Clinical Impression:  84 year old female with history of HTN, anxiety disorder, chronic dizziness; who fell down to steps while visiting family evening prior to admission on 06/11/2018 with injury to right shoulder as well as right hip and difficulty weightbearing the right lower extremity.  She was found to have right femoral neck fracture and a nondisplaced distal right clavicle fracture.  Right clavicle fracture to be treated conservatively per Ortho.  She was taken to the OR for right anterior hip arthroplasty by Dr. Ninfa Linden and  postop to be weightbearing as tolerated with aspirin for DVT prophylaxis.  She has had issues with constipation, AKI, hyponatremia as well as acute blood loss anemia.  Therapy evaluations done revealing deficits in mobility and ADLs.   .    Patient currently requires mod with basic self-care skills secondary to muscle weakness, decreased cardiorespiratoy endurance, decreased problem solving and decreased sitting balance, decreased standing balance and decreased balance strategies.  Prior to hospitalization, patient could complete BADL/IADL with modified independent .  Patient will benefit from skilled intervention to increase independence with basic self-care skills prior to discharge home with care partner.  Anticipate patient will require intermittent supervision and follow up home health.  OT - End of Session Activity Tolerance: Tolerates 30+ min activity with multiple rests Endurance Deficit: Yes OT Assessment Rehab Potential (ACUTE ONLY): Good OT Barriers to Discharge: Decreased caregiver support;Lack of/limited family support OT Patient demonstrates impairments in the following area(s): Balance;Cognition;Endurance;Pain;Safety OT Basic ADL's Functional Problem(s): Grooming;Bathing;Dressing;Toileting OT Transfers Functional Problem(s): Toilet;Tub/Shower OT Plan OT Intensity: Minimum of 1-2 x/day, 45 to 90 minutes OT Frequency: 5 out of 7 days OT Duration/Estimated Length of Stay: 7-10 OT Treatment/Interventions: Balance/vestibular training;Discharge planning;Pain management;Self Care/advanced ADL retraining;Therapeutic Activities;UE/LE Coordination activities;Cognitive remediation/compensation;Disease mangement/prevention;Functional mobility training;Patient/family education;Skin care/wound managment;Therapeutic Exercise;Visual/perceptual remediation/compensation;Community reintegration;DME/adaptive equipment instruction;Neuromuscular re-education;Psychosocial support;UE/LE Strength  taining/ROM;Wheelchair propulsion/positioning OT Self Feeding Anticipated Outcome(s): no goal OT Basic Self-Care Anticipated Outcome(s): MOD I OT Toileting Anticipated Outcome(s): MOD I toilet; S bathing OT Bathroom Transfers Anticipated Outcome(s): MOD I toilet; S bathing  OT Recommendation Patient destination: Home(ILF apartment) Follow Up Recommendations: Home health OT Equipment Recommended: Tub/shower seat   Skilled Therapeutic Intervention 1:1. Pt educated on OT role/purpose, CIR, ELOS and POC. Pt agreable to shower with IV and incision covered. Pt constipated and unbable to have BM seated on toilet. Pt demo decreased ability to sequence tasks specifically bathing body parts and unfamiliar transfers with RW using grab bars to get into shower. Pt also requires VC for use of soap as well as A for ADL tasks involving feet (bathing B feet, threading BLE into underwear and A for use of RUE in UB dressing/bathing). Pt minimally self limiting initally asking for A prior to attemtpting ADL tasks, but with encouragement able to particiapte. PT would benefit from AE training to imrpove independence with LB ADLs. Exited session iwht pt seated in bed, call light in reach and exit alarm on  OT Evaluation Precautions/Restrictions  Precautions Precautions: Fall Restrictions Weight Bearing Restrictions: Yes RUE Weight Bearing: Weight bearing as tolerated RLE Weight Bearing: Weight bearing as tolerated General Chart Reviewed: Yes Vital Signs Therapy Vitals Temp: 97.8 F (36.6 C) Pulse Rate: (!) 57 Resp: 16 BP: (!) 131/47 Patient Position (if appropriate): Sitting Oxygen Therapy SpO2: 95 % O2 Device: Room Air Pain Pain Assessment Pain Score: 3  Home Living/Prior Functioning Home Living Family/patient expects to be discharged to:: Assisted living Type of Home: Independent living facility Additional Comments: grab bars in shower IADL History Current License: Yes Mode of Transportation:  Car Leisure and Hobbies: bridge, exercise classes at [employer], works in the [employer] at [employer] Prior Function Level of Independence: Independent with basic ADLs(No AD) Comments: ADLs, IADLs, exercises, driving, and enjoys playing bridge.  ADL ADL Grooming: Supervision/safety Where Assessed-Grooming: Edge of bed Upper Body Bathing: Minimal assistance Where Assessed-Upper Body Bathing: Shower Lower Body Bathing: Moderate assistance Where Assessed-Lower Body Bathing: Shower Upper Body Dressing: Contact guard Where Assessed-Upper Body Dressing: Edge of bed Lower Body Dressing: Maximal assistance Where Assessed-Lower Body Dressing: Edge of bed Where Assessed-Toileting: Bedside Commode Toilet Transfer: Minimal assistance Toilet Transfer Method: Product/process development scientist Method: Heritage manager: Civil engineer, contracting with back Vision Baseline Vision/History: Wears glasses Wears Glasses: Reading only Patient Visual Report: No change from baseline Vision Assessment?: No apparent visual deficits Perception  Perception: Within Functional Limits Praxis Praxis: Intact Cognition Overall Cognitive Status: Within Functional Limits for tasks assessed Orientation Level: Person Year: 2019 Month: December Day of Week: Incorrect Immediate Memory Recall: Sock;Blue;Bed Memory Recall: Sock;Blue;Bed Memory Recall Sock: Without Cue Memory Recall Blue: Without Cue Memory Recall Bed: Without Cue Attention: Sustained;Selective Sustained Attention: Appears intact Selective Attention: Appears intact Safety/Judgment: Appears intact Sensation Sensation Light Touch: Appears Intact Coordination Gross Motor Movements are Fluid and Coordinated: No(R shoulder decreased ROM/guarded movement d/t pain) Fine Motor Movements are Fluid and Coordinated: Yes Motor  Motor Motor: Within Functional Limits Mobility  Transfers Sit to  Stand: Contact Guard/Touching assist Stand to Sit: Contact Guard/Touching assist  Trunk/Postural Assessment  Cervical Assessment Cervical Assessment: Exceptions to WFL(head forward) Thoracic Assessment Thoracic Assessment: Exceptions to WFL(kyphotic) Lumbar Assessment Lumbar Assessment: Exceptions to WFL(posterior pelvic tilt) Postural Control Postural Control: Deficits on evaluation(delayed)  Balance Balance Balance Assessed: Yes Dynamic Sitting Balance Dynamic Sitting - Level of Assistance: 5: Stand by assistance Dynamic Standing Balance Dynamic Standing - Level of Assistance: 4: Min assist Dynamic Standing - Comments: dressing/bathing Extremity/Trunk Assessment RUE Assessment RUE Assessment: Exceptions to [facility] Active Range of Motion (AROM) Comments: decreased  ROM at shoulder d/t fx 0-100* shoulder flextion/abduction  General Strength Comments: generalized weakness RUE>LUE LUE Assessment LUE Assessment: Exceptions to [facility] General Strength Comments: generalized weakness     Refer to Care Plan for Long Term Goals  Recommendations for other services: Therapeutic Recreation  Pet therapy and Outing/community reintegration   Discharge Criteria: Patient will be discharged from OT if patient refuses treatment 3 consecutive times without medical reason, if treatment goals not met, if there is a change in medical status, if patient makes no progress towards goals or if patient is discharged from hospital.  The above assessment, treatment plan, treatment alternatives and goals were discussed and mutually agreed upon: by patient  Tonny Branch 06/16/2018, 7:40 AM

## 2018-06-16 NOTE — Progress Notes (Signed)
Inpatient Rehabilitation  Patient information reviewed and entered into eRehab system by Emmalise Huard M. Danyel Tobey, M.A., CCC/SLP, PPS Coordinator.  Information including medical coding, functional ability and quality indicators will be reviewed and updated through discharge.    Per nursing patient was given "Data Collection Information Summary" for Patients in Inpatient Rehabilitation Facilities with attached "Privacy Act Statement-Health Care Records" upon admission.   

## 2018-06-16 NOTE — Evaluation (Signed)
Physical Therapy Assessment and Plan  Patient Details  Name: Jessica Bruce MRN: [id number] Date of Birth: 08/20/1922  PT Diagnosis: Difficulty walking, Edema, Muscle weakness and Pain in joint Rehab Potential: Good ELOS: 10-12 days   Today's Date: 06/16/2018 PT Individual Time: 1415-1530 PT Individual Time Calculation (min): 75 min    Problem List:  Patient Active Problem List   Diagnosis Date Noted  . Hypoalbuminemia due to protein-calorie malnutrition ([place])   . Hip fracture requiring operative repair ([place]) 06/15/2018  . Closed fracture of neck of right femur ([place])   . Drug induced constipation   . Acute blood loss anemia   . Benign essential HTN   . Anxiety   . Hyponatremia   . Closed right hip fracture, initial encounter ([place]) 06/11/2018  . Right clavicle fracture 06/11/2018  . Hypertension   . Hyperglycemia     Past Medical History:  Past Medical History:  Diagnosis Date  . Anxiety   . Closed right hip fracture, initial encounter ([place]) 06/11/2018  . Hyperglycemia   . Hypertension   . Osteoporosis    Past Surgical History:  Past Surgical History:  Procedure Laterality Date  . ANTERIOR APPROACH HEMI HIP ARTHROPLASTY Right 06/11/2018   Procedure: Right hip hemiarthroplasty direct anterior;  Surgeon: Mcarthur Rossetti, MD;  Location: [place];  Service: Orthopedics;  Laterality: Right;  . CHOLECYSTECTOMY    . COLON SURGERY     pre-cancerous polyp very remotely    Assessment & Plan Clinical Impression: Patient is a 84 y.o. year old female with history ofHTN, anxiety disorder,chronic dizziness; who fell down to steps while visiting family evening prior to admission on 06/11/2018 with injury to right shoulder as well as right hip and difficulty weightbearing the right lower extremity. She was found to have right femoral neck fracture and a nondisplaced distal right clavicle fracture. Right clavicle fracture to be treated conservatively per Ortho. She was  taken to the OR for right anterior hip arthroplasty by Dr. Ninfa Linden and postop to be weightbearing as tolerated with aspirin for DVT prophylaxis. She has had issues with constipation, AKI, hyponatremia as well as acute blood loss anemia. Therapy evaluations done revealing deficits in mobility and ADLs. CIR recommended for follow-up therapy.  Patient transferred to CIR on 06/15/2018 .   Patient currently requires min with mobility secondary to muscle weakness and muscle joint tightness and decreased standing balance and decreased balance strategies.  Prior to hospitalization, patient was independent  with mobility and lived with Alone(assisted living) in a Independent living facility home.  Home access is  Level entry.  Patient will benefit from skilled PT intervention to maximize safe functional mobility, minimize fall risk and decrease caregiver burden for planned discharge home alone.  Anticipate patient will benefit from follow up [place] at discharge.  PT - End of Session Activity Tolerance: Tolerates 30+ min activity with multiple rests Endurance Deficit: Yes PT Assessment Rehab Potential (ACUTE/IP ONLY): Good PT Patient demonstrates impairments in the following area(s): Behavior;Balance;Edema;Endurance;Motor;Pain;Safety PT Transfers Functional Problem(s): Bed Mobility;Car;Bed to Chair;Furniture;Floor PT Locomotion Functional Problem(s): Ambulation;Wheelchair Mobility;Stairs PT Plan PT Intensity: Minimum of 1-2 x/day ,45 to 90 minutes PT Frequency: 5 out of 7 days PT Duration Estimated Length of Stay: 10-12 days PT Treatment/Interventions: Ambulation/gait training;Disease management/prevention;Pain management;Stair training;Therapeutic Activities;DME/adaptive equipment instruction;Balance/vestibular training;Patient/family education;Therapeutic Exercise;Cognitive remediation/compensation;Functional mobility training;Skin care/wound management;UE/LE Strength taining/ROM;Discharge planning;UE/LE  Coordination activities;Neuromuscular re-education;Splinting/orthotics PT Transfers Anticipated Outcome(s): Mod I PT Locomotion Anticipated Outcome(s): Mod I PT Recommendation Recommendations for Other Services: Therapeutic Recreation  consult Follow Up Recommendations: Home health PT Patient destination: Home Equipment Recommended: To be determined  Skilled Therapeutic Intervention Evaluation completed (see details above and below) with education on PT POC and goals and individual treatment initiated with focus on functional mobility, gait, endurance, and hip strengthening. Pt seated in recliner upon PT arrival, agreeable to therapy tx and reports pain 3/10 in R hip. Pt performed stand pivot transfer with RW from recliner>w/c with min assist and transported to the gym. Pt performed stand pivot car transfer with min assist, verbal cues for techniques. Pt ambulated x 80 ft this session with RW and min assist, limited by fatigue. Pt participated in TUG this session as detailed below, 51.03 seconds with RW.  Pt instructed in LE strengthening exercises this session including standing R hip abduction, flexion and extension 2 x 10 each while standing in parallel bars. Pt performed 2 x 10 mini squats while standing with RW and pt performed 2 x 10 seated marches. Pt transported back to room and ambulated into bathroom with RW and min assist 2 x 10 ft. Left seated in recliner at end of session with needs in reach.    PT Evaluation Precautions/Restrictions Precautions Precautions: Fall Restrictions Weight Bearing Restrictions: Yes RUE Weight Bearing: Weight bearing as tolerated RLE Weight Bearing: Weight bearing as tolerated General   Vital SignsTherapy Vitals Temp: 98.2 F (36.8 C) Temp Source: Oral Pulse Rate: 60 Resp: 16 BP: (!) 124/49 Patient Position (if appropriate): Sitting Oxygen Therapy SpO2: 95 % O2 Device: Room Air Pain   pt reports pain 3/10 in R hip Home Living/Prior  Functioning Home Living Type of Home: Independent living facility Home Access: Level entry Home Layout: One level Bathroom Shower/Tub: Multimedia programmer: Handicapped height  Lives With: Alone(assisted living) Prior Function Level of Independence: Independent with basic ADLs;Independent with gait  Able to Take Stairs?: Yes(would take stair with a railing) Driving: Yes Vocation: Retired Leisure: Hobbies-yes (Comment) Comments: ADLs, IADLs, exercises, driving, and enjoys playing bridge.  Cognition Overall Cognitive Status: Within Functional Limits for tasks assessed Orientation Level: Oriented X4 Attention: Sustained;Selective Sustained Attention: Appears intact Selective Attention: Appears intact Safety/Judgment: Appears intact Sensation Sensation Light Touch: Appears Intact Proprioception: Appears Intact Coordination Gross Motor Movements are Fluid and Coordinated: No Fine Motor Movements are Fluid and Coordinated: Yes Coordination and Movement Description: impaired coordination R LE secondary to pain and edema from fx Motor  Motor Motor: Within Functional Limits Motor - Skilled Clinical Observations: generalized weakness  Mobility Transfers Transfers: Sit to Stand;Stand to Sit;Stand Pivot Transfers Stand to Sit: Minimal Assistance - Patient > 75% Stand Pivot Transfers: Contact Guard/Touching assist Transfer (Assistive device): Rolling walker Locomotion  Gait Ambulation: Yes Gait Assistance: Minimal Assistance - Patient > 75% Gait Distance (Feet): 80 Feet Assistive device: Rolling walker Gait Assistance Details: Verbal cues for technique;Verbal cues for precautions/safety Gait Gait: Yes Gait Pattern: Impaired Gait Pattern: Decreased step length - left;Decreased step length - right Gait velocity: decreased  Trunk/Postural Assessment  Cervical Assessment Cervical Assessment: Exceptions to WFL(forward head posture) Thoracic Assessment Thoracic  Assessment: Exceptions to WFL(rounded shoulders/kyphotic) Lumbar Assessment Lumbar Assessment: Exceptions to WFL(posterior pelvic tilt) Postural Control Postural Control: Deficits on evaluation(delayed reactions)  Balance Balance Balance Assessed: Yes Standardized Balance Assessment Standardized Balance Assessment: Timed Up and Go Test Timed Up and Go Test TUG: Normal TUG(with RW) Normal TUG (seconds): 51.03 Dynamic Sitting Balance Dynamic Sitting - Level of Assistance: 5: Stand by assistance Dynamic Standing Balance Dynamic Standing - Level of Assistance:  4: Min assist Extremity Assessment  RLE Assessment RLE Assessment: Exceptions to [facility] Passive Range of Motion (PROM) Comments: Hip PROM limited secondary to pain  Active Range of Motion (AROM) Comments: Hip AROM limited by pain/weakness, hip flexion to 100 degrees General Strength Comments: 3-/5 hip flexors and hip extensors, 2+/5 hip abductors, grossly 4/5 knee and ankle LLE Assessment LLE Assessment: Within Functional Limits General Strength Comments: grossly 4/5 throughout     Refer to Care Plan for Long Term Goals  Recommendations for other services: Therapeutic Recreation  Outing/community reintegration  Discharge Criteria: Patient will be discharged from PT if patient refuses treatment 3 consecutive times without medical reason, if treatment goals not met, if there is a change in medical status, if patient makes no progress towards goals or if patient is discharged from hospital.  The above assessment, treatment plan, treatment alternatives and goals were discussed and mutually agreed upon: by patient  Netta Corrigan, PT, DPT  06/16/2018, 3:08 PM

## 2018-06-16 NOTE — Progress Notes (Signed)
Social Work  Social Work Assessment and Plan  Patient Details  Name: Jessica Bruce MRN: [id number] Date of Birth: 12-05-1922  Today's Date: 06/16/2018  Problem List:  Patient Active Problem List   Diagnosis Date Noted  . Hip fracture requiring operative repair ([place]) 06/15/2018  . Closed fracture of neck of right femur ([place])   . Drug induced constipation   . Acute blood loss anemia   . Benign essential HTN   . Anxiety   . Hyponatremia   . Closed right hip fracture, initial encounter ([place]) 06/11/2018  . Right clavicle fracture 06/11/2018  . Hypertension   . Hyperglycemia    Past Medical History:  Past Medical History:  Diagnosis Date  . Anxiety   . Closed right hip fracture, initial encounter ([place]) 06/11/2018  . Hyperglycemia   . Hypertension   . Osteoporosis    Past Surgical History:  Past Surgical History:  Procedure Laterality Date  . ANTERIOR APPROACH HEMI HIP ARTHROPLASTY Right 06/11/2018   Procedure: Right hip hemiarthroplasty direct anterior;  Surgeon: Mcarthur Rossetti, MD;  Location: [facility];  Service: Orthopedics;  Laterality: Right;  . CHOLECYSTECTOMY    . COLON SURGERY     pre-cancerous polyp very remotely   Social History:  reports that she has never smoked. She has never used smokeless tobacco. She reports that she drinks about 3.0 standard drinks of alcohol per week. She reports that she does not use drugs.  Family / Support Systems Marital Status: Widow/Widower Patient Roles: Parent, Other (Comment)(Church Member) Children: Jessica Bruce [phone] Other Supports: Daughter in [place] and local grandson Anticipated Caregiver: Self and hired caregivers Ability/Limitations of Caregiver: See how pt does , daughter to hire assist Caregiver Availability: Other (Comment)(Depends upon what assist pt needs) Family Dynamics: Close knit with both of her daughter's although one in in [place]. She has a local daughter and grandson who are involved and supportive.  She has friends and church members who are supportive.  Social History Preferred language: English Religion: Protestant Cultural Background: No issues Education: High School Read: Yes Write: Yes Employment Status: Retired Freight forwarder Issues: No issues Guardian/Conservator: None-according to MD pt is capable of making her own decisions while here   Abuse/Neglect Abuse/Neglect Assessment Can Be Completed: Yes Physical Abuse: Denies Verbal Abuse: Denies Sexual Abuse: Denies Exploitation of patient/patient's resources: Denies Self-Neglect: Denies  Emotional Status Pt's affect, behavior adn adjustment status: Pt has always been independent and able to take care of herself, she hopes to do so again. She knows she will be limited due to her clavicle and hip fracture, but this will heal. She will need to be patient to allow them to heal and not do too much. Recent Psychosocial Issues: other health issues fairly healthy Pyschiatric History: History of anxiety takes medications for this and feels it is helpful. Deferred depression screen due to coping appropriately and ready to get her rehab started. Will monitor while here in case neuro-psych services needed Substance Abuse History: No issues  Patient / Family Perceptions, Expectations & Goals Pt/Family understanding of illness & functional limitations: Pt and daughter are able to explain her hip and clavicle injuries. They talk with the MD daily and feel they have a good understanding of her treatment plan going forward. Should do well here. Premorbid pt/family roles/activities: Mother, grandmother, church member, friend, etc Anticipated changes in roles/activities/participation: resume Pt/family expectations/goals: Pt states: " I want to be able to do as much as I can for myself before I leave  here."  Daughter states: " I hope she does well but will hire someone to assist her at home."  [employer]: Other (Comment)(resident at [facility] living apt) Premorbid Home Care/DME Agencies: None Transportation available at discharge: Pt and family  Discharge Planning Living Arrangements: Alone Support Systems: Children, Other relatives, Friends/neighbors, Church/faith community Type of Residence: [place] Name: [place] Resources: Chartered certified accountant Resources: Social Security, Family Support Financial Screen Referred: No Living Expenses: Rent Money Management: Patient Does the patient have any problems obtaining your medications?: No Home Management: Self and facility Patient/Family Preliminary Plans: Return back to her apartment with assist if needed. It will depend upon her level here and how much assist she will need at discharge. Will await therapy team evaluations and work with pt and daughter on safe plan for pt. Social Work Anticipated Follow Up Needs: HH/OP  Clinical Impression Pleasant female who is motivated to do well and get as independent as she can prior to going back to her independent living apartment. Her local daughter is involved and willing to do what she needs to do for her recovery. Pt is coping appropriately. Will work on discharge needs.  [name] 06/16/2018, 8:55 AM

## 2018-06-16 NOTE — Progress Notes (Signed)
[facility] PHYSICAL MEDICINE & REHABILITATION PROGRESS NOTE  Subjective/Complaints: Patient seen sitting up in bed this morning.  She states she slept well overnight.  She wants to know what she has to do to get ready for therapy this morning.  Discussed with nursing patient's constipation.  ROS: Denies CP, shortness of breath, nausea, vomiting, diarrhea.  Objective: Vital Signs: Blood pressure (!) 131/47, pulse (!) 57, temperature 97.8 F (36.6 C), resp. rate 16, height 5' (1.524 m), weight 83.7 kg, SpO2 95 %. No results found. Recent Labs    06/14/18 0242 06/15/18 0419 06/16/18 0501  WBC 10.0  --  9.2  HGB 10.9* 11.0* 10.9*  HCT 33.1* 34.4* 34.3*  PLT 163  --  189   Recent Labs    06/15/18 0419 06/16/18 0501  NA 132* 133*  K 4.6 4.3  CL 96* 94*  CO2 27 31  GLUCOSE 106* 122*  BUN 20 21  CREATININE 0.87 0.71  CALCIUM 9.9 9.9    Physical Exam: BP (!) 131/47 (BP Location: Right Arm)   Pulse (!) 57   Temp 97.8 F (36.6 C)   Resp 16   Ht 5' (1.524 m)   Wt 83.7 kg   SpO2 95%   BMI 36.04 kg/m  Constitutional: No distress . Vital signs reviewed. HENT: Normocephalic.  Atraumatic. Eyes: EOMI. No discharge. Cardiovascular: RRR. No JVD. Respiratory: CTA Bilaterally. Normal effort. GI: BS +. Non-distended. Musc: No edema or tenderness in extremities. GI:She exhibitsdistension.   Musculoskeletal: She exhibitsedema and tenderness right hip and shoulder. Neurological: She isalertand oriented  Motor: RUE limited proximally due to pain, distally 4-/5.  LUE 5/5.  LLE 4+/5 prox to distal.  RLE: 4-/5 HF, KE and 4/5 ADF/PF Skin: Resolving ecchymosis right clavicle.  Right hip with surgical foam dressing  Psychiatric: She has anormal mood and affect. Herbehavior is normal.Judgmentand thought contentnormal.   Assessment/Plan: 1. Functional deficits secondary to right femoral neck and right clavicle fracture status post right THA which require 3+ hours per day  of interdisciplinary therapy in a comprehensive inpatient rehab setting.  Physiatrist is providing close team supervision and 24 hour management of active medical problems listed below.  Physiatrist and rehab team continue to assess barriers to discharge/monitor patient progress toward functional and medical goals  Care Tool:  Bathing              Bathing assist       Upper Body Dressing/Undressing Upper body dressing        Upper body assist      Lower Body Dressing/Undressing Lower body dressing            Lower body assist       Toileting Toileting    Toileting assist Assist for toileting: Minimal Assistance - Patient > 75%     Transfers Chair/bed transfer  Transfers assist           Locomotion Ambulation   Ambulation assist              Walk 10 feet activity   Assist           Walk 50 feet activity   Assist           Walk 150 feet activity   Assist           Walk 10 feet on uneven surface  activity   Assist           Wheelchair     Assist  Wheelchair 50 feet with 2 turns activity    Assist            Wheelchair 150 feet activity     Assist            Medical Problem List and Plan: 1.Functional and mobility deficitssecondary to right femoral neck and right clavicle fractures. S/p right THA  Begin CIR  Notes reviewed-fall with shoulder hip fracture, labs reviewed 2. DVT Prophylaxis/Anticoagulation: Pharmaceutical:Lovenoxadded--change back to ASA at discharge. 3. Pain Management:controlled with tramadol qid. Continue tylenol,ice and robaxin prn.  4. Mood:Patient motivated to get better. LCSW to follow for evaluation and support. 5. Neuropsych: This patientiscapable of making decisions on herown behalf. 6. Skin/Wound Care:Monitor wound for healing. Continue nutritional supplements 7. Fluids/Electrolytes/Nutrition:Monitor I/O. Added Ensure  supplements as intake poor. 8. YQM:GNOIBBC BP bid--on Coreg, hydralazine, amlodipine----hyzaar on hold at this time. D/ced HCTZ. Now on lower dose coreg due to bradycardia. Continue to monitor HR bid.  Monitor with increased mobility 9. Anxiety disorder:Mood stable and managed with lexapro. 10. Insomnia: Resume melatonin.  11. ABLA:  Hemoglobin 10.9 on 12/4  Continue to monitor 12. Hyponateremia:Discontinued HCTZ.   Sodium 133 on 12/4  Continue to monitor  13.Constipation: Reported nausea with poor p.o. Intake  Started MiraLAX daily.  14.  Hypoalbuminemia  Supplement initiated on 12/4  LOS: 1 days A FACE TO FACE EVALUATION WAS PERFORMED  Areil Ottey [name] 06/16/2018, 9:14 AM

## 2018-06-16 NOTE — Care Management Note (Signed)
Inpatient [facility] Individual Statement of Services  Patient Name:  Jessica Bruce  Date:  06/16/2018  Welcome to the [facility].  Our goal is to provide you with an individualized program based on your diagnosis and situation, designed to meet your specific needs.  With this comprehensive rehabilitation program, you will be expected to participate in at least 3 hours of rehabilitation therapies Monday-Friday, with modified therapy programming on the weekends.  Your rehabilitation program will include the following services:  Physical Therapy (PT), Occupational Therapy (OT), 24 hour per day rehabilitation nursing, Case Management (Social Worker), Rehabilitation Medicine, Nutrition Services and Pharmacy Services  Weekly team conferences will be held on Wednesday to discuss your progress.  Your Social Worker will talk with you frequently to get your input and to update you on team discussions.  Team conferences with you and your family in attendance may also be held.  Expected length of stay: 7-10 days  Overall anticipated outcome: mod/i-supervision-bathing and tub transfers  Depending on your progress and recovery, your program may change. Your Social Worker will coordinate services and will keep you informed of any changes. Your Social Worker's name and contact numbers are listed  below.  The following services may also be recommended but are not provided by the [facility] will be made to provide these services after discharge if needed.  Arrangements include referral to agencies that provide these services.  Your insurance has been verified to be:  Medicare Part A & B Your primary doctor is:  Development worker, community  Pertinent information will be shared with your doctor and your insurance company.  Social Worker:  Ovidio Kin, [place] or [phone]  Information discussed with and copy given to patient by: [name], 06/16/2018, 8:57 AM

## 2018-06-16 NOTE — Patient Care Conference (Signed)
Inpatient RehabilitationTeam Conference and Plan of Care Update Date: 06/16/2018   Time: 2:25 PM    Patient Name: Jessica Bruce      Medical Record Number: [id number]  Date of Birth: [birth year] Sex: Female         Room/Bed: 4M09C/4M09C-01 Payor Info: Payor: MEDICARE / Plan: MEDICARE PART A AND B / Product Type: *No Product type* /    Admitting Diagnosis: Rt Hip fx  Admit Date/Time:  06/15/2018  6:49 PM Admission Comments: No comment available   Primary Diagnosis:  <principal problem not specified> Principal Problem: <principal problem not specified>  Patient Active Problem List   Diagnosis Date Noted  . Hypoalbuminemia due to protein-calorie malnutrition ([place])   . Hip fracture requiring operative repair ([place]) 06/15/2018  . Closed fracture of neck of right femur ([place])   . Drug induced constipation   . Acute blood loss anemia   . Benign essential HTN   . Anxiety   . Hyponatremia   . Closed right hip fracture, initial encounter ([place]) 06/11/2018  . Right clavicle fracture 06/11/2018  . Hypertension   . Hyperglycemia     Expected Discharge Date: Expected Discharge Date: 06/25/18  Team Members Present: Physician leading conference: Dr. Delice Lesch Social Worker Present: Ovidio Kin, LCSW Nurse Present: Benjie Karvonen, RN PT Present: Kennyth Lose, PT OT Present: Amy Rounds, OT PPS Coordinator present : Daiva Nakayama, RN, CRRN;Melissa Gertie Fey     Current Status/Progress Goal Weekly Team Focus  Medical   Functional and mobility deficits secondary to right femoral neck and right clavicle fractures. S/p right THA  Improve mobility, transfers, hyponatremia, HTN, constipation  See above   Bowel/Bladder   LBM 06/10/18, CONT B/B  ASSESS WITH TOILETING NEEDS, AS NEEDED. ADMINISTERED BOWEL REGIMEN 06/15/18  CONTINUE TO ASSESS WITH TOILETING    Swallow/Nutrition/ Hydration             ADL's     eval pending        Mobility     eval pending        Communication              Safety/Cognition/ Behavioral Observations            Pain   DENIES PAIN/ TOLERABLE PAIN 3/10  CONTINUE TO ASSESS FOR PAIN, ADMINISTERED PAIN REGIMEN AS NEEDED  CONTINUE TO ASSESS EACH SHIFT AND AS NEEDED.   Skin   RT HIP SURGICAL DRESSING, MINIMAL DRAINAGE.   CONTINUE TO ASSESS EVERY SHIFT AND AS NEEDED. NO NOTED S/S INFECTION   ASSESS SKIN DAILY AND AS NEEDED WILL FOLLOW MD ORDERS       *See Care Plan and progress notes for long and short-term goals.     Barriers to Discharge  Current Status/Progress Possible Resolutions Date Resolved   Physician    Medical stability;Home environment access/layout;Lack of/limited family support;Wound Care     See above  Therapies, follow labs, optimize BP/bowel meds      Nursing                  PT                    OT Decreased caregiver support;Lack of/limited family support                SLP                SW  Discharge Planning/Teaching Needs:    Back to [facility] independent living apartment with hired assist if needed.     Team Discussion:  Goals mod/i level. Currently min assist level. MD monitoring sodium, BP and bowel issues. Pain issues MD looking at meds. New evaluation  Revisions to Treatment Plan:  New eval [facility] 12/13    Continued Need for Acute Rehabilitation Level of Care: The patient requires daily medical management by a physician with specialized training in physical medicine and rehabilitation for the following conditions: Daily direction of a multidisciplinary physical rehabilitation program to ensure safe treatment while eliciting the highest outcome that is of practical value to the patient.: Yes Daily medical management of patient stability for increased activity during participation in an intensive rehabilitation regime.: Yes Daily analysis of laboratory values and/or radiology reports with any subsequent need for medication adjustment of medical intervention for : Post surgical problems;Blood  pressure problems;Other   I attest that I was present, lead the team conference, and concur with the assessment and plan of the team.   [name] 06/17/2018, 8:49 AM

## 2018-06-16 NOTE — Progress Notes (Signed)
Occupational Therapy Session Note  Patient Details  Name: Jessica Bruce MRN: [id number] Date of Birth: 1922-10-23  Today's Date: 06/16/2018 OT Individual Time: 1300-1400 OT Individual Time Calculation (min): 60 min    Short Term Goals: Week 1:  OT Short Term Goal 1 (Week 1): n/a d/t ELOS  Skilled Therapeutic Interventions/Progress Updates:  Pt seen for OT session focusing on AE education and ADL re-training. Pt in supine upon arrival, denied pain at rest and 4/10 pain with mobility. She reports having already recevied pain meds and agreeable to tx session. Throughout session, she completed functional ambulation and transfers with steadying assist using RW. Extensive education and demonstration provided regarding use of reacher and sock aid for LB dressing as pt unable to reach to thread clothing and decreased ROM for figure four positioning. Following demonstration, pt able to return demonstrate use of reacher to doff sock, however, required VCs for proper use of reacher following ~10 minute delay. Education and demonstration also provided for use of sock aid. PT demonstrated ability to functionally use sock aid, however, had great difficulty threading sock onto sock aid and difficulty requiring proper use of aid. Therefore, trialed other modified methods from AE. With large block placed under pt's feet, she was able to reach to thread socks with increased time and rest breaks. Pt voiced liking step stool vs use of AE. She ambulated into bathroom and completed toileting tasks with steadying assist and cuing for sequencing. Pt returned to recliner at end of session, left seated in recliner at end of session, all needs in reach.   Therapy Documentation Precautions:  Precautions Precautions: Fall Restrictions Weight Bearing Restrictions: Yes RUE Weight Bearing: Weight bearing as tolerated RLE Weight Bearing: Weight bearing as tolerated    Therapy/Group: Individual Therapy  Eisa Conaway,  Lorrene Graef L 06/16/2018, 1:49 PM

## 2018-06-16 NOTE — Plan of Care (Signed)
  Problem: Consults Goal: Nutrition Consult-if indicated Outcome: Not Progressing   Problem: RH BOWEL ELIMINATION Goal: RH STG MANAGE BOWEL WITH ASSISTANCE Description STG Manage Bowel with Assistance. Outcome: Not Progressing Goal: RH STG MANAGE BOWEL W/MEDICATION W/ASSISTANCE Description STG Manage Bowel with Medication with Assistance. Outcome: Not Progressing   Problem: RH BLADDER ELIMINATION Goal: RH STG MANAGE BLADDER WITH ASSISTANCE Description STG Manage Bladder With Assistance Outcome: Not Progressing   Problem: RH SKIN INTEGRITY Goal: RH STG SKIN FREE OF INFECTION/BREAKDOWN Outcome: Not Progressing Goal: RH STG MAINTAIN SKIN INTEGRITY WITH ASSISTANCE Description STG Maintain Skin Integrity With Assistance. Outcome: Not Progressing   Problem: RH SAFETY Goal: RH STG ADHERE TO SAFETY PRECAUTIONS W/ASSISTANCE/DEVICE Description STG Adhere to Safety Precautions With Assistance/Device. Outcome: Not Progressing Goal: RH STG DECREASED RISK OF FALL WITH ASSISTANCE Description STG Decreased Risk of Fall With Assistance. Outcome: Not Progressing   Problem: RH PAIN MANAGEMENT Goal: RH STG PAIN MANAGED AT OR BELOW PT'S PAIN GOAL Outcome: Not Progressing   Problem: RH KNOWLEDGE DEFICIT GENERAL Goal: RH STG INCREASE KNOWLEDGE OF SELF CARE AFTER HOSPITALIZATION Outcome: Not Progressing  New admit

## 2018-06-17 ENCOUNTER — Inpatient Hospital Stay (HOSPITAL_COMMUNITY): Payer: Medicare Other | Admitting: Physical Therapy

## 2018-06-17 ENCOUNTER — Inpatient Hospital Stay (HOSPITAL_COMMUNITY): Payer: Medicare Other | Admitting: Occupational Therapy

## 2018-06-17 MED ORDER — ENOXAPARIN SODIUM 40 MG/0.4ML ~~LOC~~ SOLN
40.0000 mg | SUBCUTANEOUS | Status: DC
  Administered 2018-06-17 – 2018-06-24 (×8): 40 mg via SUBCUTANEOUS
  Filled 2018-06-17 (×8): qty 0.4

## 2018-06-17 NOTE — Progress Notes (Signed)
[place] PHYSICAL MEDICINE & REHABILITATION PROGRESS NOTE  Subjective/Complaints: Patient seen sitting up in her chair this morning.  She states he slept fairly overnight.  She notes she had a bowel movement last night.  ROS: Denies CP, shortness of breath, nausea, vomiting, diarrhea.  Objective: Vital Signs: Blood pressure (!) 152/52, pulse 61, temperature 97.7 F (36.5 C), temperature source Oral, resp. rate 18, height 5' (1.524 m), weight 63.8 kg, SpO2 96 %. No results found. Recent Labs    06/15/18 0419 06/16/18 0501  WBC  --  9.2  HGB 11.0* 10.9*  HCT 34.4* 34.3*  PLT  --  189   Recent Labs    06/15/18 0419 06/16/18 0501  NA 132* 133*  K 4.6 4.3  CL 96* 94*  CO2 27 31  GLUCOSE 106* 122*  BUN 20 21  CREATININE 0.87 0.71  CALCIUM 9.9 9.9    Physical Exam: BP (!) 152/52 (BP Location: Left Arm)   Pulse 61   Temp 97.7 F (36.5 C) (Oral)   Resp 18   Ht 5' (1.524 m)   Wt 63.8 kg   SpO2 96%   BMI 27.47 kg/m  Constitutional: No distress . Vital signs reviewed. HENT: Normocephalic.  Atraumatic. Eyes: EOMI. No discharge. Cardiovascular: RRR.  No JVD. Respiratory: CTA bilaterally. Normal effort. GI: BS +. Non-distended. Musc: No edema or tenderness in extremities. DE:[id number].  Bowel sounds normal. Musculoskeletal: She exhibitsedema and tenderness right hip and shoulder. Neurological: She isalertand oriented  Motor: RUE limited proximally due to pain, distally 4-/5, improving.  LUE 5/5.  LLE 4+/5 prox to distal.  RLE: 4/5 HF, KE and 4+/5 ADF/PF Skin: Resolving ecchymosis right clavicle.  Right hip with surgical foam dressing  Psychiatric: She has anormal mood and affect. Herbehavior is normal.Judgmentand thought contentnormal.   Assessment/Plan: 1. Functional deficits secondary to right femoral neck and right clavicle fracture status post right THA which require 3+ hours per day of interdisciplinary therapy in a comprehensive inpatient  rehab setting.  Physiatrist is providing close team supervision and 24 hour management of active medical problems listed below.  Physiatrist and rehab team continue to assess barriers to discharge/monitor patient progress toward functional and medical goals  Care Tool:  Bathing    Body parts bathed by patient: Right arm, Chest, Abdomen, Front perineal area, Buttocks, Right upper leg, Face, Left arm, Left upper leg, Right lower leg, Left lower leg   Body parts bathed by helper: Left arm, Right lower leg, Left lower leg     Bathing assist Assist Level: Supervision/Verbal cueing     Upper Body Dressing/Undressing Upper body dressing   What is the patient wearing?: Pull over shirt    Upper body assist Assist Level: Supervision/Verbal cueing    Lower Body Dressing/Undressing Lower body dressing      What is the patient wearing?: Underwear/pull up, Pants     Lower body assist Assist for lower body dressing: Minimal Assistance - Patient > 75%     Toileting Toileting    Toileting assist Assist for toileting: Contact Guard/Touching assist     Transfers Chair/bed transfer  Transfers assist     Chair/bed transfer assist level: Contact Guard/Touching assist     Locomotion Ambulation   Ambulation assist      Assist level: Supervision/Verbal cueing Assistive device: Walker-rolling Max distance: 150   Walk 10 feet activity   Assist     Assist level: Supervision/Verbal cueing Assistive device: Walker-rolling   Walk 50 feet activity  Assist    Assist level: Supervision/Verbal cueing Assistive device: Walker-rolling    Walk 150 feet activity   Assist Walk 150 feet activity did not occur: Safety/medical concerns  Assist level: Supervision/Verbal cueing Assistive device: Walker-rolling    Walk 10 feet on uneven surface  activity   Assist Walk 10 feet on uneven surfaces activity did not occur: Safety/medical concerns          Wheelchair     Assist Will patient use wheelchair at discharge?: No             Wheelchair 50 feet with 2 turns activity    Assist            Wheelchair 150 feet activity     Assist            Medical Problem List and Plan: 1.Functional and mobility deficitssecondary to right femoral neck and right clavicle fractures. S/p right THA  Continue CIR 2. DVT Prophylaxis/Anticoagulation: Pharmaceutical:Lovenoxadded--change back to ASA at discharge. 3. Pain Management:controlled with tramadol qid. Continue tylenol,ice and robaxin prn.  4. Mood:Patient motivated to get better. LCSW to follow for evaluation and support. 5. Neuropsych: This patientiscapable of making decisions on herown behalf. 6. Skin/Wound Care:Monitor wound for healing. Continue nutritional supplements 7. Fluids/Electrolytes/Nutrition:Monitor I/O. Added Ensure supplements as intake poor. 8. WNU:UVOZDGU BP bid--on Coreg, hydralazine, amlodipine----hyzaar on hold at this time. D/ced HCTZ. Now on lower dose coreg due to bradycardia. Continue to monitor HR bid.  ?  Trending up.  Will consider further medication adjustments tomorrow if persistently elevated 9. Anxiety disorder:Mood stable and managed with lexapro. 10. Insomnia: Resumed melatonin.  11. ABLA:  Hemoglobin 10.9 on 12/4  Continue to monitor 12. Hyponateremia:Discontinued HCTZ.   Sodium 133 on 12/4  Labs ordered for tomorrow  Continue to monitor  13.Constipation: Reported nausea with poor p.o. Intake  Started MiraLAX daily.   Improving 14.  Hypoalbuminemia  Supplement initiated on 12/4  LOS: 2 days A FACE TO FACE EVALUATION WAS PERFORMED  Ankit [name] 06/17/2018, 12:53 PM

## 2018-06-17 NOTE — Progress Notes (Signed)
Lovenox adjustment:  Wt 84kg, scr ~1, scr 42 ml/min.  Increase lovenox to 40mg  SQ qday

## 2018-06-17 NOTE — Plan of Care (Signed)
  Problem: RH SAFETY Goal: RH STG ADHERE TO SAFETY PRECAUTIONS W/ASSISTANCE/DEVICE Description STG Adhere to Safety Precautions With Assistance/Device. Outcome: Progressing  Call light within reach, proper footwear,  Problem: RH PAIN MANAGEMENT Goal: RH STG PAIN MANAGED AT OR BELOW PT'S PAIN GOAL Outcome: Progressing  Administered pain regimen as scheduled and needed

## 2018-06-17 NOTE — Progress Notes (Signed)
Occupational Therapy Session Note  Patient Details  Name: ANMARIE FUKUSHIMA MRN: [id number] Date of Birth: Aug 27, 1922  Today's Date: 06/17/2018 OT Individual Time: 1420-1500 OT Individual Time Calculation (min): 40 min    Short Term Goals: Week 1:  OT Short Term Goal 1 (Week 1): n/a d/t ELOS  Skilled Therapeutic Interventions/Progress Updates:    Pt seen for OT session focusing on functional transfers and functional standing balance/endurance.  Pt sitting up in recliner upon arrival, denying pain and agreeable to tx session. She ambulated throughout session with RW and supervision. Sit>stands completed with supervision-occasional min A with VCs required for hand placement with pt having difficulty carrying over education btwn transfers. She ambulated to ADL apartment. Completed sit<>stands from low soft surface couch. Provided education and demonstration for shower stall transfer using RW. Following demonstration, pt able to return demonstrate with guarding assist. Discussed recommendation for supervision initially with bathing/dressing tasks with all other ADL goals set at mod I level. Pt voiced understanding but also easer to return to independent PLOF.  She then ambulated to therapy gym. Completed board game at table top level, standing without UE support. Supervision provided and pt able to tolerate ~10 minutes in standing before seated rest break.  She ambulated back to room at end of session, left seated in recliner with all needs in reach.   Therapy Documentation Precautions:  Precautions Precautions: Fall Restrictions Weight Bearing Restrictions: Yes RUE Weight Bearing: Weight bearing as tolerated RLE Weight Bearing: Weight bearing as tolerated   Therapy/Group: Individual Therapy  Laurieanne Galloway L 06/17/2018, 7:12 AM

## 2018-06-17 NOTE — Progress Notes (Signed)
Occupational Therapy Session Note  Patient Details  Name: Jessica Bruce MRN: [id number] Date of Birth: 1923-01-19  Today's Date: 06/17/2018 OT Individual Time: 1000-1100 OT Individual Time Calculation (min): 60 min    Short Term Goals: Week 1:  OT Short Term Goal 1 (Week 1): n/a d/t ELOS  Skilled Therapeutic Interventions/Progress Updates:      Pt seen for BADL retraining of toileting, bathing, and dressing with a focus on use of AE, balance, activity tolerance. See ADL documentation below for details.  Pt tolerated session well but did have difficulty with new motor learning using the reacher to donn clothing over feet.  Because she does not have hip precautions she may do better just reaching forward. She used the long handled sponge to reach her feet with min cues.  She felt too fatigued to try to don socks by reaching forward so assisted pt with that.  She did stand at sink with S to brush hair and teeth.  Pt resting in recliner with chair alarm on and all needs met.  Therapy Documentation Precautions:  Precautions Precautions: Fall Restrictions Weight Bearing Restrictions: Yes RUE Weight Bearing: Weight bearing as tolerated RLE Weight Bearing: Weight bearing as tolerated    Vital Signs:  Pain: Pain Assessment Pain Score: 0-No pain Faces Pain Scale: No hurt Patients Stated Pain Goal: 0 PAINAD (Pain Assessment in Advanced Dementia) Breathing: normal ADL: ADL Grooming: Supervision/safety Where Assessed-Grooming: Standing at sink Upper Body Bathing: Minimal cueing Where Assessed-Upper Body Bathing: Shower Lower Body Bathing: Minimal cueing Where Assessed-Lower Body Bathing: Shower Upper Body Dressing: Moderate cueing Where Assessed-Upper Body Dressing: Chair Lower Body Dressing: Maximal assistance Where Assessed-Lower Body Dressing: Chair Toileting: Maximal cueing Where Assessed-Toileting: Glass blower/designer: Minimal assistance, Minimal verbal cueing Toilet  Transfer Method: Counselling psychologist: Raised toilet seat Social research officer, government: Curator Method: Heritage manager: Shower seat with back   Therapy/Group: Individual Therapy  [name] 06/17/2018, 11:59 AM

## 2018-06-17 NOTE — Progress Notes (Signed)
Social Work Patient ID: Jessica Bruce, female   DOB: Apr 26, 1923, 84 y.o.   MRN: [id number] Met with pt and spoke with her daughter-jean via telephone to discuss team conference goals mod/i with some supervision level due to clavicle issue. Will work with [facility] regarding arranging follow up therapies. Will work on discharge needs.

## 2018-06-17 NOTE — Progress Notes (Signed)
Physical Therapy Session Note  Patient Details  Name: Jessica Bruce MRN: [id number] Date of Birth: 1923-01-17  Today's Date: 06/17/2018 PT Individual Time: 0800-0930 PT Individual Time Calculation (min): 90 min   Short Term Goals: Week 1:  PT Short Term Goal 1 (Week 1): Pt will perform transfers with supervision bed<>chair PT Short Term Goal 2 (Week 1): Pt will ambulate x 150 ft with RW and supervision  PT Short Term Goal 3 (Week 1): Pt will initiate stair training  Skilled Therapeutic Interventions/Progress Updates:    Patient received up in chair, very pleasant and willing to participate in PT session today. Able to complete all functional transfers with min guard and RW, and gait multiple distances of approximately 181ft with RW and S throughout session today but gait distance limited by fatigue. Able to toilet and perform pericare with close S. Rode Nustep on level 1 for B LE strengthening for 10 minutes with B LEs only. Able to perform sit to supine with S, performed functional exercises and stretches on mat table, then required MinA for supine to sit. Ambulated back to her room with RW and S, and was left up in the recliner with lap belt activated and nursing staff present.   Therapy Documentation Precautions:  Precautions Precautions: Fall Restrictions Weight Bearing Restrictions: Yes RUE Weight Bearing: Weight bearing as tolerated RLE Weight Bearing: Weight bearing as tolerated General:   Vital Signs:  Pain: Pain Assessment Pain Scale: 0-10 Pain Score: 0-No pain Faces Pain Scale: No hurt Patients Stated Pain Goal: 0 PAINAD (Pain Assessment in Advanced Dementia) Breathing: normal    Therapy/Group: Individual Therapy  Deniece Ree PT, DPT, CBIS  Supplemental Physical Therapist [facility]    Pager [phone] Acute Rehab Office [phone]   06/17/2018, 12:18 PM

## 2018-06-18 ENCOUNTER — Inpatient Hospital Stay (HOSPITAL_COMMUNITY): Payer: Medicare Other | Admitting: Physical Therapy

## 2018-06-18 ENCOUNTER — Inpatient Hospital Stay (HOSPITAL_COMMUNITY): Payer: Medicare Other | Admitting: Occupational Therapy

## 2018-06-18 DIAGNOSIS — G8918 Other acute postprocedural pain: Secondary | ICD-10-CM

## 2018-06-18 LAB — BASIC METABOLIC PANEL
Anion gap: 9 (ref 5–15)
BUN: 19 mg/dL (ref 8–23)
CO2: 30 mmol/L (ref 22–32)
CREATININE: 0.83 mg/dL (ref 0.44–1.00)
Calcium: 9.8 mg/dL (ref 8.9–10.3)
Chloride: 94 mmol/L — ABNORMAL LOW (ref 98–111)
GFR calc non Af Amer: >60 mL/min (ref >60)
Glucose, Bld: 110 mg/dL — ABNORMAL HIGH (ref 70–99)
Potassium: 4.3 mmol/L (ref 3.5–5.1)
SODIUM: 133 mmol/L — AB (ref 135–145)

## 2018-06-18 MED ORDER — HYDROCODONE-ACETAMINOPHEN 5-325 MG PO TABS
1.0000 | ORAL_TABLET | Freq: Two times a day (BID) | ORAL | Status: DC | PRN
  Administered 2018-06-18 – 2018-06-23 (×3): 1 via ORAL
  Filled 2018-06-18 (×4): qty 1

## 2018-06-18 NOTE — Plan of Care (Signed)
  Problem: Consults Goal: Nutrition Consult-if indicated Outcome: Progressing   Problem: RH BLADDER ELIMINATION Goal: RH STG MANAGE BLADDER WITH ASSISTANCE Description STG Manage Bladder With Assistance Outcome: Progressing   Problem: RH SKIN INTEGRITY Goal: RH STG MAINTAIN SKIN INTEGRITY WITH ASSISTANCE Description STG Maintain Skin Integrity With min Assistance.  Outcome: Progressing   Problem: RH SAFETY Goal: RH STG ADHERE TO SAFETY PRECAUTIONS W/ASSISTANCE/DEVICE Description STG Adhere to Safety Precautions With Assistance/Device. Outcome: Progressing Goal: RH STG DECREASED RISK OF FALL WITH ASSISTANCE Description STG Decreased Risk of Fall min With Assistance.  Outcome: Progressing   Problem: RH PAIN MANAGEMENT Goal: RH STG PAIN MANAGED AT OR BELOW PT'S PAIN GOAL Outcome: Not Progressing   Problem: RH KNOWLEDGE DEFICIT GENERAL Goal: RH STG INCREASE KNOWLEDGE OF SELF CARE AFTER HOSPITALIZATION Outcome: Progressing   Problem: Consults Goal: RH GENERAL PATIENT EDUCATION Description See Patient Education module for education specifics. Outcome: Progressing

## 2018-06-18 NOTE — Progress Notes (Signed)
Occupational Therapy Session Note  Patient Details  Name: Jessica Bruce MRN: [id number] Date of Birth: 04/14/1923  Today's Date: 06/18/2018 OT Individual Time: 1030-1115 OT Individual Time Calculation (min): 45 min    Short Term Goals: Week 1:  OT Short Term Goal 1 (Week 1): n/a d/t ELOS      Skilled Therapeutic Interventions/Progress Updates:    Pt received in bed stating she felt "terrible" and that her blood pressure was low.  Assessed BP in supine at 133/48.  Notified RN who came to room.  Had pt sit to EOB with S.  Blood pressure 144/ 55.  Pt did need to toilet. She ambulated with RW with CGA to toileted and completed toileting with CGA for safety due to her Low BP.  Pt ambulated back to room and was set up in recliner. It took several trials of pillows and feet rest to find a comfortable position for pt. Her daughters arrived. Discussed pt's progress and how well she has been doing. This am, pt was not able to do as much.  Pt resting in recliner with all needs met.  Therapy Documentation Precautions:  Precautions Precautions: Fall Restrictions Weight Bearing Restrictions: Yes RUE Weight Bearing: Weight bearing as tolerated RLE Weight Bearing: Weight bearing as tolerated    Vital Signs: Therapy Vitals Temp: 98.4 F (36.9 C) Temp Source: Oral Pulse Rate: 66 Resp: 16 BP: (!) 130/56 Patient Position (if appropriate): Sitting Oxygen Therapy SpO2: 95 % O2 Device: Room Air   Pain:  mild R hip pain, pt was medicated  ADL: ADL Grooming: Supervision/safety Where Assessed-Grooming: Standing at sink Upper Body Bathing: Minimal cueing Where Assessed-Upper Body Bathing: Shower Lower Body Bathing: Minimal cueing Where Assessed-Lower Body Bathing: Shower Upper Body Dressing: Moderate cueing Where Assessed-Upper Body Dressing: Chair Lower Body Dressing: Maximal assistance Where Assessed-Lower Body Dressing: Chair Toileting: Maximal cueing Where Assessed-Toileting:  Toilet Toilet Transfer: Minimal assistance, Minimal verbal cueing Toilet Transfer Method: Ambulating Toilet Transfer Equipment: Raised toilet seat Walk-In Shower Transfer: Contact guard Walk-In Shower Transfer Method: Ambulating Walk-In Shower Equipment: Shower seat with back     Therapy/Group: Individual Therapy  SAGUIER,JULIA 06/18/2018, 1:22 PM 

## 2018-06-18 NOTE — Progress Notes (Signed)
[place] PHYSICAL MEDICINE & REHABILITATION PROGRESS NOTE  Subjective/Complaints: Patient seen sitting up in bed this morning.  She states she slept well overnight.  She is a little disappointed because she thought she would be progressing faster.  ROS: Denies CP, shortness of breath, nausea, vomiting, diarrhea.  Objective: Vital Signs: Blood pressure (!) 116/57, pulse 87, temperature 98.5 F (36.9 C), temperature source Oral, resp. rate 17, height 5' (1.524 m), weight 63.3 kg, SpO2 96 %. No results found. Recent Labs    06/16/18 0501  WBC 9.2  HGB 10.9*  HCT 34.3*  PLT 189   Recent Labs    06/16/18 0501 06/18/18 0528  NA 133* 133*  K 4.3 4.3  CL 94* 94*  CO2 31 30  GLUCOSE 122* 110*  BUN 21 19  CREATININE 0.71 0.83  CALCIUM 9.9 9.8    Physical Exam: BP (!) 116/57 (BP Location: Left Arm)   Pulse 87   Temp 98.5 F (36.9 C) (Oral)   Resp 17   Ht 5' (1.524 m)   Wt 63.3 kg   SpO2 96%   BMI 27.25 kg/m  Constitutional: No distress . Vital signs reviewed. HENT: Normocephalic.  Atraumatic. Eyes: EOMI. No discharge. Cardiovascular: RRR.  No JVD. Respiratory: CTA bilaterally.  Normal effort. GI: BS +. Non-distended. Musc: No edema or tenderness in extremities. XL:[id number].  Bowel sounds normal. Musculoskeletal: She exhibitsedema and tenderness right hip and shoulder. Neurological: She isalertand oriented  Motor: RUE limited proximally due to pain, distally 4/5.  LUE 5/5.  LLE 4+/5 prox to distal.  RLE: 4/5 HF, KE and 4+/5 ADF/PF, stable Skin: Resolving ecchymosis right clavicle.  Right hip with surgical foam dressing  Psychiatric: She has anormal mood and affect. Herbehavior is normal.Judgmentand thought contentnormal.   Assessment/Plan: 1. Functional deficits secondary to right femoral neck and right clavicle fracture status post right THA which require 3+ hours per day of interdisciplinary therapy in a comprehensive inpatient rehab  setting.  Physiatrist is providing close team supervision and 24 hour management of active medical problems listed below.  Physiatrist and rehab team continue to assess barriers to discharge/monitor patient progress toward functional and medical goals  Care Tool:  Bathing    Body parts bathed by patient: Right arm, Chest, Abdomen, Front perineal area, Buttocks, Right upper leg, Face, Left arm, Left upper leg, Right lower leg, Left lower leg   Body parts bathed by helper: Left arm, Right lower leg, Left lower leg     Bathing assist Assist Level: Supervision/Verbal cueing     Upper Body Dressing/Undressing Upper body dressing   What is the patient wearing?: Pull over shirt    Upper body assist Assist Level: Supervision/Verbal cueing    Lower Body Dressing/Undressing Lower body dressing      What is the patient wearing?: Underwear/pull up, Pants     Lower body assist Assist for lower body dressing: Minimal Assistance - Patient > 75%     Toileting Toileting    Toileting assist Assist for toileting: Contact Guard/Touching assist     Transfers Chair/bed transfer  Transfers assist     Chair/bed transfer assist level: Contact Guard/Touching assist     Locomotion Ambulation   Ambulation assist      Assist level: Supervision/Verbal cueing Assistive device: Walker-rolling Max distance: 150   Walk 10 feet activity   Assist     Assist level: Supervision/Verbal cueing Assistive device: Walker-rolling   Walk 50 feet activity   Assist    Assist level:  Supervision/Verbal cueing Assistive device: Walker-rolling    Walk 150 feet activity   Assist Walk 150 feet activity did not occur: Safety/medical concerns  Assist level: Supervision/Verbal cueing Assistive device: Walker-rolling    Walk 10 feet on uneven surface  activity   Assist Walk 10 feet on uneven surfaces activity did not occur: Safety/medical concerns          Wheelchair     Assist Will patient use wheelchair at discharge?: No             Wheelchair 50 feet with 2 turns activity    Assist            Wheelchair 150 feet activity     Assist            Medical Problem List and Plan: 1.Functional and mobility deficitssecondary to right femoral neck and right clavicle fractures. S/p right THA  Continue CIR 2. DVT Prophylaxis/Anticoagulation: Pharmaceutical:Lovenoxadded--change back to ASA at discharge. 3. Pain Management:controlled with tramadol qid. Continue tylenol,ice and robaxin prn.  4. Mood:Patient motivated to get better. LCSW to follow for evaluation and support. 5. Neuropsych: This patientiscapable of making decisions on herown behalf. 6. Skin/Wound Care:Monitor wound for healing. Continue nutritional supplements 7. Fluids/Electrolytes/Nutrition:Monitor I/O. Added Ensure supplements as intake poor. 8. ALP:FXTKWIO BP bid--on Coreg, hydralazine, amlodipine----hyzaar on hold at this time. D/ced HCTZ. Now on lower dose coreg due to bradycardia. Continue to monitor HR bid.  ?  Stabilizing on 12/6 9. Anxiety disorder:Mood stable and managed with lexapro. 10. Insomnia: Resumed melatonin.  11. ABLA:  Hemoglobin 10.9 on 12/4  Continue to monitor 12. Hyponateremia:Discontinued HCTZ.   Sodium 133 on 12/6  Continue to monitor  13.Constipation: Reported nausea with poor p.o. Intake  Started MiraLAX daily.   Improving 14.  Hypoalbuminemia  Supplement initiated on 12/4  LOS: 3 days A FACE TO FACE EVALUATION WAS PERFORMED  Alexanderia Gorby [name] 06/18/2018, 12:45 PM

## 2018-06-18 NOTE — Progress Notes (Signed)
Occupational Therapy Session Note  Patient Details  Name: Jessica Bruce MRN: [id number] Date of Birth: July 22, 1922  Today's Date: 06/18/2018 OT Individual Time: 1300-1400 OT Individual Time Calculation (min): 60 min    Short Term Goals: Week 1:  OT Short Term Goal 1 (Week 1): n/a d/t ELOS  Skilled Therapeutic Interventions/Progress Updates:    Pt seen for OT ADL bathing/dressing session. Pt sitting up in recliner upon arrival, denying pain and agreeable to tx session. Desiring to take shower.  Throughout session, pt completed functional ambulation using RW with close supervision. Functional transfers completed sit<>stand with RW and VCs for hand placement during transfer. She bathed seated on shower chair. LH sponge to reach LEs and VCs for sequencing.  She returned to toilet to dress. Instruction provided regarding use of reacher to assist with LB clothing management, pt not carrying over from previous sessions. Foot propped on foot stool for increased ease with task. Required VCs for sequencing of dressing task/ encouragement for independence. Need to cont to problem solve sock donning method, sock aid unsuccessful and pt unable to reach to feet to wash.  She was left set-up at sink to complete grooming tasks, next therapist coming in 15 minutes, nursing made aware of pt's position.   Therapy Documentation Precautions:  Precautions Precautions: Fall Restrictions Weight Bearing Restrictions: Yes RUE Weight Bearing: Weight bearing as tolerated RLE Weight Bearing: Weight bearing as tolerated   Therapy/Group: Individual Therapy  Lorelee Mclaurin L 06/18/2018, 7:06 AM

## 2018-06-18 NOTE — Progress Notes (Signed)
Physical Therapy Session Note  Patient Details  Name: Jessica Bruce MRN: [id number] Date of Birth: 08-16-1922  Today's Date: 06/18/2018 PT Individual Time: 1415-1535 PT Individual Time Calculation (min): 80 min   Short Term Goals: Week 1:  PT Short Term Goal 1 (Week 1): Pt will perform transfers with supervision bed<>chair PT Short Term Goal 2 (Week 1): Pt will ambulate x 150 ft with RW and supervision  PT Short Term Goal 3 (Week 1): Pt will initiate stair training  Skilled Therapeutic Interventions/Progress Updates:    Patient received in [facility], perseverating on doing her hair and makeup but able to be easily redirected to therapy activities. Transported to day room in [place] and transferred to Nustep with min guard, RW, and cues for safety and sequencing. Able to tolerate riding Nustep for 10 minutes on level 1 with B LEs only with improved speed and tolerance to machine/exercise, then transferred back to [facility] with RW and min guard. Able to gait train approximately 139f with RW and S, cues for upright posture, before becoming limited by fatigue. She was then taken to PT gym and transferred to mat table with RW and min guard, required MinA for sit to supine today and participated in strengthening exercises for bilateral hip abductors, flexors, and extensors. Performed supine to sit with min guard and participated in balance/cognitive activity of playing Jenga without  B UE support and min guard for safety. [facility] to ambulate into bathroom and toileted/performed pericare with S. She was left up in her recliner with chair alarm active and all needs met this afternoon.   Therapy Documentation Precautions:  Precautions Precautions: Fall Restrictions Weight Bearing Restrictions: Yes RUE Weight Bearing: Weight bearing as tolerated RLE Weight Bearing: Weight bearing as tolerated General:   Pain: Pain Assessment Pain Scale: 0-10 Pain Score: 3  Pain  Type: Acute pain Pain Location: Hip Pain Orientation: Right Pain Descriptors / Indicators: Sore Pain Onset: On-going Patients Stated Pain Goal: 0 Pain Intervention(s): Repositioned;Ambulation/increased activity;Emotional support Multiple Pain Sites: No    Therapy/Group: Individual Therapy  KDeniece ReePT, DPT, CBIS  Supplemental Physical Therapist [facility]   Pager [phone] Rehab Office [phone]  06/18/2018, 3:44 PM

## 2018-06-19 DIAGNOSIS — S72001D Fracture of unspecified part of neck of right femur, subsequent encounter for closed fracture with routine healing: Secondary | ICD-10-CM

## 2018-06-19 NOTE — Plan of Care (Signed)
  Problem: RH BOWEL ELIMINATION Goal: RH STG MANAGE BOWEL W/MEDICATION W/ASSISTANCE Description STG Manage Bowel with Medication with min Assistance.  Outcome: Progressing  LBM 06/16/18, administered bowel regimen as scheduled and prn

## 2018-06-19 NOTE — Progress Notes (Signed)
[place] PHYSICAL MEDICINE & REHABILITATION PROGRESS NOTE  Subjective/Complaints:  No issues overnite, states that starting yesterday she has noted some increased swelling in Right thigh, no pain.  Was up more yesterday  ROS: Denies CP, shortness of breath, nausea, vomiting, diarrhea.  Objective: Vital Signs: Blood pressure (!) 145/56, pulse (!) 56, temperature 98.1 F (36.7 C), temperature source Oral, resp. rate 15, height 5' (1.524 m), weight 63.8 kg, SpO2 96 %. No results found. No results for input(s): WBC, HGB, HCT, PLT in the last 72 hours. Recent Labs    06/18/18 0528  NA 133*  K 4.3  CL 94*  CO2 30  GLUCOSE 110*  BUN 19  CREATININE 0.83  CALCIUM 9.8    Physical Exam: BP (!) 145/56 (BP Location: Left Arm)   Pulse (!) 56   Temp 98.1 F (36.7 C) (Oral)   Resp 15   Ht 5' (1.524 m)   Wt 63.8 kg   SpO2 96%   BMI 27.47 kg/m  Constitutional: No distress . Vital signs reviewed. HENT: Normocephalic.  Atraumatic. Eyes: EOMI. No discharge. Cardiovascular: RRR.  No JVD. Respiratory: CTA bilaterally.  Normal effort. GI: BS +. Non-distended. Musc: No edema or tenderness in extremities. JJ:[id number].  Bowel sounds normal. Musculoskeletal: She exhibitsedema and tenderness right hip and shoulder. Neurological: She isalertand oriented  Motor: RUE limited proximally due to pain, distally 4/5.  LUE 5/5.  LLE 4+/5 prox to distal.  RLE: 4/5 HF, KE and 4+/5 ADF/PF, stable Skin: Resolving ecchymosis right clavicle.  Right hip with surgical foam dressing , ecchymosis purple and green, pitting edema down to R knee, no erythema, appropriately tender around incisions Psychiatric: She has anormal mood and affect. Herbehavior is normal.Judgmentand thought contentnormal.   Assessment/Plan: 1. Functional deficits secondary to right femoral neck and right clavicle fracture status post right THA which require 3+ hours per day of interdisciplinary therapy in a  comprehensive inpatient rehab setting.  Physiatrist is providing close team supervision and 24 hour management of active medical problems listed below.  Physiatrist and rehab team continue to assess barriers to discharge/monitor patient progress toward functional and medical goals  Care Tool:  Bathing    Body parts bathed by patient: Right arm, Chest, Abdomen, Front perineal area, Buttocks, Right upper leg, Face, Left arm, Left upper leg, Right lower leg, Left lower leg   Body parts bathed by helper: Left arm, Right lower leg, Left lower leg     Bathing assist Assist Level: Contact Guard/Touching assist     Upper Body Dressing/Undressing Upper body dressing   What is the patient wearing?: Pull over shirt    Upper body assist Assist Level: Set up assist    Lower Body Dressing/Undressing Lower body dressing      What is the patient wearing?: Underwear/pull up, Pants     Lower body assist Assist for lower body dressing: Minimal Assistance - Patient > 75%     Toileting Toileting    Toileting assist Assist for toileting: Contact Guard/Touching assist     Transfers Chair/bed transfer  Transfers assist     Chair/bed transfer assist level: Contact Guard/Touching assist     Locomotion Ambulation   Ambulation assist      Assist level: Supervision/Verbal cueing Assistive device: Walker-rolling Max distance: 150   Walk 10 feet activity   Assist     Assist level: Supervision/Verbal cueing Assistive device: Walker-rolling   Walk 50 feet activity   Assist    Assist level: Supervision/Verbal cueing Assistive  device: Walker-rolling    Walk 150 feet activity   Assist Walk 150 feet activity did not occur: Safety/medical concerns  Assist level: Supervision/Verbal cueing Assistive device: Walker-rolling    Walk 10 feet on uneven surface  activity   Assist Walk 10 feet on uneven surfaces activity did not occur: Safety/medical concerns          Wheelchair     Assist Will patient use wheelchair at discharge?: No             Wheelchair 50 feet with 2 turns activity    Assist            Wheelchair 150 feet activity     Assist            Medical Problem List and Plan: 1.Functional and mobility deficitssecondary to right femoral neck and right clavicle fractures. S/p right THA due to fall 06/11/18  Continue CIR PT, OT 2. DVT Prophylaxis/Anticoagulation: Pharmaceutical:Lovenoxadded--change back to ASA at discharge.R thigh swelling is primarily pitting edema, will monitor  3. Pain Management:controlled with tramadol qid. Continue tylenol,ice and robaxin prn.  4. Mood:Patient motivated to get better. LCSW to follow for evaluation and support. 5. Neuropsych: This patientiscapable of making decisions on herown behalf. 6. Skin/Wound Care:Monitor wound for healing. Continue nutritional supplements 7. Fluids/Electrolytes/Nutrition:Monitor I/O. Added Ensure supplements as intake poor. 8. QTM:AUQJFHL BP bid--on Coreg, hydralazine, amlodipine----hyzaar on hold at this time. D/ced HCTZ. Now on lower dose coreg due to bradycardia. Continue to monitor HR bid.   Vitals:   06/18/18 1958 06/19/18 0423  BP: 122/60 (!) 145/56  Pulse: 68 (!) 56  Resp: 16 15  Temp: 98.5 F (36.9 C) 98.1 F (36.7 C)  SpO2: 97% 96%  elevated this am 12/7 will monitor 9. Anxiety disorder:Mood stable and managed with lexapro. 10. Insomnia: Resumed melatonin.  11. ABLA:  Hemoglobin 10.9 on 12/4  Continue to monitor 12. Hyponateremia:Discontinued HCTZ.   Sodium 133 on 12/6  Continue to monitor  13.Constipation: Reported nausea with poor p.o. Intake  Started MiraLAX daily.   Improving 14.  Hypoalbuminemia  Supplement initiated on 12/4  LOS: 4 days A FACE TO [place] E Jessica Bruce 06/19/2018, 8:21 AM

## 2018-06-20 ENCOUNTER — Inpatient Hospital Stay (HOSPITAL_COMMUNITY): Payer: Medicare Other

## 2018-06-20 NOTE — Progress Notes (Signed)
Occupational Therapy Session Note  Patient Details  Name: Jessica Bruce MRN: [id number] Date of Birth: 1923-04-09  Today's Date: 06/20/2018 OT Individual Time: 1045-1200 OT Individual Time Calculation (min): 75 min    Short Term Goals: Week 1:  OT Short Term Goal 1 (Week 1): n/a d/t ELOS  Skilled Therapeutic Interventions/Progress Updates:    1:1. Pt with no c/o pain. Pt agreeable to bathe and dress at shower level this date with occlusive applied to incision site. Pt completes all mobility with CGA and VC for safe reach back to surface transfering onto. Pt has BM and completes toileting with CGA. Pt frequently unsure of self and asking, "whats next" or "is this right" when toilet paper still soiled or sequencing bathing/dressing. Pt bathes with CGA for standing only to wash buttocks and uses LHSS to wash BLE. Pt dresses with supervision for UB and CGA for pants/underwear advancing past hips. OT dons teds and Pt uses shoe horn to don shoes. RLE still swollen making shor uncomfortable. Pt elects to don non slip socks with A to don R sock. Pt stands to play game of life sized connect 4 with max VC for strategy and supervision with RW for static standing balance. Exited session with family present supervising pt eat breakfast while seated in w/c  Therapy Documentation Precautions:  Precautions Precautions: Fall Restrictions Weight Bearing Restrictions: Yes RUE Weight Bearing: Weight bearing as tolerated RLE Weight Bearing: Weight bearing as tolerated General:    Therapy/Group: Individual Therapy  Tonny Branch 06/20/2018, 12:05 PM

## 2018-06-20 NOTE — Progress Notes (Signed)
[place] PHYSICAL MEDICINE & REHABILITATION PROGRESS NOTE  Subjective/Complaints:  Did well with PT today, BM after laxative yesterday  ROS: Denies CP, shortness of breath, nausea, vomiting, diarrhea.  Objective: Vital Signs: Blood pressure (!) 143/55, pulse 64, temperature (!) 97.5 F (36.4 C), temperature source Oral, resp. rate 16, height 5' (1.524 m), weight 64 kg, SpO2 97 %. No results found. No results for input(s): WBC, HGB, HCT, PLT in the last 72 hours. Recent Labs    06/18/18 0528  NA 133*  K 4.3  CL 94*  CO2 30  GLUCOSE 110*  BUN 19  CREATININE 0.83  CALCIUM 9.8    Physical Exam: BP (!) 143/55 (BP Location: Left Arm)   Pulse 64   Temp (!) 97.5 F (36.4 C) (Oral)   Resp 16   Ht 5' (1.524 m)   Wt 64 kg   SpO2 97%   BMI 27.56 kg/m  Constitutional: No distress . Vital signs reviewed. HENT: Normocephalic.  Atraumatic. Eyes: EOMI. No discharge. Cardiovascular: RRR.  No JVD. Respiratory: CTA bilaterally.  Normal effort. GI: BS +. Non-distended. Musc: No edema or tenderness in extremities. DJ:[id number].  Bowel sounds normal. Musculoskeletal: She exhibitsedema and tenderness right hip and shoulder. Neurological: She isalertand oriented  Motor: RUE limited proximally due to pain, distally 4/5.  LUE 5/5.  LLE 4+/5 prox to distal.  RLE: 4/5 HF, KE and 4+/5 ADF/PF, stable Skin: Resolving ecchymosis right clavicle.  Right hip with surgical foam dressing , ecchymosis purple and green, pitting edema down to R knee, no erythema, appropriately tender around incisions Psychiatric: She has anormal mood and affect. Herbehavior is normal.Judgmentand thought contentnormal.   Assessment/Plan: 1. Functional deficits secondary to right femoral neck and right clavicle fracture status post right THA which require 3+ hours per day of interdisciplinary therapy in a comprehensive inpatient rehab setting.  Physiatrist is providing close team supervision and 24  hour management of active medical problems listed below.  Physiatrist and rehab team continue to assess barriers to discharge/monitor patient progress toward functional and medical goals  Care Tool:  Bathing    Body parts bathed by patient: Right arm, Chest, Abdomen, Front perineal area, Buttocks, Right upper leg, Face, Left arm, Left upper leg, Right lower leg, Left lower leg   Body parts bathed by helper: Left arm, Right lower leg, Left lower leg     Bathing assist Assist Level: Contact Guard/Touching assist     Upper Body Dressing/Undressing Upper body dressing   What is the patient wearing?: Pull over shirt    Upper body assist Assist Level: Set up assist    Lower Body Dressing/Undressing Lower body dressing      What is the patient wearing?: Underwear/pull up, Pants     Lower body assist Assist for lower body dressing: Minimal Assistance - Patient > 75%     Toileting Toileting    Toileting assist Assist for toileting: Contact Guard/Touching assist     Transfers Chair/bed transfer  Transfers assist     Chair/bed transfer assist level: Minimal Assistance - Patient > 75%     Locomotion Ambulation   Ambulation assist      Assist level: Supervision/Verbal cueing Assistive device: Walker-rolling Max distance: 150   Walk 10 feet activity   Assist     Assist level: Supervision/Verbal cueing Assistive device: Walker-rolling   Walk 50 feet activity   Assist    Assist level: Supervision/Verbal cueing Assistive device: Walker-rolling    Walk 150 feet activity  Assist Walk 150 feet activity did not occur: Safety/medical concerns  Assist level: Supervision/Verbal cueing Assistive device: Walker-rolling    Walk 10 feet on uneven surface  activity   Assist Walk 10 feet on uneven surfaces activity did not occur: Safety/medical concerns         Wheelchair     Assist Will patient use wheelchair at discharge?: No              Wheelchair 50 feet with 2 turns activity    Assist            Wheelchair 150 feet activity     Assist            Medical Problem List and Plan: 1.Functional and mobility deficitssecondary to right femoral neck and right clavicle fractures. S/p right THA due to fall 06/11/18  Continue CIR PT, OT 2. DVT Prophylaxis/Anticoagulation: Pharmaceutical:Lovenoxadded--change back to ASA at discharge.R thigh swelling is primarily pitting edema, will monitor  3. Pain Management:controlled with tramadol qid. Continue tylenol,ice and robaxin prn.  4. Mood:Patient motivated to get better. LCSW to follow for evaluation and support. 5. Neuropsych: This patientiscapable of making decisions on herown behalf. 6. Skin/Wound Care:Monitor wound for healing. Continue nutritional supplements 7. Fluids/Electrolytes/Nutrition:Monitor I/O. Added Ensure supplements as intake poor. 8. IBB:CWUGQBV BP bid--on Coreg, hydralazine, amlodipine----hyzaar on hold at this time. D/ced HCTZ. Now on lower dose coreg due to bradycardia. Continue to monitor HR bid.   Vitals:   06/20/18 0352 06/20/18 0514  BP: (!) 142/51 (!) 143/55  Pulse: 63 64  Resp: 18 16  Temp: (!) 97.5 F (36.4 C) (!) 97.5 F (36.4 C)  SpO2:  97%  fair control 12/8 9. Anxiety disorder:Mood stable and managed with lexapro. 10. Insomnia: Resumed melatonin.  11. ABLA:  Hemoglobin 10.9 on 12/4  Continue to monitor 12. Hyponateremia:Discontinued HCTZ.   Sodium 133 on 12/6  Continue to monitor  13.Constipation: Reported nausea with poor p.o. Intake  Started MiraLAX daily.   Improving 14.  Hypoalbuminemia  Supplement initiated on 12/4  LOS: 5 days A FACE TO FACE EVALUATION WAS PERFORMED  Charlett Blake 06/20/2018, 8:29 AM

## 2018-06-20 NOTE — Progress Notes (Signed)
Physical Therapy Session Note  Patient Details  Name: Jessica Bruce MRN: [id number] Date of Birth: 1923/06/26  Today's Date: 06/20/2018 PT Individual Time: 0800-0900 and 1500-1555 PT Individual Time Calculation (min): 60 min and 55 min  Short Term Goals: Week 1:  PT Short Term Goal 1 (Week 1): Pt will perform transfers with supervision bed<>chair PT Short Term Goal 2 (Week 1): Pt will ambulate x 150 ft with RW and supervision  PT Short Term Goal 3 (Week 1): Pt will initiate stair training  Skilled Therapeutic Interventions/Progress Updates:    Session 1: Pt supine in bed upon PT arrival, agreeable to therapy tx and denies pain at rest. Pt transferred to sitting EOB with supervision and ambulated from bed<>bathroom with RW and CGA, pt performs clothing management and peri care with supervision/stand by assist for balance. Pt ambulated to the sink, maintained standing balance with CGA while washing hands, brushing teeth and donning hearing aides. Pt donned sweater and ambulated x 200 ft to the gym with RW and CGA. Pt transferred onto mat and performed LE exercises for strengthening, 2 x 10 each including: heel slides, active assist heel slides, bridges, active assisted sidelying hip abduction and SAQ. PT transferred to sitting with min assist and transferred to w/c with min guard and RW. Pt transported to dayroom and transferred w/c<>nustep with CGA. Pt used nustep x 8 minutes on workload 4 working on strength and LE ROM. Pt transported back to room and ambulated to recliner x 5 ft with RW. Pt left seated in recliner with needs in reach and chair alarm set, family present.   Session 2: Pt seated in w/c upon PT arrival, agreeable to therapy tx and denies pain at rest. Pt transported to the gym. Pt ambulated 2 x 160 ft this session working on endurance/activity tolerance, CGA. Pt performed 2 x 10 sit<>stands this session for LE strengthening, verbal cues for techniques. Pt performed LE therex for  strengthening, 2 x 10 each: seated marches, standing marches with RW, LAQ, standing hip abduction with RW and calf raises. Pt worked on dynamic standing balance this session without UE support standing on airex performing card matching task x 2 trials, standing on red wedge while tossing horseshoes x 2 trials. Pt transported back to room and ambulated withing room with RW and CGA to use bathroom, pt continent of bowel and bladder. Therapist providing CGA for standing balance while pt performed peri care and clothing management. Pt ambulated to sink to wash hands, left seated in recliner at end of session with chair alarm set and needs in reach.   Therapy Documentation Precautions:  Precautions Precautions: Fall Restrictions Weight Bearing Restrictions: Yes RUE Weight Bearing: Weight bearing as tolerated RLE Weight Bearing: Weight bearing as tolerated    Therapy/Group: Individual Therapy  Netta Corrigan, PT, DPT 06/20/2018, 7:45 AM

## 2018-06-21 ENCOUNTER — Inpatient Hospital Stay (HOSPITAL_COMMUNITY): Payer: Medicare Other | Admitting: Occupational Therapy

## 2018-06-21 ENCOUNTER — Inpatient Hospital Stay (HOSPITAL_COMMUNITY): Payer: Medicare Other

## 2018-06-21 ENCOUNTER — Inpatient Hospital Stay (HOSPITAL_COMMUNITY): Payer: Medicare Other | Admitting: Physical Therapy

## 2018-06-21 LAB — CBC
HCT: 32.4 % — ABNORMAL LOW (ref 36.0–46.0)
Hemoglobin: 10.5 g/dL — ABNORMAL LOW (ref 12.0–15.0)
MCH: 31.5 pg (ref 26.0–34.0)
MCHC: 32.4 g/dL (ref 30.0–36.0)
MCV: 97.3 fL (ref 80.0–100.0)
Platelets: 276 10*3/uL (ref 150–400)
RBC: 3.33 MIL/uL — ABNORMAL LOW (ref 3.87–5.11)
RDW: 12.9 % (ref 11.5–15.5)
WBC: 8.4 10*3/uL (ref 4.0–10.5)
nRBC: 0 % (ref 0.0–0.2)

## 2018-06-21 LAB — BASIC METABOLIC PANEL
Anion gap: 9 (ref 5–15)
BUN: 13 mg/dL (ref 8–23)
CHLORIDE: 95 mmol/L — AB (ref 98–111)
CO2: 31 mmol/L (ref 22–32)
Calcium: 9.6 mg/dL (ref 8.9–10.3)
Creatinine, Ser: 0.82 mg/dL (ref 0.44–1.00)
GFR calc Af Amer: >60 mL/min (ref >60)
GFR calc non Af Amer: >60 mL/min (ref >60)
Glucose, Bld: 115 mg/dL — ABNORMAL HIGH (ref 70–99)
Potassium: 3.8 mmol/L (ref 3.5–5.1)
Sodium: 135 mmol/L (ref 135–145)

## 2018-06-21 MED ORDER — HYDRALAZINE HCL 10 MG PO TABS
10.0000 mg | ORAL_TABLET | Freq: Three times a day (TID) | ORAL | Status: DC
  Administered 2018-06-21 – 2018-06-25 (×11): 10 mg via ORAL
  Filled 2018-06-21 (×12): qty 1

## 2018-06-21 NOTE — Progress Notes (Signed)
Occupational Therapy Session Note  Patient Details  Name: Jessica Bruce MRN: [id number] Date of Birth: 12/29/1922  Today's Date: 06/21/2018 OT Individual Time: 1100-1200 OT Individual Time Calculation (min): 60 min    Short Term Goals: Week 1:  OT Short Term Goal 1 (Week 1): n/a d/t ELOS  Skilled Therapeutic Interventions/Progress Updates:    Pt seen for OT session focusing on IADLs and functional activity tolerance and activities. Pt asleep in w/c upon arrival, easily awoken and agreeable to tx session. She denied pain at rest, however, complaints of soreness as tx session progressed, RN made aware of need for pain meds at end of session. Throughout session, pt completed functional ambulation and transfers with supervision using RW. Required VCs for hand placement during sit>stands with poor carryover btwn trials.  In ADL apartment, completed basic kitchen mobility tasks, practicing obtaining and transporting items. Extensive education provided regarding modified methods in kitchen for increased ease and safety with meal prep activities and while using RW. Seated rest break on low soft surface couch, able to stand with supervision. She then made up standard bed, ambulating around with RW and supervision with min cuing for safety/ RW management. In Therapy gym, completed dynamic standing activity, standing on non-compliant foam mat while folding towels, standing without UE support and close supervision to complete task, tolerating ~5 minutes in standing before seated rest break. Pt ambulated back to room at end of session, left seated in w/c with chair alarm on and set-up with lunch tray and all needs in reach.   Therapy Documentation Precautions:  Precautions Precautions: Fall Restrictions Weight Bearing Restrictions: Yes RUE Weight Bearing: Weight bearing as tolerated RLE Weight Bearing: Weight bearing as tolerated    Therapy/Group: Individual Therapy  [name] L 06/21/2018,  7:11 AM

## 2018-06-21 NOTE — Progress Notes (Signed)
Physical Therapy Session Note  Patient Details  Name: Jessica Bruce MRN: [id number] Date of Birth: 22-May-1923  Today's Date: 06/21/2018 PT Individual Time: 1515-1550 PT Individual Time Calculation (min): 35 min   Short Term Goals: Week 1:  PT Short Term Goal 1 (Week 1): Pt will perform transfers with supervision bed<>chair PT Short Term Goal 2 (Week 1): Pt will ambulate x 150 ft with RW and supervision  PT Short Term Goal 3 (Week 1): Pt will initiate stair training  Skilled Therapeutic Interventions/Progress Updates: Pt presented in w/c with dgt present agreeable to therapy. Pt requesting to use bathroom prior to leaving room. Pt ambulated to toilet and performed toilet transfers with CGA. Pt was able to stand and complete peri-care with close supervision. Pt then ambulated to sink for hand hygiene and was able to wash both hands and reach for paper towels without UE support or increased sway. Pt ambulated to rehab gym CGA and chair follow. Pt noted to have decreased cadence and slightly antalgic gait. After ambulation pt indicating pain 5/10 however per pt, no significant increase. Pt transported remaining distance to day room and performed ambulatory transfer to NuStep. Pt performed NuStep activity L1 x 6 min for RLE ROM and global strengthening. Pt returned to w/c in same manner as prior and transported back to room. Pt requesting to return to bed and performed ambulatory transfer to bed. Pt required minA for RLE placement onto bed after x 2 attempts however was able to reposition self to comfort without assist. Pt left in bed with alarm, on, call bell within reach and needs met.      Therapy Documentation Precautions:  Precautions Precautions: Fall Restrictions Weight Bearing Restrictions: Yes RUE Weight Bearing: Weight bearing as tolerated RLE Weight Bearing: Weight bearing as tolerated  Therapy/Group: Individual Therapy  Rickia [place]  Janya Eveland, PTA  06/21/2018, 4:12  PM

## 2018-06-21 NOTE — Progress Notes (Signed)
Physical Therapy Session Note  Patient Details  Name: [name] MRN: [id number] Date of Birth: 03-Aug-1922  Today's Date: 06/21/2018 PT Individual Time: 1000-1054 PT Individual Time Calculation (min): 54 min   Short Term Goals: Week 1:  PT Short Term Goal 1 (Week 1): Pt will perform transfers with supervision bed<>chair PT Short Term Goal 2 (Week 1): Pt will ambulate x 150 ft with RW and supervision  PT Short Term Goal 3 (Week 1): Pt will initiate stair training  Skilled Therapeutic Interventions/Progress Updates:    Pt seated in recliner upon PT arrival, agreeable to therapy tx and denies pain at rest, increased pain with activity. Pt transferred from sit>stand with RW and CGA, ambulated x 100 ft with RW and supervision, transported the rest of the way. Pt performed standing LE therex for strengthening this session at parallel bars, 2 x 10 each including: hip abduction, calf raises, hip extension, hamstring curls and mini squats. Pt ambulated from gym<>rehab apartment this session 2 x 80 ft with RW and supervision, performed bed transfer with CGA, bed mobility with min assist, couch transfer with CGA and recliner transfer with CGA. PT performed 5x STS this session 11.5 seconds with arms crossed over chest from the mat. Pt participated in berg balance test this session as detailed below, scored 31/56 and discussed results with the pt. Pt transported back to room and left seated in w/c with needs in reach and chair alarm set.   Therapy Documentation Precautions:  Precautions Precautions: Fall Restrictions Weight Bearing Restrictions: Yes RUE Weight Bearing: Weight bearing as tolerated RLE Weight Bearing: Weight bearing as tolerated  Balance Standardized Balance Assessment Standardized Balance Assessment: Berg Balance Test Berg Balance Test Sit to Stand: Able to stand without using hands and stabilize independently Standing Unsupported: Able to stand 2 minutes with  supervision Sitting with Back Unsupported but Feet Supported on Floor or Stool: Able to sit safely and securely 2 minutes Stand to Sit: Controls descent by using hands Transfers: Able to transfer safely, definite need of hands Standing Unsupported with Eyes Closed: Able to stand 10 seconds with supervision Standing Ubsupported with Feet Together: Able to place feet together independently but unable to hold for 30 seconds From Standing, Reach Forward with Outstretched Arm: Can reach forward >12 cm safely (5") From Standing Position, Pick up Object from Floor: Unable to try/needs assist to keep balance(able to pick up shoe, min assist for balane) From Standing Position, Turn to Look Behind Over each Shoulder: Turn sideways only but maintains balance Turn 360 Degrees: Needs assistance while turning Standing Unsupported, Alternately Place Feet on Step/Stool: Able to complete >2 steps/needs minimal assist Standing Unsupported, One Foot in Front: Able to take small step independently and hold 30 seconds Standing on One Leg: Tries to lift leg/unable to hold 3 seconds but remains standing independently Total Score: 31   Therapy/Group: Individual Therapy  Netta Corrigan , PT, DPT 06/21/2018, 7:56 AM

## 2018-06-21 NOTE — Plan of Care (Signed)
  Problem: RH PAIN MANAGEMENT Goal: RH STG PAIN MANAGED AT OR BELOW PT'S PAIN GOAL Outcome: Progressing  Assess pain level every shift and as needed. Administered prn pain regimen as ordered and needed

## 2018-06-21 NOTE — Progress Notes (Signed)
Occupational Therapy Session Note  Patient Details  Name: Jessica Bruce MRN: [id number] Date of Birth: 01-18-1923  Today's Date: 06/21/2018 OT Individual Time: 9798-9211 OT Individual Time Calculation (min): 42 min    Short Term Goals: Week 1:  OT Short Term Goal 1 (Week 1): n/a d/t ELOS  Skilled Therapeutic Interventions/Progress Updates:    Upon entering the room, pt supine in bed with no c/o pain this session and agreeable to OT intervention. Pt requesting to wash and dress at sink this session. Pt performed supine >sit with supervision. Pt ambulating with min guard to sink. Pt bathing self with sit <>stand at sink with min guard as needed for standing balance. Pt utilized LH reacher to thread LB clothing onto R LE this session with min verbal cuing for technique. Pt ambulating back to sit in recliner chair at end of session with min guard and use of RW. Pt seated with all needs within reach and MD arriving to assess.   Therapy Documentation Precautions:  Precautions Precautions: Fall Restrictions Weight Bearing Restrictions: Yes RUE Weight Bearing: Weight bearing as tolerated RLE Weight Bearing: Weight bearing as tolerated General:   Vital Signs: Therapy Vitals Pulse Rate: 72 Resp: 18 BP: (!) 157/63 Patient Position (if appropriate): Lying Oxygen Therapy SpO2: 97 % Pain:   ADL: ADL Grooming: Supervision/safety Where Assessed-Grooming: Standing at sink Upper Body Bathing: Minimal cueing Where Assessed-Upper Body Bathing: Shower Lower Body Bathing: Minimal cueing Where Assessed-Lower Body Bathing: Shower Upper Body Dressing: Moderate cueing Where Assessed-Upper Body Dressing: Chair Lower Body Dressing: Maximal assistance Where Assessed-Lower Body Dressing: Chair Toileting: Maximal cueing Where Assessed-Toileting: Glass blower/designer: Minimal assistance, Minimal verbal cueing Toilet Transfer Method: Counselling psychologist: Raised toilet  seat Social research officer, government: Curator Method: Heritage manager: Shower seat with back   Therapy/Group: Individual Therapy  Gypsy Decant 06/21/2018, 9:32 AM

## 2018-06-21 NOTE — Progress Notes (Signed)
[place] PHYSICAL MEDICINE & REHABILITATION PROGRESS NOTE  Subjective/Complaints: Patient seen sitting up in her chair this morning.  She states she slept well overnight.  States had a good weekend.  She is more positive about her progress this morning.  ROS: Denies CP, shortness of breath, nausea, vomiting, diarrhea.  Objective: Vital Signs: Blood pressure (!) 157/63, pulse 72, temperature 97.8 F (36.6 C), temperature source Oral, resp. rate 18, height 5' (1.524 m), weight 63.9 kg, SpO2 97 %. No results found. Recent Labs    06/21/18 0626  WBC 8.4  HGB 10.5*  HCT 32.4*  PLT 276   Recent Labs    06/21/18 0626  NA 135  K 3.8  CL 95*  CO2 31  GLUCOSE 115*  BUN 13  CREATININE 0.82  CALCIUM 9.6    Physical Exam: BP (!) 157/63 (BP Location: Right Arm)   Pulse 72   Temp 97.8 F (36.6 C) (Oral)   Resp 18   Ht 5' (1.524 m)   Wt 63.9 kg   SpO2 97%   BMI 27.51 kg/m  Constitutional: No distress . Vital signs reviewed. HENT: Normocephalic.  Atraumatic. Eyes: EOMI. No discharge. Cardiovascular: RRR.  No JVD. Respiratory: CTA bilaterally.  Normal effort. GI: BS +. Non-distended. Musc: No edema or tenderness in extremities. ZS:[id number].  Bowel sounds normal. Musculoskeletal: She exhibitsedema and tenderness right hip and shoulder. Neurological: She isalertand oriented  Motor: RUE: 4/5 proximal to distal.  LUE 5/5.  LLE 4+/5 prox to distal.  RLE: 4+/5 HF, KE and 4+/5 ADF/PF Skin: Resolving ecchymosis right clavicle.  Right hip with staples C/D/I Psychiatric: She has anormal mood and affect. Herbehavior is normal.Judgmentand thought contentnormal.   Assessment/Plan: 1. Functional deficits secondary to right femoral neck and right clavicle fracture status post right THA which require 3+ hours per day of interdisciplinary therapy in a comprehensive inpatient rehab setting.  Physiatrist is providing close team supervision and 24 hour management of  active medical problems listed below.  Physiatrist and rehab team continue to assess barriers to discharge/monitor patient progress toward functional and medical goals  Care Tool:  Bathing    Body parts bathed by patient: Right arm, Chest, Abdomen, Front perineal area, Buttocks, Right upper leg, Face, Left arm, Left upper leg, Right lower leg, Left lower leg   Body parts bathed by helper: Left arm, Right lower leg, Left lower leg     Bathing assist Assist Level: Contact Guard/Touching assist     Upper Body Dressing/Undressing Upper body dressing   What is the patient wearing?: Pull over shirt    Upper body assist Assist Level: Set up assist    Lower Body Dressing/Undressing Lower body dressing      What is the patient wearing?: Underwear/pull up, Pants     Lower body assist Assist for lower body dressing: Minimal Assistance - Patient > 75%     Toileting Toileting    Toileting assist Assist for toileting: Contact Guard/Touching assist     Transfers Chair/bed transfer  Transfers assist     Chair/bed transfer assist level: Minimal Assistance - Patient > 75%     Locomotion Ambulation   Ambulation assist      Assist level: Contact Guard/Touching assist Assistive device: Walker-rolling Max distance: 200 ft   Walk 10 feet activity   Assist     Assist level: Contact Guard/Touching assist Assistive device: Walker-rolling   Walk 50 feet activity   Assist    Assist level: Contact Guard/Touching assist Assistive device:  Walker-rolling    Walk 150 feet activity   Assist Walk 150 feet activity did not occur: Safety/medical concerns  Assist level: Contact Guard/Touching assist Assistive device: Walker-rolling    Walk 10 feet on uneven surface  activity   Assist Walk 10 feet on uneven surfaces activity did not occur: Safety/medical concerns         Wheelchair     Assist Will patient use wheelchair at discharge?: No              Wheelchair 50 feet with 2 turns activity    Assist            Wheelchair 150 feet activity     Assist            Medical Problem List and Plan: 1.Functional and mobility deficitssecondary to right femoral neck and right clavicle fractures. S/p right THA due to fall 06/11/18  Continue CIR 2. DVT Prophylaxis/Anticoagulation: Pharmaceutical:Lovenoxadded--change back to ASA at discharge.R thigh swelling is primarily pitting edema, will monitor  3. Pain Management:controlled with tramadol qid. Continue tylenol,ice and robaxin prn.  4. Mood:Patient motivated to get better. LCSW to follow for evaluation and support. 5. Neuropsych: This patientiscapable of making decisions on herown behalf. 6. Skin/Wound Care:Monitor wound for healing. Continue nutritional supplements 7. Fluids/Electrolytes/Nutrition:Monitor I/O. Added Ensure supplements as intake poor. 8. YEB:XIDHWYS BP bid--on Coreg, hydralazine, amlodipine. Now on lower dose coreg due to bradycardia. Continue to monitor HR bid.  HCTZ on hold  Vitals:   06/21/18 0437 06/21/18 0843  BP: (!) 148/54 (!) 157/63  Pulse: 63 72  Resp: 16 18  Temp: 97.8 F (36.6 C)   SpO2: 98% 97%   Hydralazine increased to 3 times daily on 12/9 9. Anxiety disorder:Mood stable and managed with lexapro. 10. Insomnia: Resumed melatonin.  11. ABLA:  Hemoglobin 10.5 on 12/9  Continue to monitor 12. Hyponateremia:Discontinued HCTZ.   Sodium 135 on 12/9  Continue to monitor  13.Constipation: Reported nausea with poor p.o. Intake  Started MiraLAX daily.   Improving 14.  Hypoalbuminemia  Supplement initiated on 12/4  LOS: 6 days A FACE TO FACE EVALUATION WAS PERFORMED  Jessica Bruce Jessica Bruce 06/21/2018, 10:20 AM

## 2018-06-22 ENCOUNTER — Inpatient Hospital Stay (HOSPITAL_COMMUNITY): Payer: Medicare Other | Admitting: Occupational Therapy

## 2018-06-22 ENCOUNTER — Inpatient Hospital Stay (HOSPITAL_COMMUNITY): Payer: Medicare Other

## 2018-06-22 ENCOUNTER — Inpatient Hospital Stay (HOSPITAL_COMMUNITY): Payer: Medicare Other | Admitting: Physical Therapy

## 2018-06-22 NOTE — Plan of Care (Signed)
  Problem: RH PAIN MANAGEMENT Goal: RH STG PAIN MANAGED AT OR BELOW PT'S PAIN GOAL Outcome: Progressing  Assess pain every shift and as needed, administered scheduled and prn pain regimen

## 2018-06-22 NOTE — Progress Notes (Signed)
Physical Therapy Session Note  Patient Details  Name: Jessica Bruce MRN: [id number] Date of Birth: 10-11-1922  Today's Date: 06/22/2018 PT Individual Time: 0800-0900 PT Individual Time Calculation (min): 60 min   Short Term Goals: Week 1:  PT Short Term Goal 1 (Week 1): Pt will perform transfers with supervision bed<>chair PT Short Term Goal 2 (Week 1): Pt will ambulate x 150 ft with RW and supervision  PT Short Term Goal 3 (Week 1): Pt will initiate stair training  Skilled Therapeutic Interventions/Progress Updates:    patient received in bathroom on toilet, completing toileting and able to perform self-care with S today. Ambulated in her room with S and RW, required MaxA to thread pants over feet but then able to stand and pull pants/underwear up with S and RW, able to don shirt with S and VC. Gait trained multiple distances of 113f today with RW and S, limited by fatigue and soreness in R hip. Worked on balance exercises in front of [employer] with no UE support, cross midline reaching, and min guard for balance and safety. Patient very pleasant throughout session. She was [facility] completed second bout of toileting with S and was left up in the chair with chair alarm active and all needs otherwise met.   Therapy Documentation Precautions:  Precautions Precautions: Fall Restrictions Weight Bearing Restrictions: Yes RUE Weight Bearing: Weight bearing as tolerated RLE Weight Bearing: Weight bearing as tolerated General:   Vital Signs:  Pain: Pain Assessment Pain Scale: 0-10 Pain Score: 3  Faces Pain Scale: Hurts a little bit Pain Type: Acute pain Pain Location: Hip Pain Orientation: Right Pain Descriptors / Indicators: Aching;Sore Pain Onset: On-going Patients Stated Pain Goal: 0 Pain Intervention(s): Ambulation/increased activity;Repositioned Multiple Pain Sites: No    Therapy/Group: Individual Therapy  KDeniece ReePT, DPT,  CBIS  Supplemental Physical Therapist [facility] [place]   Pager [phone] Rehab Office [phone]  06/22/2018, 12:36 PM

## 2018-06-22 NOTE — Progress Notes (Signed)
Occupational Therapy Session Note  Patient Details  Name: Jessica Bruce MRN: [id number] Date of Birth: [birth year]  Today's Date: 06/22/2018 OT Individual Time: 1045-1200 OT Individual Time Calculation (min): 75 min    Short Term Goals: Week 1:  OT Short Term Goal 1 (Week 1): n/a d/t ELOS  Skilled Therapeutic Interventions/Progress Updates:    Pt seen for OT ADL bathing/dressing session. Pt sitting up in recliner upon arrival, voiced complaints of "generalized soreness" in R hip, reports having already received pain meds.  Throughout session, she completed functional ambulation using RW with supervision, occasional VCs for RW management in functional context and hand placement during sit<>stand. She bathed seated on shower chair with supervision/VCs and standing with use of grab bar to complete pericare/buttock hygiene. She dressed seated on BSC over toilet with increased time and VCs for problem solving and independence with donning bra. Problem solved method for pt to independently don socks. When sitting in low w/c without w/c cushion, pt able to reach to floor to thread socks and don/ fasten B shoes. Pt reports she has seating option like that at home where she plans to dress. She completed grooming tasks standing at sink. Able to stand without UE support while completing tasks with distant supervision. Pt returned to recliner at end of session, left set-up with meal tray, all needs in reach and chair alarm on  Therapy Documentation Precautions:  Precautions Precautions: Fall Restrictions Weight Bearing Restrictions: Yes RUE Weight Bearing: Weight bearing as tolerated RLE Weight Bearing: Weight bearing as tolerated   Therapy/Group: Individual Therapy  Shaneca Orne L 06/22/2018, 7:03 AM

## 2018-06-22 NOTE — Progress Notes (Signed)
Occupational Therapy Session Note  Patient Details  Name: Jessica Bruce MRN: [id number] Date of Birth: 31-Jan-1923  Today's Date: 06/22/2018 OT Individual Time: 0930-1015 Session 2: 3790-2409 OT Individual Time Calculation (min): 45 min Session 2: 44 min   Short Term Goals: Week 1:  OT Short Term Goal 1 (Week 1): n/a d/t ELOS  Skilled Therapeutic Interventions/Progress Updates:    Session focused on functional mobility and B UE strengthening/endurance. Initially, emotional support and therapeutic use of self provided to comfort pt in anxiety re d/c. Pt completed 125 ft of functional mobility with 1 seated rest break. Pt c/o soreness in R hip at beginning of session and RN administered pain medication. Pt stood on non compliant foam mat and completed B UE strengthening circuit with 1.5 lb weights. Cueing and activity grading provided throughout for technique and muscle activation. Pt demonstrated good safety awareness and use of RW. Pt was then guided through several yoga poses aimed at increasing strength, flexibility, and posture. Tactile cues provided for stabilization and correct muscle activation. Pt was returned to her room and left sitting up in the recliner with all needs met and chair alarm set.   Session 2: Session focused on functional mobility and dynamic standing balance. Pt completed 125 ft of functional mobility with RW with (S) requiring 2 seated rest breaks. Pt played Wii bowling game with unilateral support on RW with intermittent CGA for dynamic balance. Pt returned to room and laid supine, requiring min A for bed mobility. Pt was left supine with all needs met.   Therapy Documentation Precautions:  Precautions Precautions: Fall Restrictions Weight Bearing Restrictions: Yes RUE Weight Bearing: Weight bearing as tolerated RLE Weight Bearing: Weight bearing as tolerated    Vital Signs: Therapy Vitals Pulse Rate: 78 Resp: 20 BP: (!) 141/51 Patient Position (if  appropriate): Sitting Oxygen Therapy SpO2: 97 % O2 Device: Room Air Pain: Pain Assessment Pain Scale: Faces Faces Pain Scale: Hurts a little bit Pain Type: Surgical pain Pain Location: Hip Pain Orientation: Right Pain Descriptors / Indicators: Sore Pain Onset: On-going Pain Intervention(s): RN made aware   Therapy/Group: Individual Therapy  Curtis Sites 06/22/2018, 9:58 AM

## 2018-06-23 ENCOUNTER — Inpatient Hospital Stay (HOSPITAL_COMMUNITY): Payer: Medicare Other | Admitting: Physical Therapy

## 2018-06-23 ENCOUNTER — Inpatient Hospital Stay (HOSPITAL_COMMUNITY): Payer: Medicare Other | Admitting: Occupational Therapy

## 2018-06-23 MED ORDER — TRAMADOL HCL 50 MG PO TABS
50.0000 mg | ORAL_TABLET | Freq: Four times a day (QID) | ORAL | Status: DC | PRN
  Filled 2018-06-23: qty 1

## 2018-06-23 MED ORDER — ACETAMINOPHEN 325 MG PO TABS
650.0000 mg | ORAL_TABLET | Freq: Three times a day (TID) | ORAL | Status: DC
  Administered 2018-06-23 – 2018-06-25 (×5): 650 mg via ORAL
  Filled 2018-06-23 (×5): qty 2

## 2018-06-23 NOTE — Progress Notes (Signed)
Social Work Patient ID: Jessica Bruce, female   DOB: 09-07-1922, 84 y.o.   MRN: [id number] Met with pt and spoken with daughter via telephone to discuss team conference progression toward her goals and the discharge still 12/13. Daughter is meeting with Tina-hired assist for pt tomorrow at 1:30 pm here and I will be present. Discussed equipment needs and have arranged follow up therapies via [facility]. Work toward discharge Friday.

## 2018-06-23 NOTE — Progress Notes (Signed)
[place] PHYSICAL MEDICINE & REHABILITATION PROGRESS NOTE  Subjective/Complaints:  No issues overnite  ROS: Denies CP, shortness of breath, nausea, vomiting, diarrhea.  Objective: Vital Signs: Blood pressure (!) 153/54, pulse 72, temperature 97.8 F (36.6 C), temperature source Oral, resp. rate 16, height 5' (1.524 m), weight 62.7 kg, SpO2 96 %. No results found. Recent Labs    06/21/18 0626  WBC 8.4  HGB 10.5*  HCT 32.4*  PLT 276   Recent Labs    06/21/18 0626  NA 135  K 3.8  CL 95*  CO2 31  GLUCOSE 115*  BUN 13  CREATININE 0.82  CALCIUM 9.6    Physical Exam: BP (!) 153/54 (BP Location: Left Arm)   Pulse 72   Temp 97.8 F (36.6 C) (Oral)   Resp 16   Ht 5' (1.524 m)   Wt 62.7 kg   SpO2 96%   BMI 27.00 kg/m  Constitutional: No distress . Vital signs reviewed. HENT: Normocephalic.  Atraumatic. Eyes: EOMI. No discharge. Cardiovascular: RRR.  No JVD. Respiratory: CTA bilaterally.  Normal effort. GI: BS +. Non-distended. Musc: No edema or tenderness in extremities. KV:[id number].  Bowel sounds normal. Musculoskeletal: She exhibitsedema and tenderness right hip and shoulder. Neurological: She isalertand oriented  Motor: RUE: 4/5 proximal to distal.  LUE 5/5.  LLE 4+/5 prox to distal.  RLE: 4+/5 HF, KE and 4+/5 ADF/PF Skin: Resolving ecchymosis right clavicle.  Right hip with staples C/D/I Psychiatric: She has anormal mood and affect. Herbehavior is normal.Judgmentand thought contentnormal.   Assessment/Plan: 1. Functional deficits secondary to right femoral neck and right clavicle fracture status post right THA which require 3+ hours per day of interdisciplinary therapy in a comprehensive inpatient rehab setting.  Physiatrist is providing close team supervision and 24 hour management of active medical problems listed below.  Physiatrist and rehab team continue to assess barriers to discharge/monitor patient progress toward functional and  medical goals  Care Tool:  Bathing    Body parts bathed by patient: Right arm, Chest, Abdomen, Front perineal area, Buttocks, Right upper leg, Face, Left arm, Left upper leg, Right lower leg, Left lower leg   Body parts bathed by helper: Left arm, Right lower leg, Left lower leg     Bathing assist Assist Level: Supervision/Verbal cueing     Upper Body Dressing/Undressing Upper body dressing   What is the patient wearing?: Pull over shirt, Bra    Upper body assist Assist Level: Minimal Assistance - Patient > 75%    Lower Body Dressing/Undressing Lower body dressing      What is the patient wearing?: Underwear/pull up, Pants     Lower body assist Assist for lower body dressing: Supervision/Verbal cueing     Toileting Toileting    Toileting assist Assist for toileting: Contact Guard/Touching assist     Transfers Chair/bed transfer  Transfers assist     Chair/bed transfer assist level: Contact Guard/Touching assist     Locomotion Ambulation   Ambulation assist      Assist level: Supervision/Verbal cueing Assistive device: Walker-rolling Max distance: 150   Walk 10 feet activity   Assist     Assist level: Supervision/Verbal cueing Assistive device: Walker-rolling   Walk 50 feet activity   Assist    Assist level: Supervision/Verbal cueing Assistive device: Walker-rolling    Walk 150 feet activity   Assist Walk 150 feet activity did not occur: Safety/medical concerns  Assist level: Supervision/Verbal cueing Assistive device: Walker-rolling    Walk 10 feet on uneven  surface  activity   Assist Walk 10 feet on uneven surfaces activity did not occur: Safety/medical concerns         Wheelchair     Assist Will patient use wheelchair at discharge?: No             Wheelchair 50 feet with 2 turns activity    Assist            Wheelchair 150 feet activity     Assist            Medical Problem List and  Plan: 1.Functional and mobility deficitssecondary to right femoral neck and right clavicle fractures. S/p right THA due to fall 06/11/18  Continue CIR PT, OT - Team conference today please see physician documentation under team conference tab, met with team face-to-face to discuss problems,progress, and goals. Formulized individual treatment plan based on medical history, underlying problem and comorbidities. 2. DVT Prophylaxis/Anticoagulation: Pharmaceutical:Lovenoxadded--change back to ASA at discharge.R thigh swelling is primarily pitting edema, will monitor  3. Pain Management:controlled with tramadol qid. Continue tylenol,ice and robaxin prn.  4. Mood:Patient motivated to get better. LCSW to follow for evaluation and support. 5. Neuropsych: This patientiscapable of making decisions on herown behalf. 6. Skin/Wound Care:Monitor wound for healing. Continue nutritional supplements 7. Fluids/Electrolytes/Nutrition:Monitor I/O. Added Ensure supplements as intake poor. 8. TML:YYTKPTW BP bid--on Coreg, hydralazine, amlodipine. Now on lower dose coreg due to bradycardia. Continue to monitor HR bid.  HCTZ on hold  Vitals:   06/22/18 1931 06/23/18 0611  BP: (!) 139/50 (!) 153/54  Pulse: 70 72  Resp: 16 16  Temp: 98.2 F (36.8 C) 97.8 F (36.6 C)  SpO2: 97% 96%   Hydralazine increased to 3 times daily on 12/9-monitor response 9. Anxiety disorder:Mood stable and managed with lexapro. 10. Insomnia: Resumed melatonin.  11. ABLA:  Hemoglobin 10.5 on 12/9  Continue to monitor 12. Hyponateremia:Discontinued HCTZ.   Sodium 135 on 12/9  Continue to monitor  13.Constipation: Reported nausea with poor p.o. Intake  Started MiraLAX daily.   Improving 14.  Hypoalbuminemia  Supplement initiated on 12/4  LOS: 8 days A FACE TO [place] E Kirsteins 06/23/2018, 7:33 AM

## 2018-06-23 NOTE — Evaluation (Signed)
Recreational Therapy Assessment and Plan  Patient Details  Name: Jessica Bruce MRN: [id number] Date of Birth: 1923-05-04 Today's Date: 06/23/2018  Rehab Potential: Good ELOS: discharge 12/13   Assessment Problem List:      Patient Active Problem List   Diagnosis Date Noted  . Hypoalbuminemia due to protein-calorie malnutrition ([name])   . Hip fracture requiring operative repair ([name]) 06/15/2018  . Closed fracture of neck of right femur ([name])   . Drug induced constipation   . Acute blood loss anemia   . Benign essential HTN   . Anxiety   . Hyponatremia   . Closed right hip fracture, initial encounter ([name]) 06/11/2018  . Right clavicle fracture 06/11/2018  . Hypertension   . Hyperglycemia     Past Medical History:      Past Medical History:  Diagnosis Date  . Anxiety   . Closed right hip fracture, initial encounter ([name]) 06/11/2018  . Hyperglycemia   . Hypertension   . Osteoporosis    Past Surgical History:       Past Surgical History:  Procedure Laterality Date  . ANTERIOR APPROACH HEMI HIP ARTHROPLASTY Right 06/11/2018   Procedure: Right hip hemiarthroplasty direct anterior;  Surgeon: Mcarthur Rossetti, MD;  Location: [place];  Service: Orthopedics;  Laterality: Right;  . CHOLECYSTECTOMY    . COLON SURGERY     pre-cancerous polyp very remotely    Assessment & Plan Clinical Impression: Patient is a 84 y.o. year old female with history ofHTN, anxiety disorder,chronic dizziness; who fell down to steps while visiting family evening prior to admission on 06/11/2018 with injury to right shoulder as well as right hip and difficulty weightbearing the right lower extremity. She was found to have right femoral neck fracture and a nondisplaced distal right clavicle fracture. Right clavicle fracture to be treated conservatively per Ortho. She was taken to the OR for right anterior hip arthroplasty by Dr. Ninfa Linden and postop to be weightbearing  as tolerated with aspirin for DVT prophylaxis. She has had issues with constipation, AKI, hyponatremia as well as acute blood loss anemia. Therapy evaluations done revealing deficits in mobility and ADLs. CIR recommended for follow-up therapy.  Patient transferred to CIR on 06/15/2018.   Pt referred by team for participation in community reintegration.  Met with pt today to discuss the purpose and potential goals.  Pt agreeable to participate in an outing to be scheduled tomorrow.  Pt presents with decreased activity tolerance, decreased functional mobility, decreased balance Limiting pt's independence with leisure/community pursuits.   Psychosocial / Spiritual Social interaction - Mood/Behavior: Cooperative Academic librarian Appropriate for Education?: Yes Patient Agreeable to Outing?: Yes Strengths/Weaknesses Patient Strengths/Abilities: Willingness to participate;Active premorbidly Patient weaknesses: Physical limitations TR Patient demonstrates impairments in the following area(s): Endurance;Motor;Pain;Safety  Plan Rec Therapy Plan Is patient appropriate for Therapeutic Recreation?: Yes Rehab Potential: Good Treatment times per week: Min 1 time >60 minutes for community reintegration during LOS Estimated Length of Stay: discharge 12/13 TR Treatment/Interventions: Adaptive equipment instruction;Community reintegration;Group participation (Comment);Patient/family education;Functional mobility training  Recommendations for other services: None   Discharge Criteria: Patient will be discharged from TR if patient refuses treatment 3 consecutive times without medical reason.  If treatment goals not met, if there is a change in medical status, if patient makes no progress towards goals or if patient is discharged from hospital.  The above assessment, treatment plan, treatment alternatives and goals were discussed and mutually agreed upon: by patient  [place] 06/23/2018, 4:12  PM

## 2018-06-23 NOTE — Progress Notes (Signed)
Physical Therapy Session Note  Patient Details  Name: Jessica Bruce MRN: [id number] Date of Birth: May 17, 1923  Today's Date: 06/23/2018 PT Individual Time: 0800-0900 PT Individual Time Calculation (min): 60 min   Short Term Goals: Week 1:  PT Short Term Goal 1 (Week 1): Pt will perform transfers with supervision bed<>chair PT Short Term Goal 2 (Week 1): Pt will ambulate x 150 ft with RW and supervision  PT Short Term Goal 3 (Week 1): Pt will initiate stair training  Skilled Therapeutic Interventions/Progress Updates: Pt received seated on EOB finishing breakfast, c/o pain as below and agreeable to treatment. Gait in/out of bathroom with RW and S; toileting performed modI. Pt verbalizing desire to shower; discussed schedule with pt and later OT sessions are short 30-45 min and pt stating she "takes a long time" to shower, get dressed, etc. Pt ambulated within room to retrieve necessary items for shower. Undressed with S for balance, min cues for sequencing. Performed bathing with minA for washing/drying back; pt utilized long handled sponge to wash remainder of body. Dressing performed minA for fastening bra and helping pull down back. Standing grooming at sink with modI, increased time. Returned to bathroom to have bowel movement; pt requested therapist perform hygiene however therapist encouraged pt to perform. Pt able to reach around to clean buttocks, however required assist from therapist for cleanliness after several attempts. Gait 2x150' with RW and S, slow speed with antalgic pattern. Remained seated in recliner at end of session, all needs in reach.      Therapy Documentation Precautions:  Precautions Precautions: Fall Restrictions Weight Bearing Restrictions: Yes RUE Weight Bearing: Weight bearing as tolerated RLE Weight Bearing: Weight bearing as tolerated Pain: Pain Assessment Pain Scale: 0-10 Pain Score: 5  Pain Type: Surgical pain Pain Location: Hip Pain Orientation:  Right Pain Descriptors / Indicators: Aching Pain Frequency: Intermittent Pain Onset: On-going Patients Stated Pain Goal: 1 Pain Intervention(s): Medication (See eMAR)    Therapy/Group: Individual Therapy  Corliss Skains 06/23/2018, 9:02 AM

## 2018-06-23 NOTE — Progress Notes (Signed)
Occupational Therapy Session Note  Patient Details  Name: Jessica Bruce MRN: [id number] Date of Birth: [birth year]  Today's Date: 06/23/2018 OT Individual Time: 0930-1000 and 1030-1115 and 1415-1500 OT Individual Time Calculation (min): 30 min and 45 min and 45 min   Short Term Goals: Week 1:  OT Short Term Goal 1 (Week 1): n/a d/t ELOS  Skilled Therapeutic Interventions/Progress Updates:    Session One: Pt seen for OT session focusing on functional ambulation and mobility. Pt sitting up in recliner upon arrival, voiced soreness in R LE, RN made aware.  She ambulated throughout room and unit with supervision using RW. Completed toileting task with distant supervision. Grooming tasks completed standing at sink at mod I level. She ambulated throughout unit with supervision to ADL apartment. Completed sit>stand from recliner with supervision. Discussed with pt and recreational therapist community outing for tomorrow, with education pt agreeable to outing.  Pt ambulated back to room at end of session, left seated in recliner with all needs in reach.   Session Two: Pt seen for OT session focusing on functional ambulation and AD management. Pt asleep in recliner upon arrival, voicing soreness and fatigue but willing to cont with therapy and stated being pre-medicated prior to tx session. She ambulated to therapy gym using RW with supervision. Completed obstacle course in gym, weaving through cones and stepping over objects. Completed with supervision and min cuing for RW management.  Used weighted grocery care as AD to ambulate throughout unit to facilitate less UE reliance on AD, completed with supervision and VCs for upright posture. Pt willing to trial rollator, education provided regarding pro/cons of rollator. Completed ambulation through unit with rollator at supervision level. Pt voiced liking rollator better than RW and demonstrated ability to independently and safely manage brakes. CSW made  of change in equipment order. Pt returned to room at end of session, left seated in recliner with all needs in reach.   Session Three: Pt seen for OT session focusing on functional ambulation and community re-entry. Pt sitting up in recliner upon arrival, complaints of generalized soreness, cont with therapy without need for intervention. She ambulated throughout unit and down to hospital gift store with supervision using rollator, occasional cuing for rollator brake management. Education, demonstration, and return demonstration for seated rest break on rollator. Ambulated throughout hospital gift store with supervision, successfully navigating around obstacles without assist.  Pt returned to room at end of session, positioned in recliner for comfort and all needs in reach.   Therapy Documentation Precautions:  Precautions Precautions: Fall Restrictions Weight Bearing Restrictions: Yes RUE Weight Bearing: Weight bearing as tolerated RLE Weight Bearing: Weight bearing as tolerated Pain: Pain Assessment Pain Scale: 0-10 Pain Score: 5  Pain Type: Surgical pain Pain Location: Hip Pain Orientation: Right Pain Descriptors / Indicators: Aching Pain Frequency: Intermittent Pain Onset: On-going Patients Stated Pain Goal: 1 Pain Intervention(s): Repositioned, increased activity   Therapy/Group: Individual Therapy  Laticia Vannostrand L 06/23/2018, 7:11 AM

## 2018-06-23 NOTE — Patient Care Conference (Signed)
Inpatient RehabilitationTeam Conference and Plan of Care Update Date: 06/23/2018   Time: 10:15 Am    Patient Name: Jessica Bruce      Medical Record Number: [id number]  Date of Birth: May 23, 1923 Sex: Female         Room/Bed: 4M09C/4M09C-01 Payor Info: Payor: MEDICARE / Plan: MEDICARE PART A AND B / Product Type: *No Product type* /    Admitting Diagnosis: Rt Hip fx  Admit Date/Time:  06/15/2018  6:49 PM Admission Comments: No comment available   Primary Diagnosis:  <principal problem not specified> Principal Problem: <principal problem not specified>  Patient Active Problem List   Diagnosis Date Noted  . Postoperative pain   . Hypoalbuminemia due to protein-calorie malnutrition ([place])   . Hip fracture requiring operative repair ([place]) 06/15/2018  . Closed fracture of neck of right femur ([place])   . Drug induced constipation   . Acute blood loss anemia   . Benign essential HTN   . Anxiety   . Hyponatremia   . Closed right hip fracture, initial encounter ([place]) 06/11/2018  . Right clavicle fracture 06/11/2018  . Hypertension   . Hyperglycemia     Expected Discharge Date: Expected Discharge Date: 06/25/18  Team Members Present: Physician leading conference: Dr. Alysia Penna Social Worker Present: Ovidio Kin, LCSW Nurse Present: Rayetta Pigg, RN PT Present: Kennyth Lose, PT OT Present: Amy Rounds, OT SLP Present: Windell Moulding, SLP PPS Coordinator present : Daiva Nakayama, RN, CRRN;Melissa Gertie Fey     Current Status/Progress Goal Weekly Team Focus  Medical   Right femoral neck and right clavicular fracture pain improved  Reduce fall risk, manage hypertension  Discharge planning   Bowel/Bladder   Continent of bowel/bladder; LBM   Remain continent of bowel/bladder with min assist  Assess bowel/bladder needs   Swallow/Nutrition/ Hydration             ADL's   Supervision overall with occasional cuing for sequencing  Supervision-mod I   ADL/IADL re-training,  functional activity tolerance, d/c planning   Mobility   S overall  modI overall  activity tolerance, pain management, LE strength, dynamic balance/gait   Communication             Safety/Cognition/ Behavioral Observations            Pain   Tolerable pain, Tramadol schedule given and was effective  <2  Assess and treat pain q shift and as needed   Skin   Rt. hip surgical incision with dry dsg  main skin integrity with min assist  Assess skin q shift and as needed      *See Care Plan and progress notes for long and short-term goals.     Barriers to Discharge  Current Status/Progress Possible Resolutions Date Resolved   Physician    Medical stability     Progressing towards goal of discharge this week  Discharge planning, caregiver training      Nursing                  PT                    OT                  SLP                SW                Discharge Planning/Teaching Needs:  Home back to her independent  living apartment with some assist from home health and if needed hired assist      Team Discussion:  Progressing toward her mod/i level goals and supervision for tub and meal prep. Going on outing tomorrow. Pain is being managed and medically stable. Preparing on going back to senior living apartment. Daughter to hire assist for a short time for her-for the transition  Revisions to Treatment Plan:  DC 12/13    Continued Need for Acute Rehabilitation Level of Care: The patient requires daily medical management by a physician with specialized training in physical medicine and rehabilitation for the following conditions: Daily direction of a multidisciplinary physical rehabilitation program to ensure safe treatment while eliciting the highest outcome that is of practical value to the patient.: Yes Daily medical management of patient stability for increased activity during participation in an intensive rehabilitation regime.: Yes Daily analysis of laboratory values  and/or radiology reports with any subsequent need for medication adjustment of medical intervention for : Post surgical problems;Blood pressure problems   I attest that I was present, lead the team conference, and concur with the assessment and plan of the team.   [name] 06/24/2018, 9:39 AM

## 2018-06-24 ENCOUNTER — Encounter (HOSPITAL_COMMUNITY): Payer: Medicare Other | Admitting: *Deleted

## 2018-06-24 ENCOUNTER — Inpatient Hospital Stay (HOSPITAL_COMMUNITY): Payer: Medicare Other | Admitting: Occupational Therapy

## 2018-06-24 ENCOUNTER — Inpatient Hospital Stay (HOSPITAL_COMMUNITY): Payer: Medicare Other | Admitting: Physical Therapy

## 2018-06-24 MED ORDER — POLYETHYLENE GLYCOL 3350 17 G PO PACK: 17.0000 g | PACK | Freq: Every day | ORAL | Status: DC | PRN

## 2018-06-24 NOTE — Progress Notes (Signed)
Physical Therapy Discharge Summary  Patient Details  Name: Jessica Bruce MRN: [id number] Date of Birth: Nov 07, 1922  Today's Date: 06/24/2018 PT Individual Time: 1300-1340 PT Individual Time Calculation (min): 40 min    Patient has met 8 of 8 long term goals due to improved activity tolerance, improved balance, improved postural control, increased strength, increased range of motion, decreased pain and ability to compensate for deficits.  Patient to discharge at an ambulatory level Modified Independent.   Patient's care partner is independent to provide the necessary physical assistance at discharge for ADL tasks as described in OT d/c.  Reasons goals not met: All goals met  Recommendation:  Patient will benefit from ongoing skilled PT services in home health setting to continue to advance safe functional mobility, address ongoing impairments in strength, balance, activity tolerance, pain, and minimize fall risk.  Equipment: Rollator  Reasons for discharge: treatment goals met and discharge from hospital  Patient/family agrees with progress made and goals achieved: Yes  PT Discharge Precautions/Restrictions Precautions Precautions: Fall Restrictions Weight Bearing Restrictions: Yes RUE Weight Bearing: Weight bearing as tolerated RLE Weight Bearing: Weight bearing as tolerated Vital Signs Therapy Vitals Temp: 98.2 F (36.8 C) Pulse Rate: 76 Resp: 18 BP: (!) 159/63 Patient Position (if appropriate): Lying Oxygen Therapy SpO2: 95 % O2 Device: Room Air Pain Pain Assessment Pain Scale: 0-10 Pain Score: 5  Pain Type: Acute pain Pain Location: Hip Pain Orientation: Right Pain Descriptors / Indicators: Aching Pain Frequency: Intermittent Pain Onset: On-going Patients Stated Pain Goal: 2 Pain Intervention(s): Medication (See eMAR) Vision/Perception  Perception Perception: Within Functional Limits Praxis Praxis: Intact  Cognition Overall Cognitive Status: Within  Functional Limits for tasks assessed Orientation Level: Oriented X4 Sustained Attention: Appears intact Selective Attention: Appears intact Memory: Appears intact Awareness: Appears intact Problem Solving: Appears intact Safety/Judgment: Appears intact Sensation Sensation Light Touch: Appears Intact Proprioception: Appears Intact Coordination Gross Motor Movements are Fluid and Coordinated: Yes Fine Motor Movements are Fluid and Coordinated: Yes Motor  Motor Motor: Within Functional Limits  Mobility Bed Mobility Bed Mobility: Sit to Supine;Supine to Sit Supine to Sit: Independent Sit to Supine: Independent Transfers Transfers: Sit to [employer] Transfers Sit to Stand: Independent Stand to Sit: Independent Stand Pivot Transfers: Independent with assistive device Transfer (Assistive device): Rollator Locomotion  Gait Ambulation: Yes Gait Assistance: Independent with assistive device Gait Distance (Feet): 175 Feet Assistive device: Rollator Gait Gait: Yes Gait Pattern: Impaired Gait Pattern: Antalgic Gait velocity: 1.67 ft/sec Stairs / Additional Locomotion Stairs: Yes Stairs Assistance: Supervision/Verbal cueing Stair Management Technique: Two rails;Step to pattern;Forwards Number of Stairs: 8 Height of Stairs: 3 Ramp: Supervision/Verbal cueing Curb: Supervision/Verbal cueing Wheelchair Mobility Wheelchair Mobility: No  Trunk/Postural Assessment  Cervical Assessment Cervical Assessment: Exceptions to WFL(Forward head) Thoracic Assessment Thoracic Assessment: Exceptions to WFL(Rounded shoulders; kyphotic) Lumbar Assessment Lumbar Assessment: Exceptions to WFL(Posterior pelvic tilt) Postural Control Postural Control: Within Functional Limits  Balance Balance Balance Assessed: Yes Static Sitting Balance Static Sitting - Balance Support: Feet supported;No upper extremity supported Static Sitting - Level of Assistance: 7: Independent Dynamic Sitting  Balance Dynamic Sitting - Balance Support: During functional activity;Feet supported Dynamic Sitting - Level of Assistance: 6: Modified independent (Device/Increase time);5: Stand by assistance Sitting balance - Comments: Sitting to complete bathing/dressing tasks Static Standing Balance Static Standing - Balance Support: During functional activity;No upper extremity supported Static Standing - Level of Assistance: 6: Modified independent (Device/Increase time);5: Stand by assistance Dynamic Standing Balance Dynamic Standing - Balance Support: During functional activity;No upper  extremity supported Dynamic Standing - Level of Assistance: 6: Modified independent (Device/Increase time);5: Stand by assistance Extremity Assessment  RUE Assessment RUE Assessment: Exceptions to [facility] Active Range of Motion (AROM) Comments: decreased ROM at shoulder d/t fx 0-100* shoulder flextion/abduction  General Strength Comments: Not formally assessed 2/2 pain/ pre-cautions LUE Assessment LUE Assessment: Within Functional Limits RLE Assessment RLE Assessment: Exceptions to [facility] General Strength Comments: 3+/5 hip flexion/extension, 3/5 hip abduction/adduction, 4/5 to 4+/5 knee/ankle LLE Assessment LLE Assessment: Within Functional Limits General Strength Comments: grossly 4+/5 throughout   Skilled Therapeutic Intervention: Pt received in recliner, denies pain and agreeable to treatment. Assessed mobility as above with modI overall with rollator. Reviewed recommendations for use of rollator for gait within room and to/from dining hall. Pt handed off to daughter and ALF staff for conference meeting regarding needs at d/c. Pt and family with no further questions/concerns regarding d/c to ALF at this time.   Benjiman Core Seigle 06/24/2018, 7:52 AM

## 2018-06-24 NOTE — Progress Notes (Signed)
Recreational Therapy Session Note  Patient Details  Name: Jessica Bruce MRN: [id number] Date of Birth: 05/16/1923 Today's Date: 06/24/2018  Pain: no c/o Skilled Therapeutic Interventions/Progress Updates: Pt refused 90 minute scheduled outing due to cold temperatures although discussed and agreeable yesterday.  Goals not met.  SIMPSON,LISA 06/24/2018, 12:39 PM  

## 2018-06-24 NOTE — Plan of Care (Signed)
Pt refused scheduled outing

## 2018-06-24 NOTE — Progress Notes (Signed)
[place] PHYSICAL MEDICINE & REHABILITATION PROGRESS NOTE  Subjective/Complaints:  Slept well , no issues overnite  ROS: Denies CP, shortness of breath, nausea, vomiting, diarrhea.  Objective: Vital Signs: Blood pressure (!) 159/63, pulse 76, temperature 98.2 F (36.8 C), resp. rate 18, height 5' (1.524 m), weight 63.5 kg, SpO2 95 %. No results found. No results for input(s): WBC, HGB, HCT, PLT in the last 72 hours. No results for input(s): NA, K, CL, CO2, GLUCOSE, BUN, CREATININE, CALCIUM in the last 72 hours.  Physical Exam: BP (!) 159/63 (BP Location: Left Arm)   Pulse 76   Temp 98.2 F (36.8 C)   Resp 18   Ht 5' (1.524 m)   Wt 63.5 kg   SpO2 95%   BMI 27.34 kg/m  Constitutional: No distress . Vital signs reviewed. HENT: Normocephalic.  Atraumatic. Eyes: EOMI. No discharge. Cardiovascular: RRR.  No JVD. Respiratory: CTA bilaterally.  Normal effort. GI: BS +. Non-distended. Musc: No edema or tenderness in extremities. EV:[id number].  Bowel sounds normal. Musculoskeletal: She exhibitsedema and tenderness right hip and shoulder. Neurological: She isalertand oriented  Motor: RUE: 4/5 proximal to distal.  LUE 5/5.  LLE 4+/5 prox to distal.  RLE: 4+/5 HF, KE and 4+/5 ADF/PF Skin: Resolving ecchymosis right clavicle.  Right hip with staples C/D/I Psychiatric: She has anormal mood and affect. Herbehavior is normal.Judgmentand thought contentnormal.   Assessment/Plan: 1. Functional deficits secondary to right femoral neck and right clavicle fracture status post right THA which require 3+ hours per day of interdisciplinary therapy in a comprehensive inpatient rehab setting.  Physiatrist is providing close team supervision and 24 hour management of active medical problems listed below.  Physiatrist and rehab team continue to assess barriers to discharge/monitor patient progress toward functional and medical goals  Care Tool:  Bathing    Body parts  bathed by patient: Right arm, Left arm, Chest, Abdomen, Front perineal area, Right upper leg, Left upper leg, Right lower leg, Left lower leg, Face   Body parts bathed by helper: Buttocks     Bathing assist Assist Level: Contact Guard/Touching assist     Upper Body Dressing/Undressing Upper body dressing   What is the patient wearing?: Pull over shirt, Bra    Upper body assist Assist Level: Minimal Assistance - Patient > 75%    Lower Body Dressing/Undressing Lower body dressing      What is the patient wearing?: Underwear/pull up, Pants     Lower body assist Assist for lower body dressing: Supervision/Verbal cueing     Toileting Toileting    Toileting assist Assist for toileting: Contact Guard/Touching assist     Transfers Chair/bed transfer  Transfers assist     Chair/bed transfer assist level: Contact Guard/Touching assist     Locomotion Ambulation   Ambulation assist      Assist level: Supervision/Verbal cueing Assistive device: Walker-rolling Max distance: 150   Walk 10 feet activity   Assist     Assist level: Supervision/Verbal cueing Assistive device: Walker-rolling   Walk 50 feet activity   Assist    Assist level: Supervision/Verbal cueing Assistive device: Walker-rolling    Walk 150 feet activity   Assist Walk 150 feet activity did not occur: Safety/medical concerns  Assist level: Supervision/Verbal cueing Assistive device: Walker-rolling    Walk 10 feet on uneven surface  activity   Assist Walk 10 feet on uneven surfaces activity did not occur: Safety/medical concerns         Wheelchair     Assist  Will patient use wheelchair at discharge?: No             Wheelchair 50 feet with 2 turns activity    Assist            Wheelchair 150 feet activity     Assist            Medical Problem List and Plan: 1.Functional and mobility deficitssecondary to right femoral neck and right clavicle  fractures. S/p right THA due to fall 06/11/18  Continue CIR PT, OT -plan d/c in am 2. DVT Prophylaxis/Anticoagulation: Pharmaceutical:Lovenoxadded--change back to ASA at discharge.R thigh swelling is primarily pitting edema, will monitor  3. Pain Management:controlled with tramadol qid. Continue tylenol,ice and robaxin prn.  4. Mood:Patient motivated to get better. LCSW to follow for evaluation and support. 5. Neuropsych: This patientiscapable of making decisions on herown behalf. 6. Skin/Wound Care:Monitor wound for healing. Continue nutritional supplements 7. Fluids/Electrolytes/Nutrition:Monitor I/O. Added Ensure supplements as intake poor. 8. ZRA:[id number] BP bid--on Coreg, hydralazine, amlodipine. Now on lower dose coreg due to bradycardia. Continue to monitor HR bid.  HCTZ on hold  Vitals:   06/23/18 2009 06/24/18 0609  BP: (!) 124/52 (!) 159/63  Pulse: 62 76  Resp: 18 18  Temp: 98 F (36.7 C) 98.2 F (36.8 C)  SpO2: 96% 95%   Hydralazine increased to 3 times daily on 12/9-monitor response- BPs variable cont current dose, avoid hypotension to reduce fall risk 9. Anxiety disorder:Mood stable and managed with lexapro. 10. Insomnia: Resumed melatonin.  11. ABLA:  Hemoglobin 10.5 on 12/9  Continue to monitor 12. Hyponateremia:Discontinued HCTZ.   Sodium 135 on 12/9  Continue to monitor  13.Constipation: Reported nausea with poor p.o. Intake  Started MiraLAX daily.   Improving 14.  Hypoalbuminemia  Supplement initiated on 12/4  LOS: 9 days A FACE TO FACE EVALUATION WAS PERFORMED  Charlett Blake 06/24/2018, 7:48 AM

## 2018-06-24 NOTE — Progress Notes (Addendum)
Occupational Therapy Discharge Summary  Patient Details  Name: Jessica Bruce MRN: [id number] Date of Birth: 07/06/1923   Patient has met 8 of 9 long term goals due to improved activity tolerance, improved balance, postural control, ability to compensate for deficits and improved coordination.  Patient to discharge at overall Modified Independent level, recommending supervision for bathing/dressing and IADLs.  Patient plans to d/c back to independent living apartment at assisted living facility with paid caregivers set-up by family. Pt's family members are aware of supervision recommendations for ADLs/IADLs.   Reasons Goals not Met: Pt requires min A to R don sock 2/2 decreased ROM in R LE  Recommendation:  Patient will benefit from ongoing skilled OT services in home health setting to continue to advance functional skills in the area of BADL, iADL and Reduce care partner burden.  Equipment: BSC  Reasons for discharge: treatment goals met and discharge from hospital  Patient/family agrees with progress made and goals achieved: Yes  OT Discharge Precautions/Restrictions  Precautions Precautions: Fall Restrictions Weight Bearing Restrictions: Yes RUE Weight Bearing: Weight bearing as tolerated RLE Weight Bearing: Weight bearing as tolerated Vision Baseline Vision/History: Wears glasses Wears Glasses: Reading only Patient Visual Report: No change from baseline Vision Assessment?: No apparent visual deficits Perception  Perception: Within Functional Limits Praxis Praxis: Intact Cognition Overall Cognitive Status: Within Functional Limits for tasks assessed Orientation Level: Oriented X4 Sustained Attention: Appears intact Selective Attention: Appears intact Memory: Appears intact Awareness: Appears intact Problem Solving: Appears intact Safety/Judgment: Appears intact Sensation Sensation Light Touch: Appears Intact Proprioception: Appears Intact Coordination Gross  Motor Movements are Fluid and Coordinated: Yes Fine Motor Movements are Fluid and Coordinated: Yes Motor  Motor Motor: Within Functional Limits Trunk/Postural Assessment  Cervical Assessment Cervical Assessment: Exceptions to WFL(Forward head) Thoracic Assessment Thoracic Assessment: Exceptions to WFL(Rounded shoulders; kyphotic) Lumbar Assessment Lumbar Assessment: Exceptions to WFL(Posterior pelvic tilt) Postural Control Postural Control: Within Functional Limits  Balance Balance Balance Assessed: Yes Static Sitting Balance Static Sitting - Balance Support: Feet supported;No upper extremity supported Static Sitting - Level of Assistance: 7: Independent Dynamic Sitting Balance Dynamic Sitting - Balance Support: During functional activity;Feet supported Dynamic Sitting - Level of Assistance: 6: Modified independent (Device/Increase time);5: Stand by assistance Sitting balance - Comments: Sitting to complete bathing/dressing tasks Static Standing Balance Static Standing - Balance Support: During functional activity;No upper extremity supported Static Standing - Level of Assistance: 6: Modified independent (Device/Increase time);5: Stand by assistance Dynamic Standing Balance Dynamic Standing - Balance Support: During functional activity;No upper extremity supported Dynamic Standing - Level of Assistance: 6: Modified independent (Device/Increase time);5: Stand by assistance Extremity/Trunk Assessment RUE Assessment RUE Assessment: Exceptions to [facility] Active Range of Motion (AROM) Comments: decreased ROM at shoulder d/t fx 0-100* shoulder flextion/abduction  General Strength Comments: Not formally assessed 2/2 pain/ pre-cautions LUE Assessment LUE Assessment: Within Functional Limits   Jobie Popp L 06/24/2018, 7:25 AM

## 2018-06-24 NOTE — Progress Notes (Signed)
Social Work Patient ID: Jessica Bruce, female   DOB: 12-12-1922, 84 y.o.   MRN: [id number] Met with pt, daughter and RN from [place] meeting to discuss hired caregivers for a short time until she has transitioned back home. Aware [facility] arranged for home health follow up. Preparing for discharge tomorrow.

## 2018-06-24 NOTE — Progress Notes (Signed)
Recreational Therapy Discharge Summary Patient Details  Name: Jessica Bruce MRN: [id number] Date of Birth: 1923-05-09 Today's Date: 06/24/2018  Long term goals set: 1  Long term goals met: 0  Comments on progress toward goals: Pt was referred and scheduled for community reintegration/outing today.  Pt refused due to cold temperatures outside although discussed and agreeable yesterday.  Goal no met due to refusal.  Pt scheduled for discharge to her senior living apartment tomorrow at supervision/Mod I level.  Recommend supervision for community pursuits. Reasons for discharge: discharge from hospital  Follow-up: Encourage participation in Activities programming as available  Patient/family agrees with progress made and goals achieved: Yes  Jessica Bruce 06/24/2018, 12:43 PM

## 2018-06-24 NOTE — Progress Notes (Signed)
Occupational Therapy Session Note  Patient Details  Name: Jessica Bruce MRN: [id number] Date of Birth: 10/18/1922  Today's Date: 06/24/2018 OT Individual Time: 6381-7711 OT Individual Time Calculation (min): 45 min    Short Term Goals: Week 1:  OT Short Term Goal 1 (Week 1): n/a d/t ELOS      Skilled Therapeutic Interventions/Progress Updates:      Pt seen for BADL retraining of toileting, bathing, and dressing with a focus on pt completing tasks in a safe manner with adaptive approaches.  Pt was able to perform all sit to stands to RW and transfers to toilet independently.  She ambulated to shower and transferred in and bathed with a long handled sponge with S.  For dressing, she needs extra time to don bra and can don shoes and tie L shoe, but needed A to tie R and don TED hose.  Pt tolerated session well.  NT arrived to room as pt needed to toilet a 2nd time.    Therapy Documentation Precautions:  Precautions Precautions: Fall Restrictions Weight Bearing Restrictions: Yes RUE Weight Bearing: Weight bearing as tolerated RLE Weight Bearing: Weight bearing as tolerated    Vital Signs: Therapy Vitals Temp: 98.2 F (36.8 C) Pulse Rate: 76 Resp: 18 BP: (!) 159/63 Patient Position (if appropriate): Lying Oxygen Therapy SpO2: 95 % O2 Device: Room Air Pain: Pain Assessment Pain Scale: 0-10 Pain Score: 5  Pain Type: Acute pain Pain Location: Hip Pain Orientation: Right Pain Descriptors / Indicators: Aching Pain Frequency: Intermittent Pain Onset: On-going Patients Stated Pain Goal: 2 Pain Intervention(s): Medication (See eMAR) ADL: ADL Grooming: Supervision/safety Where Assessed-Grooming: Standing at sink Upper Body Bathing: Minimal cueing Where Assessed-Upper Body Bathing: Shower Lower Body Bathing: Minimal cueing Where Assessed-Lower Body Bathing: Shower Upper Body Dressing: Moderate cueing Where Assessed-Upper Body Dressing: Chair Lower Body Dressing:  Maximal assistance Where Assessed-Lower Body Dressing: Chair Toileting: Maximal cueing Where Assessed-Toileting: Glass blower/designer: Minimal assistance, Minimal verbal cueing Toilet Transfer Method: Counselling psychologist: Raised toilet seat Social research officer, government: Curator Method: Print production planner with back Vision Baseline Vision/History: Wears glasses Wears Glasses: Reading only Patient Visual Report: No change from baseline Vision Assessment?: No apparent visual deficits Perception  Perception: Within Functional Limits Praxis Praxis: Intact   Therapy/Group: Individual Therapy  Jessica Bruce 06/24/2018, 8:27 AM

## 2018-06-24 NOTE — Progress Notes (Signed)
Occupational Therapy Note  Patient Details  Name: Jessica Bruce MRN: [id number] Date of Birth: [birth year]  Today's Date: 06/24/2018 OT Missed Time: 90 Minutes Missed Time Reason: Patient unwilling/refused to participate without medical reason  Pt refusing scheduled community outing. Pt sighting it was too cold for outing and declining leaving hospital. Will attempt to make-up time as able.    Jessica Bruce 06/24/2018, 12:35 PM

## 2018-06-25 MED ORDER — CARVEDILOL 3.125 MG PO TABS: 3.1250 mg | ORAL_TABLET | Freq: Two times a day (BID) | ORAL | 0 refills | Status: DC

## 2018-06-25 MED ORDER — ESCITALOPRAM OXALATE 5 MG PO TABS: 5.0000 mg | ORAL_TABLET | Freq: Every day | ORAL | 0 refills | Status: DC

## 2018-06-25 MED ORDER — HYDRALAZINE HCL 10 MG PO TABS: 10.0000 mg | ORAL_TABLET | Freq: Three times a day (TID) | ORAL | 0 refills | Status: DC

## 2018-06-25 MED ORDER — PANTOPRAZOLE SODIUM 40 MG PO TBEC: 40.0000 mg | DELAYED_RELEASE_TABLET | Freq: Every day | ORAL | 0 refills | Status: DC

## 2018-06-25 MED ORDER — DOCUSATE SODIUM 100 MG PO CAPS: 100.0000 mg | ORAL_CAPSULE | Freq: Two times a day (BID) | ORAL | 0 refills | Status: DC

## 2018-06-25 MED ORDER — AMLODIPINE BESYLATE 10 MG PO TABS: 10.0000 mg | ORAL_TABLET | Freq: Every day | ORAL | 0 refills | Status: DC

## 2018-06-25 NOTE — Progress Notes (Signed)
[place] PHYSICAL MEDICINE & REHABILITATION PROGRESS NOTE  Subjective/Complaints:  Asking about staples.  ROS: Denies CP, shortness of breath, nausea, vomiting, diarrhea.  Objective: Vital Signs: Blood pressure (!) 163/67, pulse 84, temperature 98.6 F (37 C), temperature source Oral, resp. rate 15, height 5' (1.524 m), weight 63.2 kg, SpO2 97 %. No results found. No results for input(s): WBC, HGB, HCT, PLT in the last 72 hours. No results for input(s): NA, K, CL, CO2, GLUCOSE, BUN, CREATININE, CALCIUM in the last 72 hours.  Physical Exam: BP (!) 163/67 (BP Location: Right Arm)   Pulse 84   Temp 98.6 F (37 C) (Oral)   Resp 15   Ht 5' (1.524 m)   Wt 63.2 kg   SpO2 97%   BMI 27.21 kg/m  Constitutional: No distress . Vital signs reviewed. HENT: Normocephalic.  Atraumatic. Eyes: EOMI. No discharge. Cardiovascular: RRR.  No JVD. Respiratory: CTA bilaterally.  Normal effort. GI: BS +. Non-distended. Musc: No edema or tenderness in extremities. GH:[id number].  Bowel sounds normal. Musculoskeletal: She exhibitsedema and tenderness right hip and shoulder. Neurological: She isalertand oriented  Motor: RUE: 4/5 proximal to distal.  LUE 5/5.  LLE 4+/5 prox to distal.  RLE: 4+/5 HF, KE and 4+/5 ADF/PF Skin: Resolving ecchymosis right clavicle.  Right hip with staples C/D/I Psychiatric: She has anormal mood and affect. Herbehavior is normal.Judgmentand thought contentnormal.   Assessment/Plan: 1. Functional deficits secondary to right femoral neck and right clavicle fracture status post right THA  Stable for D/C today F/u ortho 1 wk F/u PCP in 3-4 weeks F/u PM&R 2 weeks See D/C summary See D/C instructions Care Tool:  Bathing    Body parts bathed by patient: Right arm, Left arm, Chest, Abdomen, Front perineal area, Right upper leg, Left upper leg, Right lower leg, Left lower leg, Face, Buttocks   Body parts bathed by helper: Buttocks     Bathing assist  Assist Level: Supervision/Verbal cueing     Upper Body Dressing/Undressing Upper body dressing   What is the patient wearing?: Pull over shirt, Bra    Upper body assist Assist Level: Independent    Lower Body Dressing/Undressing Lower body dressing      What is the patient wearing?: Underwear/pull up, Pants     Lower body assist Assist for lower body dressing: Independent     Toileting Toileting    Toileting assist Assist for toileting: Independent     Transfers Chair/bed transfer  Transfers assist     Chair/bed transfer assist level: Independent with assistive device     Locomotion Ambulation   Ambulation assist      Assist level: Independent with assistive device Assistive device: Walker-rolling Max distance: 150   Walk 10 feet activity   Assist     Assist level: Independent with assistive device Assistive device: Walker-rolling   Walk 50 feet activity   Assist    Assist level: Independent with assistive device Assistive device: Walker-rolling    Walk 150 feet activity   Assist Walk 150 feet activity did not occur: Safety/medical concerns  Assist level: Independent with assistive device Assistive device: Walker-rolling    Walk 10 feet on uneven surface  activity   Assist Walk 10 feet on uneven surfaces activity did not occur: Safety/medical concerns   Assist level: Supervision/Verbal cueing     Wheelchair     Assist Will patient use wheelchair at discharge?: No             Wheelchair 50 feet with  2 turns activity    Assist            Wheelchair 150 feet activity     Assist            Medical Problem List and Plan: 1.Functional and mobility deficitssecondary to right femoral neck and right clavicle fractures. S/p right THA due to fall 06/11/18  Continue CIR PT, OT -plan d/c today 2. DVT Prophylaxis/Anticoagulation: Pharmaceutical:Lovenoxadded--change back to ASA at discharge.R thigh  swelling is primarily pitting edema, will monitor  3. Pain Management:controlled with tramadol qid. Continue tylenol,ice and robaxin prn.  4. Mood:Patient motivated to get better. LCSW to follow for evaluation and support. 5. Neuropsych: This patientiscapable of making decisions on herown behalf. 6. Skin/Wound Care:Monitor wound for healing. Continue nutritional supplements 7. Fluids/Electrolytes/Nutrition:Monitor I/O. Added Ensure supplements as intake poor. 8. YQM:VHQIONG BP bid--on Coreg, hydralazine, amlodipine. Now on lower dose coreg due to bradycardia. Continue to monitor HR bid.  HCTZ on hold  Vitals:   06/24/18 1953 06/25/18 0547  BP: (!) 138/56 (!) 163/67  Pulse: 69 84  Resp: 16 15  Temp: 98.5 F (36.9 C) 98.6 F (37 C)  SpO2: 95% 97%   BP variable would not increase BP meds at this point so as to avoid hypotension , fall 9. Anxiety disorder:Mood stable and managed with lexapro. 10. Insomnia: Resumed melatonin.  11. ABLA:  Hemoglobin 10.5 on 12/9  Continue to monitor 12. Hyponateremia:Discontinued HCTZ.   Sodium 135 on 12/9  Continue to monitor  13.Constipation: Reported nausea with poor p.o. Intake  Started MiraLAX daily.   Improving 14.  Hypoalbuminemia  Supplement initiated on 12/4  LOS: 10 days A FACE TO FACE EVALUATION WAS PERFORMED  Charlett Blake 06/25/2018, 7:46 AM

## 2018-06-25 NOTE — Progress Notes (Signed)
Social Work  Discharge Note  The overall goal for the admission was met for:   Discharge location: Yes-BACK TO [place] [name]  Length of Stay: Yes-10 DAYS  Discharge activity level: Yes-MOD/I-SUPERVISION LEVEL  Home/community participation: Yes  Services provided included: MD, RD, PT, OT, RN, CM, TR, Pharmacy and SW  Financial Services: Medicare  Follow-up services arranged: Home Health: [facility], DME: [place] and Patient/Family request agency HH: [name], DME: NO PREF  Comments (or additional information):DAUGHTER HAS HIRED SOME ASSIST FOR PT FOR THE TRANSITION BACK HOME, FOR SHORT TIME. PT DID VERY WELL HERE AND PROGRESSED QUICKLY  Patient/Family verbalized understanding of follow-up arrangements: Yes  Individual responsible for coordination of the follow-up plan: SELF & [name]-DAUGHTER  Confirmed correct DME delivered: [name] 06/25/2018    [name]

## 2018-06-25 NOTE — Discharge Summary (Signed)
Physician Discharge Summary  Patient ID: Jessica DREIBELBIS MRN: [id number] DOB/AGE: [birth year] 82 y.o.  Admit date: 06/15/2018 Discharge date: 06/25/2018  Discharge Diagnoses:  Principal Problem:   Closed fracture of neck of right femur [facility]) Active Problems:   Hip fracture requiring operative repair ([place])   Hypoalbuminemia due to protein-calorie malnutrition ([place])   Postoperative pain   Discharged Condition: stable   Significant Diagnostic Studies: N/A   Labs:  Basic Metabolic Panel: BMP Latest Ref Rng & Units 06/21/2018 06/18/2018 06/16/2018  Glucose 70 - 99 mg/dL 115(H) 110(H) 122(H)  BUN 8 - 23 mg/dL 13 19 21   Creatinine 0.44 - 1.00 mg/dL 0.82 0.83 0.71  Sodium 135 - 145 mmol/L 135 133(L) 133(L)  Potassium 3.5 - 5.1 mmol/L 3.8 4.3 4.3  Chloride 98 - 111 mmol/L 95(L) 94(L) 94(L)  CO2 22 - 32 mmol/L 31 30 31   Calcium 8.9 - 10.3 mg/dL 9.6 9.8 9.9    CBC: CBC Latest Ref Rng & Units 06/21/2018 06/16/2018 06/15/2018  WBC 4.0 - 10.5 K/uL 8.4 9.2 -  Hemoglobin 12.0 - 15.0 g/dL 10.5(L) 10.9(L) 11.0(L)  Hematocrit 36.0 - 46.0 % 32.4(L) 34.3(L) 34.4(L)  Platelets 150 - 400 K/uL 276 189 -    CBG: No results for input(s): GLUCAP in the last 168 hours.  Brief HPI:   Jessica Bruce is a 84 year old female with history of HTN, anxiety disorder, chronic dizziness; who sustained a fall on 06/11/18 with injury to right shoulder as well as right hip.  She was found to have right femoral neck fracture and nondisplaced distal right clavicle fracture.  Right clavicle fracture treated conservatively and she was taken to the OR for right anterior hip hemiarthroplasty by Dr. Ninfa Linden.  Postop to be weightbearing as tolerated and aspirin for DVT prophylaxis.  Hospital course significant for AKI, hyponatremia, ABLA as well as constipation.  Therapy evaluations revealed deficits in mobility and ADLs.  CIR recommended for follow-up therapy  Hospital Course: LUELLEN Bruce was admitted to rehab  06/15/2018 for inpatient therapies to consist of PT and OT at least three hours five days a week. Past admission physiatrist, therapy team and rehab RN have worked together to provide customized collaborative inpatient rehab.  She was maintained on Lovenox for DVT prophylaxis and transition to aspirin at discharge.  Right hip edema is resolving and incision is clean dry and intact.  Follow-up CBC revealed a acute blood loss anemia which is reasonably stable. Check of lytes revealed hyponatremia with sodium down to 129 and therefore HCTZ was discontinued with normalization of sodium level.  Blood pressures have been monitored on a twice daily basis and hydralazine was increased to 3 times daily for better blood pressure control.  MiraLAX was added to help manage constipation.  She is continent of bowel and bladder.  She was reporting improvement in right hip pain therefore  tramadol was DC'd .  Tylenol was scheduled qid and is currently providing effective pain relief. She has made steady progress during her rehab stay and is at modified independent to supervision level.  She will continue to receive further follow-up HHPT, HHOT and [place] by [facility] postdischarge   Rehab course: During patient's stay in rehab weekly team conferences were held to monitor patient's progress, set goals and discuss barriers to discharge. At admission, patient required mod assist with basic ADL tasks and min assist with mobility. She  has had improvement in activity tolerance, balance, postural control as well as ability to compensate for deficits.  Patient requires extra time equipment to complete ADL tasks with supervision.  She is independent with transfers and to ambulate 135 feet with Rollator.  She requires supervision with cues to navigate 8 stairs.  Family was educated regarding need to use Rollator for gait at all times.  They have hired a Actuary to transition patient to assisted living facility for the first  week   Disposition:  Home  Diet: Regular.   Special Instructions: 1. Keep incision clean and dry. 2. Continue ASA for DVT prophylaxis for 4 weeks.    Discharge Instructions    Ambulatory referral to Physical Medicine Rehab   Complete by:  As directed    1-2 weeks transitional care appt     Allergies as of 06/25/2018      Reactions   Azithromycin Nausea And Vomiting   Penicillins Nausea And Vomiting   Penicillins Nausea And Vomiting   .Has patient had a PCN reaction causing immediate rash, facial/tongue/throat swelling, SOB or lightheadedness with hypotension: No Has patient had a PCN reaction causing severe rash involving mucus membranes or skin necrosis: No Has patient had a PCN reaction that required hospitalization No Has patient had a PCN reaction occurring within the last 10 years: No If all of the above answers are "NO", then may proceed with Cephalosporin use.      Medication List    STOP taking these medications   hydrochlorothiazide 25 MG tablet Commonly known as:  HYDRODIURIL   HYDROcodone-acetaminophen 5-325 MG tablet Commonly known as:  NORCO/VICODIN   polyethylene glycol packet Commonly known as:  MIRALAX / GLYCOLAX     TAKE these medications   acetaminophen 325 MG tablet Commonly known as:  TYLENOL Take 1-2 tablets (325-650 mg total) by mouth every 4 (four) hours as needed for mild pain.   amLODipine 10 MG tablet Commonly known as:  NORVASC Take 1 tablet (10 mg total) by mouth daily with supper.   aspirin 325 MG EC tablet Take 1 tablet (325 mg total) by mouth daily with breakfast.   calcium carbonate 500 MG chewable tablet Commonly known as:  TUMS - dosed in mg elemental calcium Chew 2 tablets (400 mg of elemental calcium total) by mouth daily.   carvedilol 3.125 MG tablet Commonly known as:  COREG Take 1 tablet (3.125 mg total) by mouth 2 (two) times daily with a meal.   docusate sodium 100 MG capsule Commonly known as:  COLACE Take 1  capsule (100 mg total) by mouth 2 (two) times daily.   escitalopram 5 MG tablet Commonly known as:  LEXAPRO Take 1 tablet (5 mg total) by mouth at bedtime.   feeding supplement Liqd Take 237 mLs by mouth daily.   hydrALAZINE 10 MG tablet Commonly known as:  APRESOLINE Take 1 tablet (10 mg total) by mouth 3 (three) times daily. What changed:  when to take this   pantoprazole 40 MG tablet Commonly known as:  PROTONIX Take 1 tablet (40 mg total) by mouth daily.   VITAMIN C PO Take 1 tablet by mouth every morning.   VITAMIN D PO Take 1 tablet by mouth every morning.      Follow-up Information    Jamse Arn, MD Follow up.   Specialty:  Physical Medicine and Rehabilitation Why:  Office will call you with follow up appointment Contact information: [address] [place] [address] [phone]        Mcarthur Rossetti, MD. Call.   Specialty:  Orthopedic  Surgery Why:  for follow up appointment Contact information: [place] [address] [phone]        Lajean Manes, MD Follow up on 07/05/2018.   Specialty:  Internal Medicine Why:  Appointment @ 10:00 AM Contact information: [address] [place] [address] [phone]           Signed: Bary Leriche 06/28/2018, 2:30 PM

## 2018-06-25 NOTE — Discharge Instructions (Signed)
Inpatient Rehab Discharge Instructions  Jessica Bruce Discharge date and time:  06/25/18  Activities/Precautions/ Functional Status: Activity: no lifting, driving, or strenuous exercise till cleared by MD Diet: cardiac diet Wound Care: Wash with soap and water--keep wound clean and dry. Contact Dr. Ninfa Linden if you develop any problems with your incision/wound--redness, swelling, increase in pain, drainage or if you develop fever or chills.     Functional status:  ___ No restrictions     ___ Walk up steps independently _X__ 24/7 supervision/assistance   ___ Walk up steps with assistance ___ Intermittent supervision/assistance  ___ Bathe/dress independently _X__ Walk with walker    __X_ Bathe/dress with assistance ___ Walk Independently    ___ Shower independently ___ Walk with assistance    ___ Shower with assistance _X_ No alcohol     ___ Return to work/school ________   Special Instructions:    COMMUNITY REFERRALS UPON DISCHARGE:    Home Health:   PT, OT, RN  Agency:[facility]  Phone:[phone]   Date of last service:06/25/2018  Medical Equipment/Items Ordered:ROLLATOR ROLLING WALKER & 3 IN 1  Agency/Supplier:[facility]   [phone]  Other:HIRING SOME ASSIST FOR A SHORT TIME FOR TRANSITION BACK HOME TO INDEPENDENT APARTMENT AT [place]   My questions have been answered and I understand these instructions. I will adhere to these goals and the provided educational materials after my discharge from the hospital.  Patient/Caregiver Signature _______________________________ Date __________  Clinician Signature _______________________________________ Date __________  Please bring this form and your medication list with you to all your follow-up doctor's appointments.

## 2018-06-28 DIAGNOSIS — M810 Age-related osteoporosis without current pathological fracture: Secondary | ICD-10-CM | POA: Diagnosis not present

## 2018-06-28 DIAGNOSIS — I119 Hypertensive heart disease without heart failure: Secondary | ICD-10-CM | POA: Diagnosis not present

## 2018-06-28 DIAGNOSIS — K5900 Constipation, unspecified: Secondary | ICD-10-CM | POA: Diagnosis not present

## 2018-06-28 DIAGNOSIS — R296 Repeated falls: Secondary | ICD-10-CM | POA: Diagnosis not present

## 2018-06-28 DIAGNOSIS — Z96642 Presence of left artificial hip joint: Secondary | ICD-10-CM | POA: Diagnosis not present

## 2018-06-28 DIAGNOSIS — F419 Anxiety disorder, unspecified: Secondary | ICD-10-CM | POA: Diagnosis not present

## 2018-06-28 DIAGNOSIS — S72011D Unspecified intracapsular fracture of right femur, subsequent encounter for closed fracture with routine healing: Secondary | ICD-10-CM | POA: Diagnosis not present

## 2018-06-28 DIAGNOSIS — Z9181 History of falling: Secondary | ICD-10-CM | POA: Diagnosis not present

## 2018-06-28 DIAGNOSIS — E785 Hyperlipidemia, unspecified: Secondary | ICD-10-CM | POA: Diagnosis not present

## 2018-06-28 DIAGNOSIS — D649 Anemia, unspecified: Secondary | ICD-10-CM | POA: Diagnosis not present

## 2018-06-28 DIAGNOSIS — S41001D Unspecified open wound of right shoulder, subsequent encounter: Secondary | ICD-10-CM | POA: Diagnosis not present

## 2018-06-29 ENCOUNTER — Telehealth: Payer: Self-pay

## 2018-06-29 DIAGNOSIS — E785 Hyperlipidemia, unspecified: Secondary | ICD-10-CM | POA: Diagnosis not present

## 2018-06-29 DIAGNOSIS — I119 Hypertensive heart disease without heart failure: Secondary | ICD-10-CM | POA: Diagnosis not present

## 2018-06-29 DIAGNOSIS — S41001D Unspecified open wound of right shoulder, subsequent encounter: Secondary | ICD-10-CM | POA: Diagnosis not present

## 2018-06-29 DIAGNOSIS — S72011D Unspecified intracapsular fracture of right femur, subsequent encounter for closed fracture with routine healing: Secondary | ICD-10-CM | POA: Diagnosis not present

## 2018-06-29 DIAGNOSIS — F419 Anxiety disorder, unspecified: Secondary | ICD-10-CM | POA: Diagnosis not present

## 2018-06-29 DIAGNOSIS — M810 Age-related osteoporosis without current pathological fracture: Secondary | ICD-10-CM | POA: Diagnosis not present

## 2018-06-29 NOTE — Telephone Encounter (Deleted)
Transitional Care call-Left message on pt VM to call back. No DPR on file    1. Are you/is patient experiencing any problems since coming home? Are there any questions regarding any aspect of care? 2. Are there any questions regarding medications administration/dosing? Are meds being taken as prescribed? Patient should review meds with caller to confirm 3. Have there been any falls? 4. Has Home Health been to the house and/or have they contacted you? If not, have you tried to contact them? Can we help you contact them? 5. Are bowels and bladder emptying properly? Are there any unexpected incontinence issues? If applicable, is patient following bowel/bladder programs? 6. Any fevers, problems with breathing, unexpected pain? 7. Are there any skin problems or new areas of breakdown? 8. Has the patient/family member arranged specialty MD follow up (ie cardiology/neurology/renal/surgical/etc)?  Can we help arrange? 9. Does the patient need any other services or support that we can help arrange? 10. Are caregivers following through as expected in assisting the patient? 11. Has the patient quit smoking, drinking alcohol, or using drugs as recommended?  Appointment time, arrive time 10:00 am for 10:20 am appt with Dr. Posey Pronto [address] [phone]

## 2018-06-29 NOTE — Telephone Encounter (Signed)
Transitional Care call-who you talked with   I spoke with Jessica Bruce    1. Are you/is patient experiencing any problems since coming home? Are there any questions regarding any aspect of care? NO 2. Are there any questions regarding medications administration/dosing? Are meds being taken as prescribed? Patient should review meds with caller to confirm YES 3. Have there been any falls? NO 4. Has Home Health been to the house and/or have they contacted you? If not, have you tried to contact them? Can we help you contact them?YES 5. Are bowels and bladder emptying properly? Are there any unexpected incontinence issues? If applicable, is patient following bowel/bladder programs?NO 6. Any fevers, problems with breathing, unexpected pain? NO 7. Are there any skin problems or new areas of breakdown? NO 8. Has the patient/family member arranged specialty MD follow up (ie cardiology/neurology/renal/surgical/etc)?  Can we help arrange?ARRANGED 9. Does the patient need any other services or support that we can help arrange? NO 10. Are caregivers following through as expected in assisting the patient?YES 11. Has the patient quit smoking, drinking alcohol, or using drugs as recommended?N/A  Appointment Wednesday 07/21/2018 @10 :20 arrive by 10:00 to see Dr Posey Pronto Address reviewed and told to look for packet in the mailbox from our office [place]

## 2018-06-30 ENCOUNTER — Telehealth (INDEPENDENT_AMBULATORY_CARE_PROVIDER_SITE_OTHER): Payer: Self-pay | Admitting: Orthopaedic Surgery

## 2018-06-30 DIAGNOSIS — E785 Hyperlipidemia, unspecified: Secondary | ICD-10-CM | POA: Diagnosis not present

## 2018-06-30 DIAGNOSIS — S72011D Unspecified intracapsular fracture of right femur, subsequent encounter for closed fracture with routine healing: Secondary | ICD-10-CM | POA: Diagnosis not present

## 2018-06-30 DIAGNOSIS — I119 Hypertensive heart disease without heart failure: Secondary | ICD-10-CM | POA: Diagnosis not present

## 2018-06-30 DIAGNOSIS — M810 Age-related osteoporosis without current pathological fracture: Secondary | ICD-10-CM | POA: Diagnosis not present

## 2018-06-30 DIAGNOSIS — F419 Anxiety disorder, unspecified: Secondary | ICD-10-CM | POA: Diagnosis not present

## 2018-06-30 DIAGNOSIS — S41001D Unspecified open wound of right shoulder, subsequent encounter: Secondary | ICD-10-CM | POA: Diagnosis not present

## 2018-06-30 NOTE — Telephone Encounter (Signed)
Gracee with [facility] called to request VO for [facility] PT for 2x a week for 6 weeks.  CB#[phone].  Thank you.

## 2018-06-30 NOTE — Telephone Encounter (Signed)
Verbal order given  

## 2018-07-01 ENCOUNTER — Telehealth (INDEPENDENT_AMBULATORY_CARE_PROVIDER_SITE_OTHER): Payer: Self-pay | Admitting: Orthopaedic Surgery

## 2018-07-01 DIAGNOSIS — E785 Hyperlipidemia, unspecified: Secondary | ICD-10-CM | POA: Diagnosis not present

## 2018-07-01 DIAGNOSIS — F419 Anxiety disorder, unspecified: Secondary | ICD-10-CM | POA: Diagnosis not present

## 2018-07-01 DIAGNOSIS — S41001D Unspecified open wound of right shoulder, subsequent encounter: Secondary | ICD-10-CM | POA: Diagnosis not present

## 2018-07-01 DIAGNOSIS — M810 Age-related osteoporosis without current pathological fracture: Secondary | ICD-10-CM | POA: Diagnosis not present

## 2018-07-01 DIAGNOSIS — S72011D Unspecified intracapsular fracture of right femur, subsequent encounter for closed fracture with routine healing: Secondary | ICD-10-CM | POA: Diagnosis not present

## 2018-07-01 DIAGNOSIS — I119 Hypertensive heart disease without heart failure: Secondary | ICD-10-CM | POA: Diagnosis not present

## 2018-07-01 NOTE — Telephone Encounter (Signed)
[facility]  ([place]     Verbal orders  1 time a week for 2 weeks  2 times a week for 6 weeks  1 time a week for 1 week

## 2018-07-01 NOTE — Telephone Encounter (Signed)
Verbal order left on VM  

## 2018-07-02 ENCOUNTER — Telehealth: Payer: Self-pay | Admitting: *Deleted

## 2018-07-02 DIAGNOSIS — F419 Anxiety disorder, unspecified: Secondary | ICD-10-CM | POA: Diagnosis not present

## 2018-07-02 DIAGNOSIS — M810 Age-related osteoporosis without current pathological fracture: Secondary | ICD-10-CM | POA: Diagnosis not present

## 2018-07-02 DIAGNOSIS — I119 Hypertensive heart disease without heart failure: Secondary | ICD-10-CM | POA: Diagnosis not present

## 2018-07-02 DIAGNOSIS — E785 Hyperlipidemia, unspecified: Secondary | ICD-10-CM | POA: Diagnosis not present

## 2018-07-02 DIAGNOSIS — S72011D Unspecified intracapsular fracture of right femur, subsequent encounter for closed fracture with routine healing: Secondary | ICD-10-CM | POA: Diagnosis not present

## 2018-07-02 DIAGNOSIS — S41001D Unspecified open wound of right shoulder, subsequent encounter: Secondary | ICD-10-CM | POA: Diagnosis not present

## 2018-07-02 NOTE — Telephone Encounter (Signed)
Jessica Bruce, OT, [facility] left a message asking for verbal orders for HHOT 2week1 folowed by 1week1 followed by 2week6 followed by 1week1.  Medical record reviewed. Social work note reviewed.  Verbal orders given per office protocol.

## 2018-07-05 DIAGNOSIS — I119 Hypertensive heart disease without heart failure: Secondary | ICD-10-CM | POA: Diagnosis not present

## 2018-07-05 DIAGNOSIS — S41001D Unspecified open wound of right shoulder, subsequent encounter: Secondary | ICD-10-CM | POA: Diagnosis not present

## 2018-07-05 DIAGNOSIS — F419 Anxiety disorder, unspecified: Secondary | ICD-10-CM | POA: Diagnosis not present

## 2018-07-05 DIAGNOSIS — M810 Age-related osteoporosis without current pathological fracture: Secondary | ICD-10-CM | POA: Diagnosis not present

## 2018-07-05 DIAGNOSIS — S72001A Fracture of unspecified part of neck of right femur, initial encounter for closed fracture: Secondary | ICD-10-CM | POA: Diagnosis not present

## 2018-07-05 DIAGNOSIS — I10 Essential (primary) hypertension: Secondary | ICD-10-CM | POA: Diagnosis not present

## 2018-07-05 DIAGNOSIS — S72011D Unspecified intracapsular fracture of right femur, subsequent encounter for closed fracture with routine healing: Secondary | ICD-10-CM | POA: Diagnosis not present

## 2018-07-05 DIAGNOSIS — Z79899 Other long term (current) drug therapy: Secondary | ICD-10-CM | POA: Diagnosis not present

## 2018-07-05 DIAGNOSIS — E785 Hyperlipidemia, unspecified: Secondary | ICD-10-CM | POA: Diagnosis not present

## 2018-07-06 DIAGNOSIS — E785 Hyperlipidemia, unspecified: Secondary | ICD-10-CM | POA: Diagnosis not present

## 2018-07-06 DIAGNOSIS — I119 Hypertensive heart disease without heart failure: Secondary | ICD-10-CM | POA: Diagnosis not present

## 2018-07-06 DIAGNOSIS — F419 Anxiety disorder, unspecified: Secondary | ICD-10-CM | POA: Diagnosis not present

## 2018-07-06 DIAGNOSIS — S41001D Unspecified open wound of right shoulder, subsequent encounter: Secondary | ICD-10-CM | POA: Diagnosis not present

## 2018-07-06 DIAGNOSIS — M810 Age-related osteoporosis without current pathological fracture: Secondary | ICD-10-CM | POA: Diagnosis not present

## 2018-07-06 DIAGNOSIS — S72011D Unspecified intracapsular fracture of right femur, subsequent encounter for closed fracture with routine healing: Secondary | ICD-10-CM | POA: Diagnosis not present

## 2018-07-08 DIAGNOSIS — M810 Age-related osteoporosis without current pathological fracture: Secondary | ICD-10-CM | POA: Diagnosis not present

## 2018-07-08 DIAGNOSIS — F419 Anxiety disorder, unspecified: Secondary | ICD-10-CM | POA: Diagnosis not present

## 2018-07-08 DIAGNOSIS — S72011D Unspecified intracapsular fracture of right femur, subsequent encounter for closed fracture with routine healing: Secondary | ICD-10-CM | POA: Diagnosis not present

## 2018-07-08 DIAGNOSIS — S41001D Unspecified open wound of right shoulder, subsequent encounter: Secondary | ICD-10-CM | POA: Diagnosis not present

## 2018-07-08 DIAGNOSIS — E785 Hyperlipidemia, unspecified: Secondary | ICD-10-CM | POA: Diagnosis not present

## 2018-07-08 DIAGNOSIS — I119 Hypertensive heart disease without heart failure: Secondary | ICD-10-CM | POA: Diagnosis not present

## 2018-07-09 DIAGNOSIS — M810 Age-related osteoporosis without current pathological fracture: Secondary | ICD-10-CM | POA: Diagnosis not present

## 2018-07-09 DIAGNOSIS — S72011D Unspecified intracapsular fracture of right femur, subsequent encounter for closed fracture with routine healing: Secondary | ICD-10-CM | POA: Diagnosis not present

## 2018-07-09 DIAGNOSIS — I119 Hypertensive heart disease without heart failure: Secondary | ICD-10-CM | POA: Diagnosis not present

## 2018-07-09 DIAGNOSIS — S41001D Unspecified open wound of right shoulder, subsequent encounter: Secondary | ICD-10-CM | POA: Diagnosis not present

## 2018-07-09 DIAGNOSIS — F419 Anxiety disorder, unspecified: Secondary | ICD-10-CM | POA: Diagnosis not present

## 2018-07-09 DIAGNOSIS — E785 Hyperlipidemia, unspecified: Secondary | ICD-10-CM | POA: Diagnosis not present

## 2018-07-12 DIAGNOSIS — F419 Anxiety disorder, unspecified: Secondary | ICD-10-CM | POA: Diagnosis not present

## 2018-07-12 DIAGNOSIS — I119 Hypertensive heart disease without heart failure: Secondary | ICD-10-CM | POA: Diagnosis not present

## 2018-07-12 DIAGNOSIS — S72011D Unspecified intracapsular fracture of right femur, subsequent encounter for closed fracture with routine healing: Secondary | ICD-10-CM | POA: Diagnosis not present

## 2018-07-12 DIAGNOSIS — S41001D Unspecified open wound of right shoulder, subsequent encounter: Secondary | ICD-10-CM | POA: Diagnosis not present

## 2018-07-12 DIAGNOSIS — M810 Age-related osteoporosis without current pathological fracture: Secondary | ICD-10-CM | POA: Diagnosis not present

## 2018-07-12 DIAGNOSIS — E785 Hyperlipidemia, unspecified: Secondary | ICD-10-CM | POA: Diagnosis not present

## 2018-07-13 DIAGNOSIS — E785 Hyperlipidemia, unspecified: Secondary | ICD-10-CM | POA: Diagnosis not present

## 2018-07-13 DIAGNOSIS — F419 Anxiety disorder, unspecified: Secondary | ICD-10-CM | POA: Diagnosis not present

## 2018-07-13 DIAGNOSIS — S41001D Unspecified open wound of right shoulder, subsequent encounter: Secondary | ICD-10-CM | POA: Diagnosis not present

## 2018-07-13 DIAGNOSIS — S72011D Unspecified intracapsular fracture of right femur, subsequent encounter for closed fracture with routine healing: Secondary | ICD-10-CM | POA: Diagnosis not present

## 2018-07-13 DIAGNOSIS — M810 Age-related osteoporosis without current pathological fracture: Secondary | ICD-10-CM | POA: Diagnosis not present

## 2018-07-13 DIAGNOSIS — I119 Hypertensive heart disease without heart failure: Secondary | ICD-10-CM | POA: Diagnosis not present

## 2018-07-15 DIAGNOSIS — S41001D Unspecified open wound of right shoulder, subsequent encounter: Secondary | ICD-10-CM | POA: Diagnosis not present

## 2018-07-15 DIAGNOSIS — S72011D Unspecified intracapsular fracture of right femur, subsequent encounter for closed fracture with routine healing: Secondary | ICD-10-CM | POA: Diagnosis not present

## 2018-07-15 DIAGNOSIS — M810 Age-related osteoporosis without current pathological fracture: Secondary | ICD-10-CM | POA: Diagnosis not present

## 2018-07-15 DIAGNOSIS — F419 Anxiety disorder, unspecified: Secondary | ICD-10-CM | POA: Diagnosis not present

## 2018-07-15 DIAGNOSIS — I119 Hypertensive heart disease without heart failure: Secondary | ICD-10-CM | POA: Diagnosis not present

## 2018-07-15 DIAGNOSIS — E785 Hyperlipidemia, unspecified: Secondary | ICD-10-CM | POA: Diagnosis not present

## 2018-07-16 DIAGNOSIS — S41001D Unspecified open wound of right shoulder, subsequent encounter: Secondary | ICD-10-CM | POA: Diagnosis not present

## 2018-07-16 DIAGNOSIS — M810 Age-related osteoporosis without current pathological fracture: Secondary | ICD-10-CM | POA: Diagnosis not present

## 2018-07-16 DIAGNOSIS — E785 Hyperlipidemia, unspecified: Secondary | ICD-10-CM | POA: Diagnosis not present

## 2018-07-16 DIAGNOSIS — F419 Anxiety disorder, unspecified: Secondary | ICD-10-CM | POA: Diagnosis not present

## 2018-07-16 DIAGNOSIS — S72011D Unspecified intracapsular fracture of right femur, subsequent encounter for closed fracture with routine healing: Secondary | ICD-10-CM | POA: Diagnosis not present

## 2018-07-16 DIAGNOSIS — I119 Hypertensive heart disease without heart failure: Secondary | ICD-10-CM | POA: Diagnosis not present

## 2018-07-19 ENCOUNTER — Ambulatory Visit (INDEPENDENT_AMBULATORY_CARE_PROVIDER_SITE_OTHER): Payer: Medicare Other | Admitting: Orthopaedic Surgery

## 2018-07-19 ENCOUNTER — Encounter (INDEPENDENT_AMBULATORY_CARE_PROVIDER_SITE_OTHER): Payer: Self-pay | Admitting: Orthopaedic Surgery

## 2018-07-19 DIAGNOSIS — Z96649 Presence of unspecified artificial hip joint: Secondary | ICD-10-CM | POA: Insufficient documentation

## 2018-07-19 DIAGNOSIS — H0014 Chalazion left upper eyelid: Secondary | ICD-10-CM | POA: Diagnosis not present

## 2018-07-19 NOTE — Progress Notes (Signed)
The patient is 38 days status post a right hip hemiarthroplasty to treat a femoral neck fracture.  She is 85 years old.  She is doing very well.  She is 5 weeks out from the surgery.  She is ambulating with a walker.  She would like to be able to go back to a cane or to nothing.  She has home therapy coming regularly.  On exam she tolerates me easily putting her right hip to internal and external rotation without any difficulties.  Of note she did have a nondisplaced right clavicle fracture and that is giving her some pain but not much.  On exam she can move her shoulder easily.  She has a little bit of pain to palpation of the clavicle.  This was a nondisplaced fracture.  At this point she will continue to increase her activities as comfort allows.  Home therapy can try to work with transitioning her to a cane when it is appropriate.  We can also set her up for outpatient therapy if needed.  All question concerns were answered and addressed.  Her daughter is with her and knows to give [place] a call if further therapy is needed as an outpatient.

## 2018-07-20 DIAGNOSIS — E785 Hyperlipidemia, unspecified: Secondary | ICD-10-CM | POA: Diagnosis not present

## 2018-07-20 DIAGNOSIS — S41001D Unspecified open wound of right shoulder, subsequent encounter: Secondary | ICD-10-CM | POA: Diagnosis not present

## 2018-07-20 DIAGNOSIS — I119 Hypertensive heart disease without heart failure: Secondary | ICD-10-CM | POA: Diagnosis not present

## 2018-07-20 DIAGNOSIS — M810 Age-related osteoporosis without current pathological fracture: Secondary | ICD-10-CM | POA: Diagnosis not present

## 2018-07-20 DIAGNOSIS — F419 Anxiety disorder, unspecified: Secondary | ICD-10-CM | POA: Diagnosis not present

## 2018-07-20 DIAGNOSIS — S72011D Unspecified intracapsular fracture of right femur, subsequent encounter for closed fracture with routine healing: Secondary | ICD-10-CM | POA: Diagnosis not present

## 2018-07-21 ENCOUNTER — Encounter: Payer: Medicare Other | Attending: Physical Medicine & Rehabilitation | Admitting: Physical Medicine & Rehabilitation

## 2018-07-21 ENCOUNTER — Encounter: Payer: Self-pay | Admitting: Physical Medicine & Rehabilitation

## 2018-07-21 VITALS — BP 154/83 | HR 63 | Ht 60.0 in | Wt 131.6 lb

## 2018-07-21 DIAGNOSIS — I119 Hypertensive heart disease without heart failure: Secondary | ICD-10-CM | POA: Diagnosis not present

## 2018-07-21 DIAGNOSIS — I10 Essential (primary) hypertension: Secondary | ICD-10-CM | POA: Diagnosis not present

## 2018-07-21 DIAGNOSIS — G8918 Other acute postprocedural pain: Secondary | ICD-10-CM | POA: Insufficient documentation

## 2018-07-21 DIAGNOSIS — M810 Age-related osteoporosis without current pathological fracture: Secondary | ICD-10-CM | POA: Diagnosis not present

## 2018-07-21 DIAGNOSIS — R269 Unspecified abnormalities of gait and mobility: Secondary | ICD-10-CM | POA: Diagnosis not present

## 2018-07-21 DIAGNOSIS — E785 Hyperlipidemia, unspecified: Secondary | ICD-10-CM | POA: Diagnosis not present

## 2018-07-21 DIAGNOSIS — F419 Anxiety disorder, unspecified: Secondary | ICD-10-CM | POA: Diagnosis not present

## 2018-07-21 DIAGNOSIS — S41001D Unspecified open wound of right shoulder, subsequent encounter: Secondary | ICD-10-CM | POA: Diagnosis not present

## 2018-07-21 DIAGNOSIS — Z96649 Presence of unspecified artificial hip joint: Secondary | ICD-10-CM | POA: Insufficient documentation

## 2018-07-21 DIAGNOSIS — S72011D Unspecified intracapsular fracture of right femur, subsequent encounter for closed fracture with routine healing: Secondary | ICD-10-CM | POA: Diagnosis not present

## 2018-07-21 NOTE — Progress Notes (Signed)
Subjective:    Patient ID: Jessica Bruce, female    DOB: 18-Feb-1923, 85 y.o.   MRN: [id number]  HPI 85 year old female with history of HTN, anxiety disorder, chronic dizziness presents for hospital follow up after fall on 06/11/18 with injury to right shoulder as well as right hip.   Admit date: 06/15/2018 Discharge date: 06/25/2018  Family provides history. At discharge, she as instructed to follow up with Ortho and was released.  She saw PCP and labs were drawn, which were improved.  Denies falls. Walker at all times.  She is still in therapies.   Pain Inventory Average Pain 3 Pain Right Now 3 My pain is intermittent and aching  In the last 24 hours, has pain interfered with the following? General activity 0 Relation with others 0 Enjoyment of life 0 What TIME of day is your pain at its worst? varies Sleep (in general) Fair  Pain is worse with: walking Pain improves with: therapy/exercise and medication Relief from Meds: 8  Mobility walk with assistance use a walker do you drive?  no  Function retired I need assistance with the following:  dressing, bathing, household duties and shopping  Neuro/Psych No problems in this area  Prior Studies Any changes since last visit?  no  Physicians involved in your care Any changes since last visit?  no   Family History  Problem Relation Age of Onset  . High blood pressure Mother   . Cancer Brother   . Heart failure Father    Social History   Socioeconomic History  . Marital status: Widowed    Spouse name: Not on file  . Number of children: Not on file  . Years of education: Not on file  . Highest education level: Not on file  Occupational History  . Occupation: retired  Scientific laboratory technician  . Financial resource strain: Not on file  . Food insecurity:    Worry: Not on file    Inability: Not on file  . Transportation needs:    Medical: Not on file    Non-medical: Not on file  Tobacco Use  . Smoking status:  Never Smoker  . Smokeless tobacco: Never Used  Substance and Sexual Activity  . Alcohol use: Yes    Alcohol/week: 3.0 standard drinks    Types: 3 Glasses of wine per week  . Drug use: No  . Sexual activity: Not on file  Lifestyle  . Physical activity:    Days per week: Not on file    Minutes per session: Not on file  . Stress: Not on file  Relationships  . Social connections:    Talks on phone: Not on file    Gets together: Not on file    Attends religious service: Not on file    Active member of club or organization: Not on file    Attends meetings of clubs or organizations: Not on file    Relationship status: Not on file  Other Topics Concern  . Not on file  Social History Narrative   ** Merged History Encounter **       Past Surgical History:  Procedure Laterality Date  . ANTERIOR APPROACH HEMI HIP ARTHROPLASTY Right 06/11/2018   Procedure: Right hip hemiarthroplasty direct anterior;  Surgeon: Mcarthur Rossetti, MD;  Location: [place];  Service: Orthopedics;  Laterality: Right;  . CHOLECYSTECTOMY    . COLON SURGERY     pre-cancerous polyp very remotely   Past Medical History:  Diagnosis Date  .  Anxiety   . Closed right hip fracture, initial encounter ([place]) 06/11/2018  . Hyperglycemia   . Hypertension   . Osteoporosis    BP (!) 154/83   Pulse 63   Ht 5' (1.524 m)   Wt 131 lb 9.6 oz (59.7 kg)   SpO2 96%   BMI 25.70 kg/m   Opioid Risk Score:   Fall Risk Score:  `1  Depression screen PHQ 2/9  Depression screen PHQ 2/9 07/21/2018  Decreased Interest 1  Down, Depressed, Hopeless 0  PHQ - 2 Score 1  Altered sleeping 1  Tired, decreased energy 1  Change in appetite 0  Feeling bad or failure about yourself  0  Trouble concentrating 0  Moving slowly or fidgety/restless 0  Suicidal thoughts 0  PHQ-9 Score 3  Difficult doing work/chores Not difficult at all    Review of Systems  Constitutional: Negative.   HENT: Negative.   Eyes: Negative.     Respiratory: Negative.   Cardiovascular: Negative.   Gastrointestinal: Positive for constipation.  Endocrine: Negative.   Genitourinary: Negative.   Musculoskeletal: Negative.   Skin: Negative.   Allergic/Immunologic: Negative.   Neurological: Negative.   Hematological: Negative.   Psychiatric/Behavioral: Negative.   All other systems reviewed and are negative.     Objective:   Physical Exam Constitutional: No distress . Vital signs reviewed. HENT: Normocephalic.  Atraumatic. Eyes: EOMI. No discharge. Cardiovascular: RRR. No JVD. Respiratory: CTA bilaterally. Normal effort. GI: BS +. Non-distended. Musc: No edema or tenderness in extremities. GI: Nondistended.  Bowel sounds normal. Musculoskeletal: No edema or tenderness in extremities Neurological: She is alert and oriented  Motor: RUE: 4+/5 proximal to distal.  LUE 5/5.  LLE 5/5 prox to distal.  RLE: 4+/5 HF, KE ADF/PF Skin: Warm and dry. Intact. Psychiatric: She has a normal mood and affect. Her behavior is normal. Judgment and thought content normal.     Assessment & Plan:  85 year old female with history of HTN, anxiety disorder, chronic dizziness presents for hospital follow up after fall on 06/11/18 with injury to right shoulder as well as right hip.   1. Functional and mobility deficits secondary to right femoral neck and right clavicle fractures. S/p right THA due to fall 06/11/18  Cont therapies  2.  DVT Prophylaxis/Anticoagulation:   Complete 4 week course of ASA per Ortho  3. Pain Management:   Cont tylenol PRN, limit <2g/day  4. HTN:   Cont meds  Elevated today  Recommend follow up with PCP  5. Gait abnormality  Cont therapies  Cont walker for safety

## 2018-07-22 DIAGNOSIS — F419 Anxiety disorder, unspecified: Secondary | ICD-10-CM | POA: Diagnosis not present

## 2018-07-22 DIAGNOSIS — S72011D Unspecified intracapsular fracture of right femur, subsequent encounter for closed fracture with routine healing: Secondary | ICD-10-CM | POA: Diagnosis not present

## 2018-07-22 DIAGNOSIS — S41001D Unspecified open wound of right shoulder, subsequent encounter: Secondary | ICD-10-CM | POA: Diagnosis not present

## 2018-07-22 DIAGNOSIS — I119 Hypertensive heart disease without heart failure: Secondary | ICD-10-CM | POA: Diagnosis not present

## 2018-07-22 DIAGNOSIS — M810 Age-related osteoporosis without current pathological fracture: Secondary | ICD-10-CM | POA: Diagnosis not present

## 2018-07-22 DIAGNOSIS — E785 Hyperlipidemia, unspecified: Secondary | ICD-10-CM | POA: Diagnosis not present

## 2018-07-23 DIAGNOSIS — S41001D Unspecified open wound of right shoulder, subsequent encounter: Secondary | ICD-10-CM | POA: Diagnosis not present

## 2018-07-23 DIAGNOSIS — S72011D Unspecified intracapsular fracture of right femur, subsequent encounter for closed fracture with routine healing: Secondary | ICD-10-CM | POA: Diagnosis not present

## 2018-07-23 DIAGNOSIS — F419 Anxiety disorder, unspecified: Secondary | ICD-10-CM | POA: Diagnosis not present

## 2018-07-23 DIAGNOSIS — M810 Age-related osteoporosis without current pathological fracture: Secondary | ICD-10-CM | POA: Diagnosis not present

## 2018-07-23 DIAGNOSIS — E785 Hyperlipidemia, unspecified: Secondary | ICD-10-CM | POA: Diagnosis not present

## 2018-07-23 DIAGNOSIS — I119 Hypertensive heart disease without heart failure: Secondary | ICD-10-CM | POA: Diagnosis not present

## 2018-07-27 DIAGNOSIS — M810 Age-related osteoporosis without current pathological fracture: Secondary | ICD-10-CM | POA: Diagnosis not present

## 2018-07-27 DIAGNOSIS — F419 Anxiety disorder, unspecified: Secondary | ICD-10-CM | POA: Diagnosis not present

## 2018-07-27 DIAGNOSIS — I119 Hypertensive heart disease without heart failure: Secondary | ICD-10-CM | POA: Diagnosis not present

## 2018-07-27 DIAGNOSIS — E785 Hyperlipidemia, unspecified: Secondary | ICD-10-CM | POA: Diagnosis not present

## 2018-07-27 DIAGNOSIS — S72011D Unspecified intracapsular fracture of right femur, subsequent encounter for closed fracture with routine healing: Secondary | ICD-10-CM | POA: Diagnosis not present

## 2018-07-27 DIAGNOSIS — S41001D Unspecified open wound of right shoulder, subsequent encounter: Secondary | ICD-10-CM | POA: Diagnosis not present

## 2018-07-29 DIAGNOSIS — S72011D Unspecified intracapsular fracture of right femur, subsequent encounter for closed fracture with routine healing: Secondary | ICD-10-CM | POA: Diagnosis not present

## 2018-07-29 DIAGNOSIS — S41001D Unspecified open wound of right shoulder, subsequent encounter: Secondary | ICD-10-CM | POA: Diagnosis not present

## 2018-07-29 DIAGNOSIS — I119 Hypertensive heart disease without heart failure: Secondary | ICD-10-CM | POA: Diagnosis not present

## 2018-07-29 DIAGNOSIS — M810 Age-related osteoporosis without current pathological fracture: Secondary | ICD-10-CM | POA: Diagnosis not present

## 2018-07-29 DIAGNOSIS — E785 Hyperlipidemia, unspecified: Secondary | ICD-10-CM | POA: Diagnosis not present

## 2018-07-29 DIAGNOSIS — F419 Anxiety disorder, unspecified: Secondary | ICD-10-CM | POA: Diagnosis not present

## 2018-08-02 DIAGNOSIS — S72011D Unspecified intracapsular fracture of right femur, subsequent encounter for closed fracture with routine healing: Secondary | ICD-10-CM | POA: Diagnosis not present

## 2018-08-02 DIAGNOSIS — F419 Anxiety disorder, unspecified: Secondary | ICD-10-CM | POA: Diagnosis not present

## 2018-08-02 DIAGNOSIS — S41001D Unspecified open wound of right shoulder, subsequent encounter: Secondary | ICD-10-CM | POA: Diagnosis not present

## 2018-08-02 DIAGNOSIS — E785 Hyperlipidemia, unspecified: Secondary | ICD-10-CM | POA: Diagnosis not present

## 2018-08-02 DIAGNOSIS — M810 Age-related osteoporosis without current pathological fracture: Secondary | ICD-10-CM | POA: Diagnosis not present

## 2018-08-02 DIAGNOSIS — I119 Hypertensive heart disease without heart failure: Secondary | ICD-10-CM | POA: Diagnosis not present

## 2018-08-04 DIAGNOSIS — S41001D Unspecified open wound of right shoulder, subsequent encounter: Secondary | ICD-10-CM | POA: Diagnosis not present

## 2018-08-04 DIAGNOSIS — E785 Hyperlipidemia, unspecified: Secondary | ICD-10-CM | POA: Diagnosis not present

## 2018-08-04 DIAGNOSIS — F419 Anxiety disorder, unspecified: Secondary | ICD-10-CM | POA: Diagnosis not present

## 2018-08-04 DIAGNOSIS — S72011D Unspecified intracapsular fracture of right femur, subsequent encounter for closed fracture with routine healing: Secondary | ICD-10-CM | POA: Diagnosis not present

## 2018-08-04 DIAGNOSIS — M810 Age-related osteoporosis without current pathological fracture: Secondary | ICD-10-CM | POA: Diagnosis not present

## 2018-08-04 DIAGNOSIS — I119 Hypertensive heart disease without heart failure: Secondary | ICD-10-CM | POA: Diagnosis not present

## 2018-08-09 DIAGNOSIS — S72011D Unspecified intracapsular fracture of right femur, subsequent encounter for closed fracture with routine healing: Secondary | ICD-10-CM | POA: Diagnosis not present

## 2018-08-09 DIAGNOSIS — S41001D Unspecified open wound of right shoulder, subsequent encounter: Secondary | ICD-10-CM | POA: Diagnosis not present

## 2018-08-09 DIAGNOSIS — M810 Age-related osteoporosis without current pathological fracture: Secondary | ICD-10-CM | POA: Diagnosis not present

## 2018-08-09 DIAGNOSIS — E785 Hyperlipidemia, unspecified: Secondary | ICD-10-CM | POA: Diagnosis not present

## 2018-08-09 DIAGNOSIS — F419 Anxiety disorder, unspecified: Secondary | ICD-10-CM | POA: Diagnosis not present

## 2018-08-09 DIAGNOSIS — I119 Hypertensive heart disease without heart failure: Secondary | ICD-10-CM | POA: Diagnosis not present

## 2018-08-11 DIAGNOSIS — E785 Hyperlipidemia, unspecified: Secondary | ICD-10-CM | POA: Diagnosis not present

## 2018-08-11 DIAGNOSIS — M810 Age-related osteoporosis without current pathological fracture: Secondary | ICD-10-CM | POA: Diagnosis not present

## 2018-08-11 DIAGNOSIS — I119 Hypertensive heart disease without heart failure: Secondary | ICD-10-CM | POA: Diagnosis not present

## 2018-08-11 DIAGNOSIS — S41001D Unspecified open wound of right shoulder, subsequent encounter: Secondary | ICD-10-CM | POA: Diagnosis not present

## 2018-08-11 DIAGNOSIS — S72011D Unspecified intracapsular fracture of right femur, subsequent encounter for closed fracture with routine healing: Secondary | ICD-10-CM | POA: Diagnosis not present

## 2018-08-11 DIAGNOSIS — F419 Anxiety disorder, unspecified: Secondary | ICD-10-CM | POA: Diagnosis not present

## 2018-08-16 DIAGNOSIS — S72011D Unspecified intracapsular fracture of right femur, subsequent encounter for closed fracture with routine healing: Secondary | ICD-10-CM | POA: Diagnosis not present

## 2018-08-16 DIAGNOSIS — S41001D Unspecified open wound of right shoulder, subsequent encounter: Secondary | ICD-10-CM | POA: Diagnosis not present

## 2018-08-16 DIAGNOSIS — F419 Anxiety disorder, unspecified: Secondary | ICD-10-CM | POA: Diagnosis not present

## 2018-08-16 DIAGNOSIS — I119 Hypertensive heart disease without heart failure: Secondary | ICD-10-CM | POA: Diagnosis not present

## 2018-08-16 DIAGNOSIS — M810 Age-related osteoporosis without current pathological fracture: Secondary | ICD-10-CM | POA: Diagnosis not present

## 2018-08-16 DIAGNOSIS — E785 Hyperlipidemia, unspecified: Secondary | ICD-10-CM | POA: Diagnosis not present

## 2018-09-13 DIAGNOSIS — Z0000 Encounter for general adult medical examination without abnormal findings: Secondary | ICD-10-CM | POA: Diagnosis not present

## 2018-09-13 DIAGNOSIS — I10 Essential (primary) hypertension: Secondary | ICD-10-CM | POA: Diagnosis not present

## 2018-09-13 DIAGNOSIS — R7309 Other abnormal glucose: Secondary | ICD-10-CM | POA: Diagnosis not present

## 2018-09-13 DIAGNOSIS — F411 Generalized anxiety disorder: Secondary | ICD-10-CM | POA: Diagnosis not present

## 2018-09-13 DIAGNOSIS — Z1389 Encounter for screening for other disorder: Secondary | ICD-10-CM | POA: Diagnosis not present

## 2018-09-13 DIAGNOSIS — Z79899 Other long term (current) drug therapy: Secondary | ICD-10-CM | POA: Diagnosis not present

## 2018-09-13 DIAGNOSIS — M810 Age-related osteoporosis without current pathological fracture: Secondary | ICD-10-CM | POA: Diagnosis not present

## 2018-09-23 ENCOUNTER — Encounter: Payer: Medicare Other | Admitting: Physical Medicine & Rehabilitation

## 2018-10-18 ENCOUNTER — Ambulatory Visit: Payer: Medicare Other | Admitting: Physical Medicine & Rehabilitation

## 2018-10-25 ENCOUNTER — Ambulatory Visit: Payer: Medicare Other | Admitting: Physical Medicine & Rehabilitation

## 2018-11-03 ENCOUNTER — Ambulatory Visit: Payer: Medicare Other | Admitting: Physical Medicine & Rehabilitation

## 2019-03-15 ENCOUNTER — Ambulatory Visit
Admission: RE | Admit: 2019-03-15 | Discharge: 2019-03-15 | Disposition: A | Discharge status: Home / Self Care | Payer: Medicare Other | Source: Ambulatory Visit | Attending: Geriatric Medicine | Admitting: Geriatric Medicine

## 2019-03-15 ENCOUNTER — Other Ambulatory Visit: Payer: Self-pay | Admitting: Geriatric Medicine

## 2019-03-15 DIAGNOSIS — R634 Abnormal weight loss: Secondary | ICD-10-CM | POA: Diagnosis not present

## 2019-03-15 DIAGNOSIS — I10 Essential (primary) hypertension: Secondary | ICD-10-CM | POA: Diagnosis not present

## 2019-03-15 DIAGNOSIS — R0689 Other abnormalities of breathing: Secondary | ICD-10-CM | POA: Diagnosis not present

## 2019-03-15 DIAGNOSIS — Z23 Encounter for immunization: Secondary | ICD-10-CM | POA: Diagnosis not present

## 2019-03-15 DIAGNOSIS — Z79899 Other long term (current) drug therapy: Secondary | ICD-10-CM | POA: Diagnosis not present

## 2019-03-15 DIAGNOSIS — R918 Other nonspecific abnormal finding of lung field: Secondary | ICD-10-CM | POA: Diagnosis not present

## 2019-04-14 DIAGNOSIS — Z79899 Other long term (current) drug therapy: Secondary | ICD-10-CM | POA: Diagnosis not present

## 2019-07-04 DIAGNOSIS — Z1159 Encounter for screening for other viral diseases: Secondary | ICD-10-CM | POA: Diagnosis not present

## 2019-07-04 DIAGNOSIS — Z20828 Contact with and (suspected) exposure to other viral communicable diseases: Secondary | ICD-10-CM | POA: Diagnosis not present

## 2019-07-11 DIAGNOSIS — Z20828 Contact with and (suspected) exposure to other viral communicable diseases: Secondary | ICD-10-CM | POA: Diagnosis not present

## 2019-07-11 DIAGNOSIS — Z1159 Encounter for screening for other viral diseases: Secondary | ICD-10-CM | POA: Diagnosis not present

## 2019-07-18 DIAGNOSIS — Z20828 Contact with and (suspected) exposure to other viral communicable diseases: Secondary | ICD-10-CM | POA: Diagnosis not present

## 2019-07-18 DIAGNOSIS — Z1159 Encounter for screening for other viral diseases: Secondary | ICD-10-CM | POA: Diagnosis not present

## 2019-07-25 DIAGNOSIS — Z20828 Contact with and (suspected) exposure to other viral communicable diseases: Secondary | ICD-10-CM | POA: Diagnosis not present

## 2019-07-25 DIAGNOSIS — Z1159 Encounter for screening for other viral diseases: Secondary | ICD-10-CM | POA: Diagnosis not present

## 2019-07-28 DIAGNOSIS — Z1159 Encounter for screening for other viral diseases: Secondary | ICD-10-CM | POA: Diagnosis not present

## 2019-07-28 DIAGNOSIS — Z20828 Contact with and (suspected) exposure to other viral communicable diseases: Secondary | ICD-10-CM | POA: Diagnosis not present

## 2019-08-01 DIAGNOSIS — Z20828 Contact with and (suspected) exposure to other viral communicable diseases: Secondary | ICD-10-CM | POA: Diagnosis not present

## 2019-08-01 DIAGNOSIS — Z1159 Encounter for screening for other viral diseases: Secondary | ICD-10-CM | POA: Diagnosis not present

## 2019-08-04 DIAGNOSIS — Z23 Encounter for immunization: Secondary | ICD-10-CM | POA: Diagnosis not present

## 2019-08-08 DIAGNOSIS — Z20828 Contact with and (suspected) exposure to other viral communicable diseases: Secondary | ICD-10-CM | POA: Diagnosis not present

## 2019-08-08 DIAGNOSIS — Z1159 Encounter for screening for other viral diseases: Secondary | ICD-10-CM | POA: Diagnosis not present

## 2019-08-15 DIAGNOSIS — Z20828 Contact with and (suspected) exposure to other viral communicable diseases: Secondary | ICD-10-CM | POA: Diagnosis not present

## 2019-08-15 DIAGNOSIS — Z1159 Encounter for screening for other viral diseases: Secondary | ICD-10-CM | POA: Diagnosis not present

## 2019-08-22 DIAGNOSIS — Z1159 Encounter for screening for other viral diseases: Secondary | ICD-10-CM | POA: Diagnosis not present

## 2019-08-22 DIAGNOSIS — Z20828 Contact with and (suspected) exposure to other viral communicable diseases: Secondary | ICD-10-CM | POA: Diagnosis not present

## 2019-08-29 DIAGNOSIS — Z1159 Encounter for screening for other viral diseases: Secondary | ICD-10-CM | POA: Diagnosis not present

## 2019-08-29 DIAGNOSIS — Z20828 Contact with and (suspected) exposure to other viral communicable diseases: Secondary | ICD-10-CM | POA: Diagnosis not present

## 2019-09-01 DIAGNOSIS — Z23 Encounter for immunization: Secondary | ICD-10-CM | POA: Diagnosis not present

## 2019-09-05 DIAGNOSIS — Z20828 Contact with and (suspected) exposure to other viral communicable diseases: Secondary | ICD-10-CM | POA: Diagnosis not present

## 2019-09-05 DIAGNOSIS — Z1159 Encounter for screening for other viral diseases: Secondary | ICD-10-CM | POA: Diagnosis not present

## 2019-09-12 DIAGNOSIS — Z1159 Encounter for screening for other viral diseases: Secondary | ICD-10-CM | POA: Diagnosis not present

## 2019-09-12 DIAGNOSIS — Z20828 Contact with and (suspected) exposure to other viral communicable diseases: Secondary | ICD-10-CM | POA: Diagnosis not present

## 2019-09-19 DIAGNOSIS — Z1159 Encounter for screening for other viral diseases: Secondary | ICD-10-CM | POA: Diagnosis not present

## 2019-09-19 DIAGNOSIS — Z20828 Contact with and (suspected) exposure to other viral communicable diseases: Secondary | ICD-10-CM | POA: Diagnosis not present

## 2019-09-26 DIAGNOSIS — Z1159 Encounter for screening for other viral diseases: Secondary | ICD-10-CM | POA: Diagnosis not present

## 2019-09-26 DIAGNOSIS — Z20828 Contact with and (suspected) exposure to other viral communicable diseases: Secondary | ICD-10-CM | POA: Diagnosis not present

## 2019-09-28 DIAGNOSIS — Z1389 Encounter for screening for other disorder: Secondary | ICD-10-CM | POA: Diagnosis not present

## 2019-09-28 DIAGNOSIS — Z79899 Other long term (current) drug therapy: Secondary | ICD-10-CM | POA: Diagnosis not present

## 2019-09-28 DIAGNOSIS — I10 Essential (primary) hypertension: Secondary | ICD-10-CM | POA: Diagnosis not present

## 2019-09-28 DIAGNOSIS — Z0000 Encounter for general adult medical examination without abnormal findings: Secondary | ICD-10-CM | POA: Diagnosis not present

## 2019-09-28 DIAGNOSIS — R197 Diarrhea, unspecified: Secondary | ICD-10-CM | POA: Diagnosis not present

## 2019-10-03 DIAGNOSIS — Z20828 Contact with and (suspected) exposure to other viral communicable diseases: Secondary | ICD-10-CM | POA: Diagnosis not present

## 2019-10-03 DIAGNOSIS — Z1159 Encounter for screening for other viral diseases: Secondary | ICD-10-CM | POA: Diagnosis not present

## 2019-10-10 DIAGNOSIS — Z20828 Contact with and (suspected) exposure to other viral communicable diseases: Secondary | ICD-10-CM | POA: Diagnosis not present

## 2019-10-10 DIAGNOSIS — Z1159 Encounter for screening for other viral diseases: Secondary | ICD-10-CM | POA: Diagnosis not present

## 2019-10-17 DIAGNOSIS — Z20828 Contact with and (suspected) exposure to other viral communicable diseases: Secondary | ICD-10-CM | POA: Diagnosis not present

## 2019-10-17 DIAGNOSIS — Z1159 Encounter for screening for other viral diseases: Secondary | ICD-10-CM | POA: Diagnosis not present

## 2019-10-24 DIAGNOSIS — Z1159 Encounter for screening for other viral diseases: Secondary | ICD-10-CM | POA: Diagnosis not present

## 2019-10-24 DIAGNOSIS — Z20828 Contact with and (suspected) exposure to other viral communicable diseases: Secondary | ICD-10-CM | POA: Diagnosis not present

## 2019-10-31 DIAGNOSIS — Z20828 Contact with and (suspected) exposure to other viral communicable diseases: Secondary | ICD-10-CM | POA: Diagnosis not present

## 2019-10-31 DIAGNOSIS — Z1159 Encounter for screening for other viral diseases: Secondary | ICD-10-CM | POA: Diagnosis not present

## 2019-11-07 DIAGNOSIS — Z20828 Contact with and (suspected) exposure to other viral communicable diseases: Secondary | ICD-10-CM | POA: Diagnosis not present

## 2019-11-07 DIAGNOSIS — Z1159 Encounter for screening for other viral diseases: Secondary | ICD-10-CM | POA: Diagnosis not present

## 2019-11-14 DIAGNOSIS — Z20828 Contact with and (suspected) exposure to other viral communicable diseases: Secondary | ICD-10-CM | POA: Diagnosis not present

## 2019-11-14 DIAGNOSIS — Z1159 Encounter for screening for other viral diseases: Secondary | ICD-10-CM | POA: Diagnosis not present

## 2019-11-18 ENCOUNTER — Emergency Department (HOSPITAL_COMMUNITY): Payer: Medicare Other

## 2019-11-18 ENCOUNTER — Emergency Department (HOSPITAL_COMMUNITY)
Admission: EM | Admit: 2019-11-18 | Discharge: 2019-11-18 | Disposition: A | Discharge status: Home / Self Care | Payer: Medicare Other | Attending: Emergency Medicine | Admitting: Emergency Medicine

## 2019-11-18 ENCOUNTER — Encounter (HOSPITAL_COMMUNITY): Payer: Self-pay

## 2019-11-18 ENCOUNTER — Other Ambulatory Visit: Payer: Self-pay

## 2019-11-18 DIAGNOSIS — S42031A Displaced fracture of lateral end of right clavicle, initial encounter for closed fracture: Secondary | ICD-10-CM | POA: Insufficient documentation

## 2019-11-18 DIAGNOSIS — S299XXA Unspecified injury of thorax, initial encounter: Secondary | ICD-10-CM | POA: Diagnosis not present

## 2019-11-18 DIAGNOSIS — W1830XA Fall on same level, unspecified, initial encounter: Secondary | ICD-10-CM | POA: Diagnosis not present

## 2019-11-18 DIAGNOSIS — R52 Pain, unspecified: Secondary | ICD-10-CM | POA: Diagnosis not present

## 2019-11-18 DIAGNOSIS — Z7982 Long term (current) use of aspirin: Secondary | ICD-10-CM | POA: Diagnosis not present

## 2019-11-18 DIAGNOSIS — Z79899 Other long term (current) drug therapy: Secondary | ICD-10-CM | POA: Diagnosis not present

## 2019-11-18 DIAGNOSIS — R42 Dizziness and giddiness: Secondary | ICD-10-CM | POA: Insufficient documentation

## 2019-11-18 DIAGNOSIS — W19XXXA Unspecified fall, initial encounter: Secondary | ICD-10-CM | POA: Diagnosis not present

## 2019-11-18 DIAGNOSIS — Y999 Unspecified external cause status: Secondary | ICD-10-CM | POA: Insufficient documentation

## 2019-11-18 DIAGNOSIS — I10 Essential (primary) hypertension: Secondary | ICD-10-CM | POA: Diagnosis not present

## 2019-11-18 DIAGNOSIS — Y92129 Unspecified place in nursing home as the place of occurrence of the external cause: Secondary | ICD-10-CM | POA: Insufficient documentation

## 2019-11-18 DIAGNOSIS — Y939 Activity, unspecified: Secondary | ICD-10-CM | POA: Diagnosis not present

## 2019-11-18 DIAGNOSIS — S4991XA Unspecified injury of right shoulder and upper arm, initial encounter: Secondary | ICD-10-CM | POA: Diagnosis present

## 2019-11-18 LAB — CBC WITH DIFFERENTIAL/PLATELET
Abs Immature Granulocytes: 0.06 10*3/uL (ref 0.00–0.07)
Basophils Absolute: 0 10*3/uL (ref 0.0–0.1)
Basophils Relative: 1 %
Eosinophils Absolute: 0.2 10*3/uL (ref 0.0–0.5)
Eosinophils Relative: 2 %
HCT: 45.8 % (ref 36.0–46.0)
Hemoglobin: 15.2 g/dL — ABNORMAL HIGH (ref 12.0–15.0)
Immature Granulocytes: 1 %
Lymphocytes Relative: 14 %
Lymphs Abs: 1.2 10*3/uL (ref 0.7–4.0)
MCH: 31.7 pg (ref 26.0–34.0)
MCHC: 33.2 g/dL (ref 30.0–36.0)
MCV: 95.6 fL (ref 80.0–100.0)
Monocytes Absolute: 0.5 10*3/uL (ref 0.1–1.0)
Monocytes Relative: 5 %
Neutro Abs: 6.5 10*3/uL (ref 1.7–7.7)
Neutrophils Relative %: 77 %
Platelets: 174 10*3/uL (ref 150–400)
RBC: 4.79 MIL/uL (ref 3.87–5.11)
RDW: 12.8 % (ref 11.5–15.5)
WBC: 8.5 10*3/uL (ref 4.0–10.5)
nRBC: 0 % (ref 0.0–0.2)

## 2019-11-18 LAB — BASIC METABOLIC PANEL
Anion gap: 12 (ref 5–15)
BUN: 22 mg/dL (ref 8–23)
CO2: 23 mmol/L (ref 22–32)
Calcium: 10.3 mg/dL (ref 8.9–10.3)
Chloride: 103 mmol/L (ref 98–111)
Creatinine, Ser: 0.95 mg/dL (ref 0.44–1.00)
GFR calc Af Amer: 59 mL/min — ABNORMAL LOW (ref >60)
GFR calc non Af Amer: 51 mL/min — ABNORMAL LOW (ref >60)
Glucose, Bld: 113 mg/dL — ABNORMAL HIGH (ref 70–99)
Potassium: 3.9 mmol/L (ref 3.5–5.1)
Sodium: 138 mmol/L (ref 135–145)

## 2019-11-18 MED ORDER — MORPHINE SULFATE (PF) 2 MG/ML IV SOLN
2.0000 mg | Freq: Once | INTRAVENOUS | Status: AC
  Administered 2019-11-18: 2 mg via INTRAVENOUS
  Filled 2019-11-18: qty 1

## 2019-11-18 MED ORDER — ACETAMINOPHEN 325 MG PO TABS: 650.0000 mg | ORAL_TABLET | Freq: Four times a day (QID) | ORAL | 1 refills | Status: DC | PRN

## 2019-11-18 MED ORDER — IBUPROFEN 600 MG PO TABS: 600.0000 mg | ORAL_TABLET | Freq: Four times a day (QID) | ORAL | 0 refills | Status: AC | PRN

## 2019-11-18 MED ORDER — IBUPROFEN 400 MG PO TABS
600.0000 mg | ORAL_TABLET | Freq: Once | ORAL | Status: AC
  Administered 2019-11-18: 600 mg via ORAL
  Filled 2019-11-18: qty 1

## 2019-11-18 MED ORDER — MORPHINE SULFATE (PF) 4 MG/ML IV SOLN: 4.0000 mg | Freq: Once | INTRAVENOUS | Status: DC

## 2019-11-18 MED ORDER — NAPROXEN 500 MG PO TABS: 500.0000 mg | ORAL_TABLET | Freq: Two times a day (BID) | ORAL | 0 refills | Status: AC

## 2019-11-18 NOTE — ED Provider Notes (Signed)
[facility] EMERG[facility] DEPARTMENT Provider Note   CSN: [id number] Arrival date & time: 11/18/19  1226     History No chief complaint on file.   Jessica Bruce is a 86 y.o. female.  Patient is a 86 year old female coming from assisted living presenting to the emergency department for dizziness and fall.  Patient reports that she was up and walking around her apartment today when she suddenly felt dizzy and fell onto her right shoulder hitting her head.  She reports no history of the same. Fell directly on her R shoulder and hit her head. Dizziness now resolved.         Past Medical History:  Diagnosis Date  . Anxiety   . Closed right hip fracture, initial encounter ([place]) 06/11/2018  . Hyperglycemia   . Hypertension   . Osteoporosis     Patient Active Problem List   Diagnosis Date Noted  . 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 Postoperative pain   . Hypoalbuminemia due to protein-calorie malnutrition ([place])   . Hip fracture requiring operative repair ([place]) 06/15/2018  . Closed fracture of neck of right femur ([place])   . Drug induced constipation   . Acute blood loss anemia   . Benign essential HTN   . Anxiety   . Hyponatremia   . Closed right hip fracture, initial encounter ([place]) 06/11/2018  . Right clavicle fracture 06/11/2018  . Hypertension   . Hyperglycemia     Past Surgical History:  Procedure Laterality Date  . ANTERIOR APPROACH HEMI HIP ARTHROPLASTY Right 06/11/2018   Procedure: Right hip hemiarthroplasty direct anterior;  Surgeon: Mcarthur Rossetti, MD;  Location: [facility];  Service: Orthopedics;  Laterality: Right;  . CHOLECYSTECTOMY    . COLON SURGERY     pre-cancerous polyp very remotely     OB History   No obstetric history on file.     Family History  Problem Relation Age of Onset  . High blood pressure Mother   . Cancer Brother   . Heart failure Father     Social History   Tobacco Use  . Smoking status:  Never Smoker  . Smokeless tobacco: Never Used  Substance Use Topics  . Alcohol use: Yes    Alcohol/week: 3.0 standard drinks    Types: 3 Glasses of wine per week  . Drug use: No    Home Medications Prior to Admission medications   Medication Sig Start Date End Date Taking? Authorizing Provider  acetaminophen (TYLENOL) 325 MG tablet Take 1-2 tablets (325-650 mg total) by mouth every 4 (four) hours as needed for mild pain. 06/16/18   Love, Ivan Anchors, PA-C  amLODipine (NORVASC) 10 MG tablet Take 1 tablet (10 mg total) by mouth daily with supper. 06/25/18   Love, Ivan Anchors, PA-C  Ascorbic Acid (VITAMIN C PO) Take 1 tablet by mouth every morning.    [provider]  aspirin EC 325 MG EC tablet Take 1 tablet (325 mg total) by mouth daily with breakfast. 06/15/18   Mcarthur Rossetti, MD  calcium carbonate (TUMS - DOSED IN MG ELEMENTAL CALCIUM) 500 MG chewable tablet Chew 2 tablets (400 mg of elemental calcium total) by mouth daily. 06/16/18   Mercy Riding, MD  carvedilol (COREG) 3.125 MG tablet Take 1 tablet (3.125 mg total) by mouth 2 (two) times daily with a meal. 06/25/18   Love, Ivan Anchors, PA-C  docusate sodium (COLACE) 100 MG capsule Take 1 capsule (100 mg total) by mouth  2 (two) times daily. 06/25/18   Love, Ivan Anchors, PA-C  escitalopram (LEXAPRO) 5 MG tablet Take 1 tablet (5 mg total) by mouth at bedtime. 06/25/18   Love, Ivan Anchors, PA-C  feeding supplement (ENSURE SURGERY) LIQD Take 237 mLs by mouth daily. 06/15/18   Mercy Riding, MD  hydrALAZINE (APRESOLINE) 10 MG tablet Take 1 tablet (10 mg total) by mouth 3 (three) times daily. 06/25/18   Love, Ivan Anchors, PA-C  pantoprazole (PROTONIX) 40 MG tablet Take 1 tablet (40 mg total) by mouth daily. 06/25/18   Love, Ivan Anchors, PA-C  tobramycin (TOBREX) 0.3 % ophthalmic solution Place into the left eye every 4 (four) hours.    [provider]  VITAMIN D PO Take 1 tablet by mouth every morning.    [provider]     Allergies    Azithromycin, Penicillins, and Penicillins  Review of Systems   Review of Systems  Constitutional: Negative for chills and fever.  HENT: Negative for ear pain and sore throat.   Eyes: Negative for pain and visual disturbance.  Respiratory: Negative for cough and shortness of breath.   Cardiovascular: Negative for chest pain and palpitations.  Gastrointestinal: Negative for abdominal pain and vomiting.  Genitourinary: Negative for dysuria and hematuria.  Musculoskeletal: Positive for arthralgias and joint swelling. Negative for back pain, neck pain and neck stiffness.  Skin: Negative for color change and rash.  Neurological: Positive for dizziness. Negative for seizures and syncope.  All other systems reviewed and are negative.   Physical Exam Updated Vital Signs BP (!) 159/58 (BP Location: Left Arm)   Pulse 64   Temp 97.8 F (36.6 C) (Oral)   Resp 16   Ht 5' (1.524 m)   Wt 57.6 kg   SpO2 97%   BMI 24.80 kg/m   Physical Exam Vitals and nursing note reviewed.  Constitutional:      General: She is not in acute distress.    Appearance: Normal appearance. She is not ill-appearing, toxic-appearing or diaphoretic.  HENT:     Head: Normocephalic.     Nose: Nose normal.     Mouth/Throat:     Mouth: Mucous membranes are moist.  Eyes:     Conjunctiva/sclera: Conjunctivae normal.  Cardiovascular:     Rate and Rhythm: Normal rate.  Pulmonary:     Effort: Pulmonary effort is normal.     Breath sounds: Normal breath sounds.  Abdominal:     General: Abdomen is flat.     Palpations: Abdomen is soft.  Musculoskeletal:     Comments: Right distal clavicle deformity and tenderness to palpation.  Neurovascularly intact in the upper extremities.  Skin:    General: Skin is warm and dry.     Capillary Refill: Capillary refill takes less than 2 seconds.  Neurological:     General: No focal deficit present.     Mental Status: She is alert.  Psychiatric:        Mood  and Affect: Mood normal.     ED Results / Procedures / Treatments   Labs (all labs ordered are listed, but only abnormal results are displayed) Labs Reviewed  BASIC METABOLIC PANEL - Abnormal; Notable for the following components:      Result Value   Glucose, Bld 113 (*)    GFR calc non Af Amer 51 (*)    GFR calc Af Amer 59 (*)    All other components within normal limits  CBC WITH DIFFERENTIAL/PLATELET - Abnormal; Notable  for the following components:   Hemoglobin 15.2 (*)    All other components within normal limits    EKG None  Radiology DG Chest Portable 1 View  Result Date: 11/18/2019 CLINICAL DATA:  Status post fall today.  Right shoulder pain. EXAM: PORTABLE CHEST 1 VIEW COMPARISON:  PA and lateral chest 03/2019. FINDINGS: Lungs clear. Heart size normal. No pneumothorax or pleural effusion. Atherosclerosis noted. There appears to be a fracture of the distal right clavicle. IMPRESSION: No acute cardiopulmonary disease. Fracture distal right clavicle. Aortic Atherosclerosis (ICD10-I70.0). Electronically Signed   By: Inge Rise M.D.   On: 11/18/2019 14:49   DG Shoulder Right Portable  Result Date: 11/18/2019 CLINICAL DATA:  Status post fall shoulder no chest complaints. EXAM: PORTABLE RIGHT SHOULDER COMPARISON:  None. FINDINGS: No glenohumeral fracture or dislocation. Generalized osteopenia. Moderate arthropathy of the acromioclavicular joint. Comminuted fracture of the distal clavicle with 10 mm of inferior displacement. IMPRESSION: Comminuted fracture of the distal clavicle with 10 mm of inferior displacement. Electronically Signed   By: Kathreen Devoid   On: 11/18/2019 14:49    Procedures Procedures (including critical care time)  Medications Ordered in ED Medications  morphine 2 MG/ML injection 2 mg (has no administration in time range)    ED Course  I have reviewed the triage vital signs and the nursing notes.  Pertinent labs & imaging results that were available  during my care of the patient were reviewed by me and considered in my medical decision making (see chart for details).  Clinical Course as of Nov 19 709  Fri Nov 18, 2019  1446 86 y/o from assisted living with shoulder injury after fall. Initially had brief episode of dizziness causing fall which is now resolved. She has a distal comminuted clavicle fracture with displacement. Skin is swollen but not tented. Neurovascularly in tact. Discussed with Arlana Pouch PA with ortho who reviewed xrays and agrees patient can f/u outpatient after being placed in sling.    [KM]  1547 Discussed with Mariann Laster from case management who is going to call daughter and work with facility to see what kind of care the patient can get to assist her at home with her fracture. Pending CM follow up. Care passed to [facility] resident due to change of shift.    [KM]  1618 I spoke to the patient's daughter Ms Lynett Fish who will come pick her mother up and bring her back down to [place] for several days.  Her daughter tells me that the patient's assisted living community DOES have home health and they used these services last year when her mother broke her hip. It may take a few days to set this up.    [MT]    Clinical Course User Index [KM] Alveria Apley, PA-C [MT] Langston Masker Carola Rhine, MD   MDM Rules/Calculators/A&P                       Final Clinical Impression(s) / ED Diagnoses Final diagnoses:  None    Rx / DC Orders ED Discharge Orders    None       Kristine Royal 11/20/19 [id number]    Wyvonnia Dusky, MD 11/20/19 [phone]

## 2019-11-18 NOTE — ED Triage Notes (Signed)
2 day hx of increasing weakness and poor intake, resulting in dizziness today and fall injuring R shoulder.  C/o pain 9/10 with movement per EMS.   No IV or BG taken en route.

## 2019-11-18 NOTE — Progress Notes (Signed)
Orthopedic Tech Progress Note Patient Details:  Jessica Bruce [birth year] [id number]  Ortho Devices Type of Ortho Device: Shoulder immobilizer Ortho Device/Splint Location: RUE Ortho Device/Splint Interventions: Ordered, Application   Post Interventions Patient Tolerated: Well Instructions Provided: Care of [place] 11/18/2019, 6:53 PM

## 2019-11-18 NOTE — Discharge Instructions (Addendum)
You are seen today for a fall.  Your shoulder x-ray showed that you have a fracture in your clavicle bone (your right collar bone).  Our orthopedic specialist have reviewed your x-rays and would like your arm to be placed in a sling.  Likely, you will not need surgery.   You will need to wear this sling AT ALL TIMES until you are cleared to take it off by the othopedic doctor.  You should schedule an appointment with them in their clinic (Dr Doreatha Martin) in 1-2 weeks at the number above.  For pain at home you can take motrin 600 mg every 6 hours for the next 7 days.  Do not exceed 7 days of motrin, and take motrin with a small amount of food.    You also take tylenol 650 mg every 6 hours as long as you need to.  There is no day limit to taking tylenol for pain.  You can also use ice packs on your shoulder for 10-15 minutes at a time at home.  It will likely take 4-6 weeks for your shoulder to heal.  Otherwise your xrays and CT scans were reassuring here.  There was no evidence of brain bleed or spine fracture.  Thank you for allowing me to care for you today. Please return to the emergency department if you have new or worsening symptoms, including numbness in your right arm, or signs of the bone poking through (or sticking out) of your skin near your shoulder.  These can be emergencies that need attention.

## 2019-11-18 NOTE — ED Notes (Signed)
Pt moved from hallway to room 20.  Pt alert and oriented, states the dizziness came over like a wave.  Was walking and putting things away in her apartment.  Denies any current dizziness or CP.  She does state that she has been eating her regular 3 meals a day, but less appetite.  Did strike her head with the fall with no LOC.

## 2019-11-18 NOTE — ED Notes (Signed)
Pt has questions about dispo. Reviewed with MD and CM to loook for (A) home.  MD to call daughter. Pt updated on POC.

## 2019-11-18 NOTE — ED Provider Notes (Signed)
Care of the patient was assumed from [employer] PA-C at 1550; see this 27 note for complete history of present illness, review of systems, and physical exam.  Briefly, the patient is a 86 y.o. female who presented to the ED with fall and shoulder injury.  History significant for residence in [place].  Plan at time of handoff:  -Sling for comfort. F/u w/ ortho. She will need assistance with ADLs at home after her shoulder injury. Mariann Laster working on it.   MDM/ED Course: Patient's daughter was contacted and stated that she would be able to assist her at home as well as to pick her up from the ED today around 7 PM.  Patient remained comfortable in the ED without any further need for analgesia.  She was discharged in stable condition.  Patient seen in conjunction with the attending physician, Dr. Maryan Rued, MD, who participated in all aspects of the patient's care and was in agreement with the above plan.   Emergency Department Medication Summary: Medications  morphine 2 MG/ML injection 2 mg (2 mg Intravenous Given 11/18/19 1556)  ibuprofen (ADVIL) tablet 600 mg (600 mg Oral Given 11/18/19 1723)   Clinical Impression: 1. Fall, initial encounter   2. Closed displaced fracture of acromial end of right clavicle, initial encounter      Godfrey Pick, MD 11/19/19 [id number]    Blanchie Dessert, MD 11/21/19 [id number]

## 2019-11-18 NOTE — ED Notes (Signed)
Pt verbalized understanding of discharge instructions. Follow up care, prescriptions, and pain management reviewed. Pt had no further questions.

## 2019-11-18 NOTE — Care Management (Signed)
Discussed with EDP who spoke with patient's family, who have decided to come from [place] and pick patient up and take her home until [facility] can make arrangements for [facility] services at the facility.

## 2019-11-18 NOTE — Care Management (Signed)
ED CM spoke with K. Aundra Dubin PA-C CM will follow up.

## 2019-11-30 DIAGNOSIS — S42001A Fracture of unspecified part of right clavicle, initial encounter for closed fracture: Secondary | ICD-10-CM | POA: Diagnosis not present

## 2019-12-01 DIAGNOSIS — Z20828 Contact with and (suspected) exposure to other viral communicable diseases: Secondary | ICD-10-CM | POA: Diagnosis not present

## 2019-12-01 DIAGNOSIS — Z1159 Encounter for screening for other viral diseases: Secondary | ICD-10-CM | POA: Diagnosis not present

## 2019-12-05 DIAGNOSIS — Z20828 Contact with and (suspected) exposure to other viral communicable diseases: Secondary | ICD-10-CM | POA: Diagnosis not present

## 2019-12-05 DIAGNOSIS — Z1159 Encounter for screening for other viral diseases: Secondary | ICD-10-CM | POA: Diagnosis not present

## 2019-12-12 DIAGNOSIS — Z1159 Encounter for screening for other viral diseases: Secondary | ICD-10-CM | POA: Diagnosis not present

## 2019-12-12 DIAGNOSIS — Z20828 Contact with and (suspected) exposure to other viral communicable diseases: Secondary | ICD-10-CM | POA: Diagnosis not present

## 2019-12-13 DIAGNOSIS — F320 Major depressive disorder, single episode, mild: Secondary | ICD-10-CM | POA: Diagnosis not present

## 2019-12-13 DIAGNOSIS — I10 Essential (primary) hypertension: Secondary | ICD-10-CM | POA: Diagnosis not present

## 2019-12-13 DIAGNOSIS — Z79899 Other long term (current) drug therapy: Secondary | ICD-10-CM | POA: Diagnosis not present

## 2019-12-19 DIAGNOSIS — Z1159 Encounter for screening for other viral diseases: Secondary | ICD-10-CM | POA: Diagnosis not present

## 2019-12-19 DIAGNOSIS — Z20828 Contact with and (suspected) exposure to other viral communicable diseases: Secondary | ICD-10-CM | POA: Diagnosis not present

## 2019-12-21 DIAGNOSIS — Z85828 Personal history of other malignant neoplasm of skin: Secondary | ICD-10-CM | POA: Diagnosis not present

## 2019-12-21 DIAGNOSIS — D0439 Carcinoma in situ of skin of other parts of face: Secondary | ICD-10-CM | POA: Diagnosis not present

## 2019-12-21 DIAGNOSIS — S42001D Fracture of unspecified part of right clavicle, subsequent encounter for fracture with routine healing: Secondary | ICD-10-CM | POA: Diagnosis not present

## 2019-12-21 DIAGNOSIS — D485 Neoplasm of uncertain behavior of skin: Secondary | ICD-10-CM | POA: Diagnosis not present

## 2020-01-30 DIAGNOSIS — Z1159 Encounter for screening for other viral diseases: Secondary | ICD-10-CM | POA: Diagnosis not present

## 2020-01-30 DIAGNOSIS — Z20828 Contact with and (suspected) exposure to other viral communicable diseases: Secondary | ICD-10-CM | POA: Diagnosis not present

## 2020-02-13 DIAGNOSIS — Z20828 Contact with and (suspected) exposure to other viral communicable diseases: Secondary | ICD-10-CM | POA: Diagnosis not present

## 2020-02-13 DIAGNOSIS — Z1159 Encounter for screening for other viral diseases: Secondary | ICD-10-CM | POA: Diagnosis not present

## 2020-02-20 ENCOUNTER — Other Ambulatory Visit: Payer: Self-pay | Admitting: Physical Medicine and Rehabilitation

## 2020-02-20 DIAGNOSIS — Z1159 Encounter for screening for other viral diseases: Secondary | ICD-10-CM | POA: Diagnosis not present

## 2020-02-20 DIAGNOSIS — Z20828 Contact with and (suspected) exposure to other viral communicable diseases: Secondary | ICD-10-CM | POA: Diagnosis not present

## 2020-02-27 DIAGNOSIS — Z1159 Encounter for screening for other viral diseases: Secondary | ICD-10-CM | POA: Diagnosis not present

## 2020-02-27 DIAGNOSIS — Z20828 Contact with and (suspected) exposure to other viral communicable diseases: Secondary | ICD-10-CM | POA: Diagnosis not present

## 2020-03-05 DIAGNOSIS — Z20828 Contact with and (suspected) exposure to other viral communicable diseases: Secondary | ICD-10-CM | POA: Diagnosis not present

## 2020-03-05 DIAGNOSIS — Z1159 Encounter for screening for other viral diseases: Secondary | ICD-10-CM | POA: Diagnosis not present

## 2020-03-07 DIAGNOSIS — Z1159 Encounter for screening for other viral diseases: Secondary | ICD-10-CM | POA: Diagnosis not present

## 2020-03-07 DIAGNOSIS — Z20828 Contact with and (suspected) exposure to other viral communicable diseases: Secondary | ICD-10-CM | POA: Diagnosis not present

## 2020-03-12 DIAGNOSIS — Z20828 Contact with and (suspected) exposure to other viral communicable diseases: Secondary | ICD-10-CM | POA: Diagnosis not present

## 2020-03-12 DIAGNOSIS — Z1159 Encounter for screening for other viral diseases: Secondary | ICD-10-CM | POA: Diagnosis not present

## 2020-03-19 DIAGNOSIS — Z1159 Encounter for screening for other viral diseases: Secondary | ICD-10-CM | POA: Diagnosis not present

## 2020-03-19 DIAGNOSIS — Z20828 Contact with and (suspected) exposure to other viral communicable diseases: Secondary | ICD-10-CM | POA: Diagnosis not present

## 2020-03-29 DIAGNOSIS — I10 Essential (primary) hypertension: Secondary | ICD-10-CM | POA: Diagnosis not present

## 2020-03-29 DIAGNOSIS — Z23 Encounter for immunization: Secondary | ICD-10-CM | POA: Diagnosis not present

## 2020-04-02 DIAGNOSIS — Z1159 Encounter for screening for other viral diseases: Secondary | ICD-10-CM | POA: Diagnosis not present

## 2020-04-02 DIAGNOSIS — Z20828 Contact with and (suspected) exposure to other viral communicable diseases: Secondary | ICD-10-CM | POA: Diagnosis not present

## 2020-04-09 DIAGNOSIS — Z1159 Encounter for screening for other viral diseases: Secondary | ICD-10-CM | POA: Diagnosis not present

## 2020-04-09 DIAGNOSIS — Z20828 Contact with and (suspected) exposure to other viral communicable diseases: Secondary | ICD-10-CM | POA: Diagnosis not present

## 2020-04-16 DIAGNOSIS — Z1159 Encounter for screening for other viral diseases: Secondary | ICD-10-CM | POA: Diagnosis not present

## 2020-04-16 DIAGNOSIS — Z20828 Contact with and (suspected) exposure to other viral communicable diseases: Secondary | ICD-10-CM | POA: Diagnosis not present

## 2020-04-23 DIAGNOSIS — Z1159 Encounter for screening for other viral diseases: Secondary | ICD-10-CM | POA: Diagnosis not present

## 2020-04-23 DIAGNOSIS — Z20828 Contact with and (suspected) exposure to other viral communicable diseases: Secondary | ICD-10-CM | POA: Diagnosis not present

## 2020-04-30 DIAGNOSIS — Z20828 Contact with and (suspected) exposure to other viral communicable diseases: Secondary | ICD-10-CM | POA: Diagnosis not present

## 2020-04-30 DIAGNOSIS — Z1159 Encounter for screening for other viral diseases: Secondary | ICD-10-CM | POA: Diagnosis not present

## 2020-05-07 DIAGNOSIS — Z1159 Encounter for screening for other viral diseases: Secondary | ICD-10-CM | POA: Diagnosis not present

## 2020-05-07 DIAGNOSIS — Z20828 Contact with and (suspected) exposure to other viral communicable diseases: Secondary | ICD-10-CM | POA: Diagnosis not present

## 2020-05-21 DIAGNOSIS — Z20828 Contact with and (suspected) exposure to other viral communicable diseases: Secondary | ICD-10-CM | POA: Diagnosis not present

## 2020-05-21 DIAGNOSIS — Z1159 Encounter for screening for other viral diseases: Secondary | ICD-10-CM | POA: Diagnosis not present

## 2020-05-25 IMAGING — CT CT CERVICAL SPINE W/O CM
3 of 4 series · 13 of 33 positions shown, 16 images · non-contrast
Comparison: 06/11/2018

CLINICAL DATA: Critical poly trauma.  Dizziness with fall.

EXAM:
CT HEAD WITHOUT CONTRAST
CT CERVICAL SPINE WITHOUT CONTRAST
TECHNIQUE: Multidetector CT imaging of the head and cervical spine was
performed following the standard protocol without intravenous
contrast. Multiplanar CT image reconstructions of the cervical spine
were also generated.

[Series 4: c_spine 2.0 (person_name) (person_name) · axial · 0.53mm/px · z∈[-323,-199]mm · 5 of 94 slices shown, 7 images]
[im 16/94  soft-tissue]
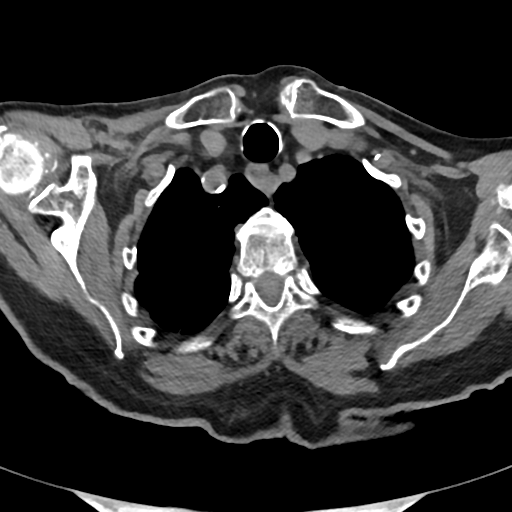
[im 16/94  bone]
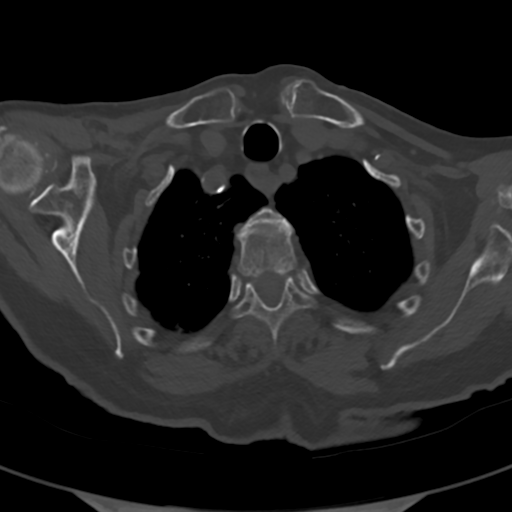
[im 32/94  bone]
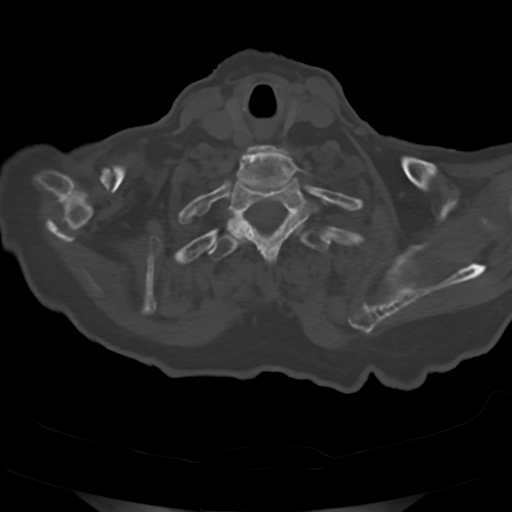
[im 47/94  bone]
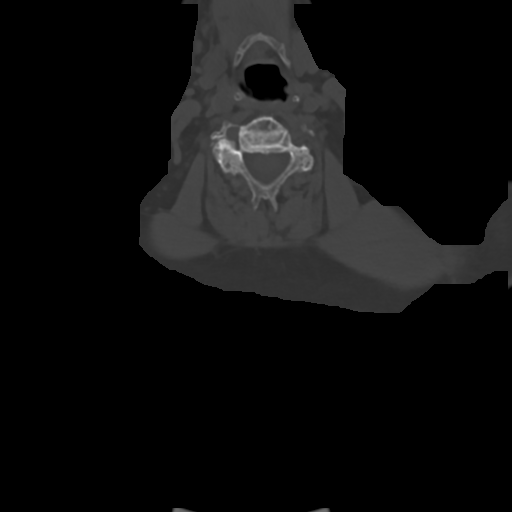
[im 63/94  bone]
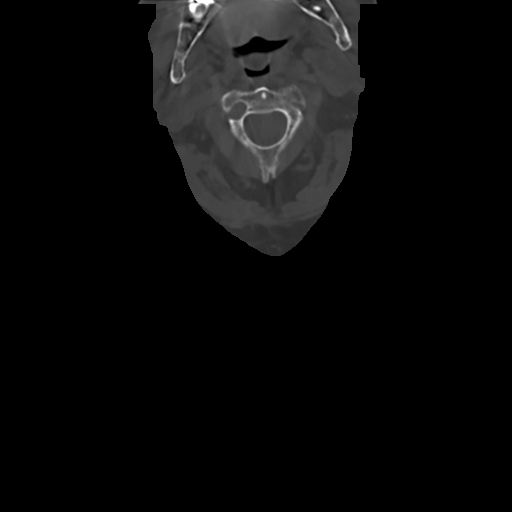
[im 78/94  soft-tissue]
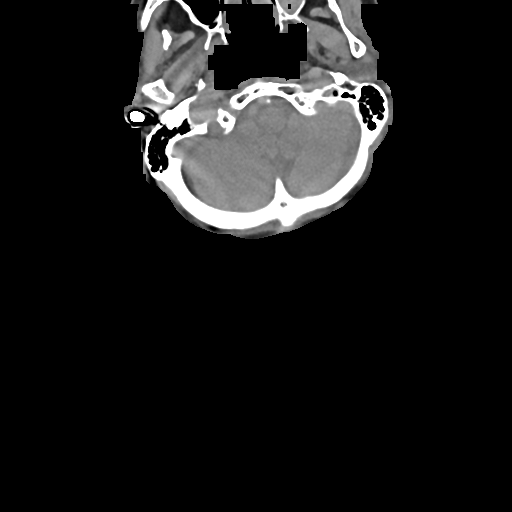
[im 78/94  bone]
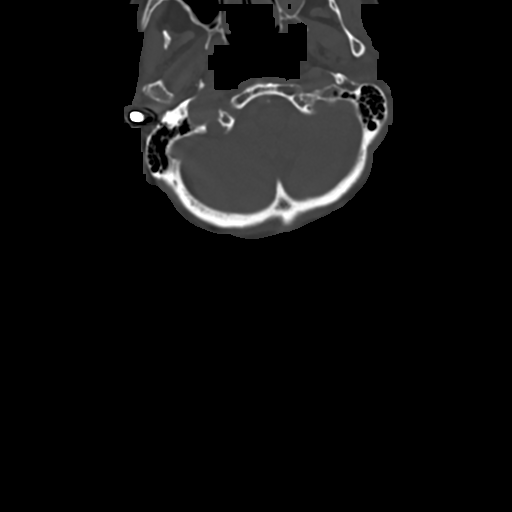

[Series 8: c_spine 2.0 sag bone · sagittal · 0.37mm/px · 5 of 61 slices shown, 6 images]
[im 21/61  bone]
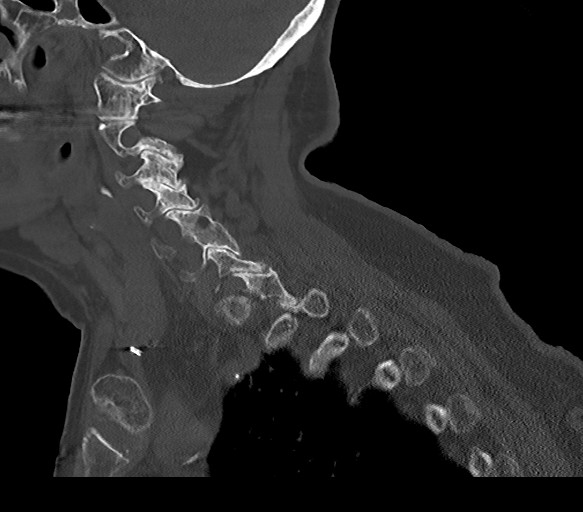
[im 26/61  bone]
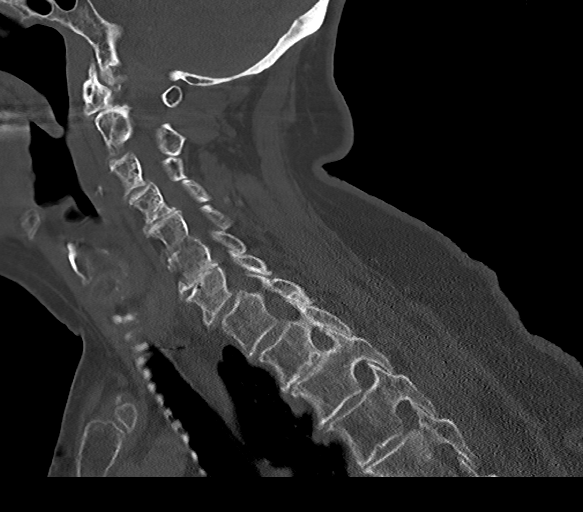
[im 31/61  soft-tissue]
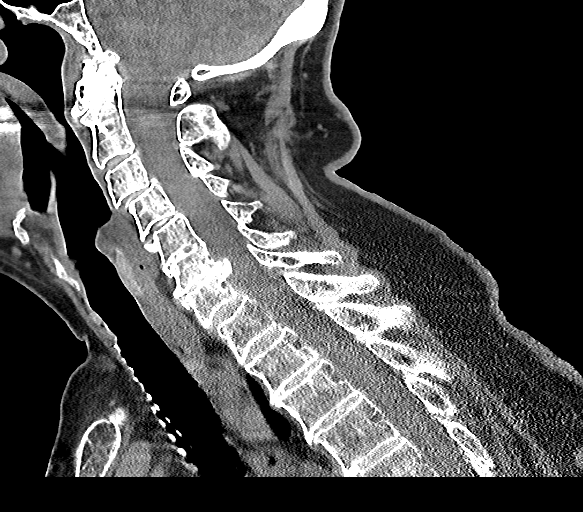
[im 31/61  bone]
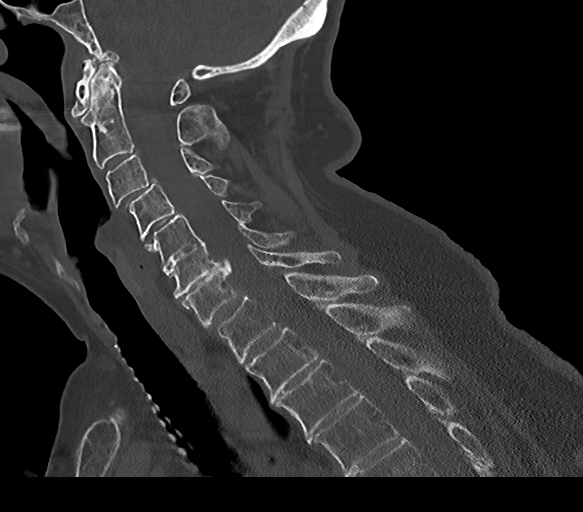
[im 36/61  bone]
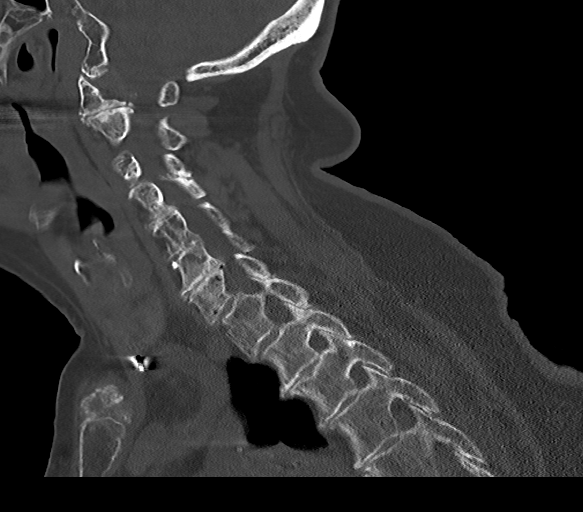
[im 41/61  bone]
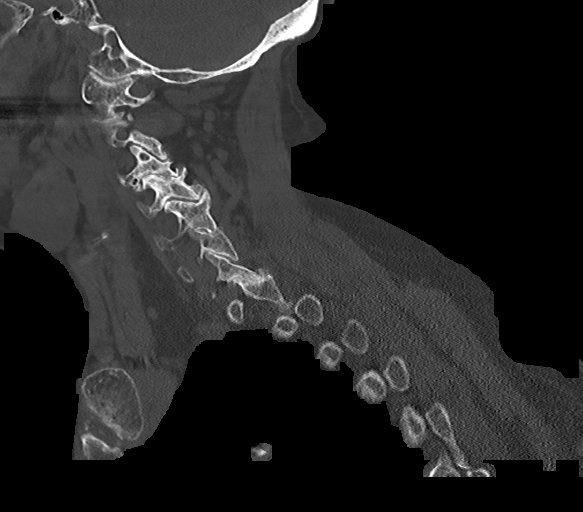

[Series 9: c_spine 2.0 cor bone · coronal · 0.27mm/px · 3 of 96 slices shown]
[im 20/96  bone]
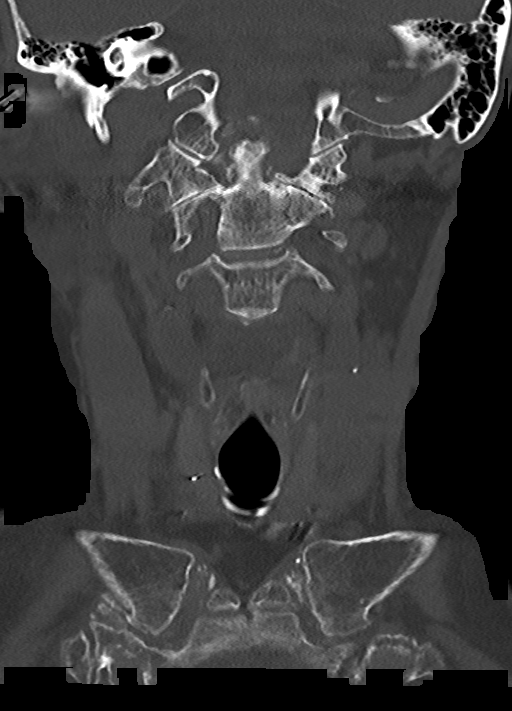
[im 39/96  bone]
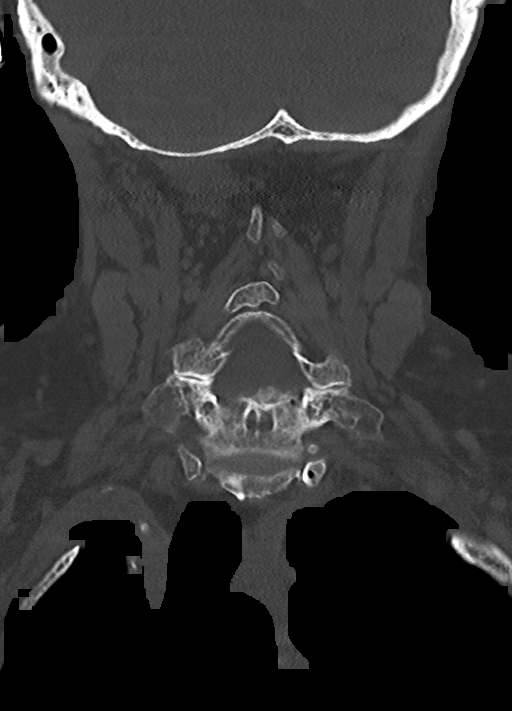
[im 58/96  bone]
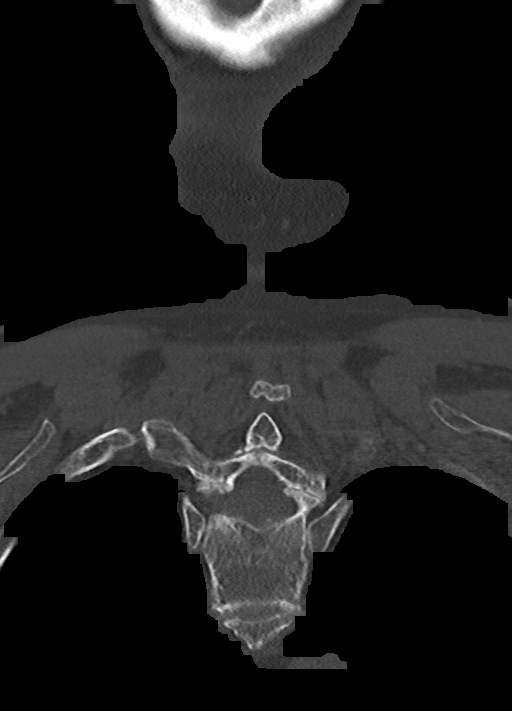

[13 of 33 positions shown; findings below may reference images not displayed]

FINDINGS: CT HEAD FINDINGS

Brain: No evidence of acute infarction, hemorrhage, hydrocephalus,
extra-axial collection or mass lesion/mass effect. Cerebral volume
loss in keeping with age. Mild for age chronic small vessel ischemic
change in the white matter.

Vascular: No hyperdense vessel or unexpected calcification.

Skull: Normal. Negative for fracture or focal lesion.

Sinuses/Orbits: Proteinaceous retention cysts in the inferior left
maxillary sinus. Bilateral cataract resection.

CT CERVICAL SPINE FINDINGS

Alignment: No traumatic malalignment. Mild C3-4 and C7-T1
anterolisthesis.

Skull base and vertebrae: Osteopenia. No acute cervical spine
fracture.

Soft tissues and spinal canal: No prevertebral fluid or swelling. No
visible canal hematoma.

Disc levels: Generalized degenerative disease most advanced at the
C6-7 disc space. Facet osteoarthritis is generalized, greatest at
C1-2 on the left.

Upper chest: Known right mid clavicle fracture.
IMPRESSION: No evidence of acute intracranial or cervical spine injury.

## 2020-05-28 DIAGNOSIS — Z20828 Contact with and (suspected) exposure to other viral communicable diseases: Secondary | ICD-10-CM | POA: Diagnosis not present

## 2020-05-28 DIAGNOSIS — Z1159 Encounter for screening for other viral diseases: Secondary | ICD-10-CM | POA: Diagnosis not present

## 2020-06-04 DIAGNOSIS — Z1159 Encounter for screening for other viral diseases: Secondary | ICD-10-CM | POA: Diagnosis not present

## 2020-06-04 DIAGNOSIS — Z20828 Contact with and (suspected) exposure to other viral communicable diseases: Secondary | ICD-10-CM | POA: Diagnosis not present

## 2020-06-11 DIAGNOSIS — Z20828 Contact with and (suspected) exposure to other viral communicable diseases: Secondary | ICD-10-CM | POA: Diagnosis not present

## 2020-06-11 DIAGNOSIS — Z1159 Encounter for screening for other viral diseases: Secondary | ICD-10-CM | POA: Diagnosis not present

## 2020-06-25 DIAGNOSIS — Z20828 Contact with and (suspected) exposure to other viral communicable diseases: Secondary | ICD-10-CM | POA: Diagnosis not present

## 2020-06-25 DIAGNOSIS — Z1159 Encounter for screening for other viral diseases: Secondary | ICD-10-CM | POA: Diagnosis not present

## 2020-07-09 DIAGNOSIS — Z1159 Encounter for screening for other viral diseases: Secondary | ICD-10-CM | POA: Diagnosis not present

## 2020-07-09 DIAGNOSIS — Z20828 Contact with and (suspected) exposure to other viral communicable diseases: Secondary | ICD-10-CM | POA: Diagnosis not present

## 2020-07-16 DIAGNOSIS — Z1159 Encounter for screening for other viral diseases: Secondary | ICD-10-CM | POA: Diagnosis not present

## 2020-07-16 DIAGNOSIS — Z20828 Contact with and (suspected) exposure to other viral communicable diseases: Secondary | ICD-10-CM | POA: Diagnosis not present

## 2020-07-23 DIAGNOSIS — Z20828 Contact with and (suspected) exposure to other viral communicable diseases: Secondary | ICD-10-CM | POA: Diagnosis not present

## 2020-07-23 DIAGNOSIS — Z1159 Encounter for screening for other viral diseases: Secondary | ICD-10-CM | POA: Diagnosis not present

## 2020-07-30 DIAGNOSIS — Z1159 Encounter for screening for other viral diseases: Secondary | ICD-10-CM | POA: Diagnosis not present

## 2020-07-30 DIAGNOSIS — Z20828 Contact with and (suspected) exposure to other viral communicable diseases: Secondary | ICD-10-CM | POA: Diagnosis not present

## 2020-08-06 DIAGNOSIS — Z20828 Contact with and (suspected) exposure to other viral communicable diseases: Secondary | ICD-10-CM | POA: Diagnosis not present

## 2020-08-06 DIAGNOSIS — Z1159 Encounter for screening for other viral diseases: Secondary | ICD-10-CM | POA: Diagnosis not present

## 2020-08-13 DIAGNOSIS — Z1159 Encounter for screening for other viral diseases: Secondary | ICD-10-CM | POA: Diagnosis not present

## 2020-08-13 DIAGNOSIS — Z20828 Contact with and (suspected) exposure to other viral communicable diseases: Secondary | ICD-10-CM | POA: Diagnosis not present

## 2020-08-20 DIAGNOSIS — Z1159 Encounter for screening for other viral diseases: Secondary | ICD-10-CM | POA: Diagnosis not present

## 2020-08-20 DIAGNOSIS — Z20828 Contact with and (suspected) exposure to other viral communicable diseases: Secondary | ICD-10-CM | POA: Diagnosis not present

## 2020-08-27 DIAGNOSIS — Z1159 Encounter for screening for other viral diseases: Secondary | ICD-10-CM | POA: Diagnosis not present

## 2020-08-27 DIAGNOSIS — Z20828 Contact with and (suspected) exposure to other viral communicable diseases: Secondary | ICD-10-CM | POA: Diagnosis not present

## 2020-09-10 DIAGNOSIS — Z20828 Contact with and (suspected) exposure to other viral communicable diseases: Secondary | ICD-10-CM | POA: Diagnosis not present

## 2020-09-10 DIAGNOSIS — Z1159 Encounter for screening for other viral diseases: Secondary | ICD-10-CM | POA: Diagnosis not present

## 2020-09-17 DIAGNOSIS — Z20828 Contact with and (suspected) exposure to other viral communicable diseases: Secondary | ICD-10-CM | POA: Diagnosis not present

## 2020-09-17 DIAGNOSIS — Z1159 Encounter for screening for other viral diseases: Secondary | ICD-10-CM | POA: Diagnosis not present

## 2020-09-24 DIAGNOSIS — Z20828 Contact with and (suspected) exposure to other viral communicable diseases: Secondary | ICD-10-CM | POA: Diagnosis not present

## 2020-09-24 DIAGNOSIS — Z1159 Encounter for screening for other viral diseases: Secondary | ICD-10-CM | POA: Diagnosis not present

## 2020-10-01 DIAGNOSIS — Z1159 Encounter for screening for other viral diseases: Secondary | ICD-10-CM | POA: Diagnosis not present

## 2020-10-01 DIAGNOSIS — Z20828 Contact with and (suspected) exposure to other viral communicable diseases: Secondary | ICD-10-CM | POA: Diagnosis not present

## 2020-10-08 DIAGNOSIS — Z1159 Encounter for screening for other viral diseases: Secondary | ICD-10-CM | POA: Diagnosis not present

## 2020-10-08 DIAGNOSIS — Z20828 Contact with and (suspected) exposure to other viral communicable diseases: Secondary | ICD-10-CM | POA: Diagnosis not present

## 2020-10-10 DIAGNOSIS — N1831 Chronic kidney disease, stage 3a: Secondary | ICD-10-CM | POA: Diagnosis not present

## 2020-10-10 DIAGNOSIS — F411 Generalized anxiety disorder: Secondary | ICD-10-CM | POA: Diagnosis not present

## 2020-10-10 DIAGNOSIS — Z79899 Other long term (current) drug therapy: Secondary | ICD-10-CM | POA: Diagnosis not present

## 2020-10-10 DIAGNOSIS — Z1389 Encounter for screening for other disorder: Secondary | ICD-10-CM | POA: Diagnosis not present

## 2020-10-10 DIAGNOSIS — Z0000 Encounter for general adult medical examination without abnormal findings: Secondary | ICD-10-CM | POA: Diagnosis not present

## 2020-10-10 DIAGNOSIS — I358 Other nonrheumatic aortic valve disorders: Secondary | ICD-10-CM | POA: Diagnosis not present

## 2020-10-10 DIAGNOSIS — F320 Major depressive disorder, single episode, mild: Secondary | ICD-10-CM | POA: Diagnosis not present

## 2020-10-10 DIAGNOSIS — I129 Hypertensive chronic kidney disease with stage 1 through stage 4 chronic kidney disease, or unspecified chronic kidney disease: Secondary | ICD-10-CM | POA: Diagnosis not present

## 2020-10-10 DIAGNOSIS — Z7189 Other specified counseling: Secondary | ICD-10-CM | POA: Diagnosis not present

## 2020-10-15 DIAGNOSIS — Z20828 Contact with and (suspected) exposure to other viral communicable diseases: Secondary | ICD-10-CM | POA: Diagnosis not present

## 2020-10-15 DIAGNOSIS — Z1159 Encounter for screening for other viral diseases: Secondary | ICD-10-CM | POA: Diagnosis not present

## 2020-10-22 DIAGNOSIS — Z1159 Encounter for screening for other viral diseases: Secondary | ICD-10-CM | POA: Diagnosis not present

## 2020-10-22 DIAGNOSIS — Z20828 Contact with and (suspected) exposure to other viral communicable diseases: Secondary | ICD-10-CM | POA: Diagnosis not present

## 2020-11-05 DIAGNOSIS — Z20828 Contact with and (suspected) exposure to other viral communicable diseases: Secondary | ICD-10-CM | POA: Diagnosis not present

## 2020-11-05 DIAGNOSIS — Z1159 Encounter for screening for other viral diseases: Secondary | ICD-10-CM | POA: Diagnosis not present

## 2020-11-13 DIAGNOSIS — J189 Pneumonia, unspecified organism: Secondary | ICD-10-CM | POA: Diagnosis not present

## 2020-11-13 DIAGNOSIS — R059 Cough, unspecified: Secondary | ICD-10-CM | POA: Diagnosis not present

## 2020-11-26 DIAGNOSIS — Z20828 Contact with and (suspected) exposure to other viral communicable diseases: Secondary | ICD-10-CM | POA: Diagnosis not present

## 2020-11-26 DIAGNOSIS — Z1159 Encounter for screening for other viral diseases: Secondary | ICD-10-CM | POA: Diagnosis not present

## 2020-12-03 DIAGNOSIS — Z1159 Encounter for screening for other viral diseases: Secondary | ICD-10-CM | POA: Diagnosis not present

## 2020-12-03 DIAGNOSIS — Z20828 Contact with and (suspected) exposure to other viral communicable diseases: Secondary | ICD-10-CM | POA: Diagnosis not present

## 2020-12-10 DIAGNOSIS — Z20828 Contact with and (suspected) exposure to other viral communicable diseases: Secondary | ICD-10-CM | POA: Diagnosis not present

## 2020-12-10 DIAGNOSIS — Z1159 Encounter for screening for other viral diseases: Secondary | ICD-10-CM | POA: Diagnosis not present

## 2020-12-17 DIAGNOSIS — Z1159 Encounter for screening for other viral diseases: Secondary | ICD-10-CM | POA: Diagnosis not present

## 2020-12-17 DIAGNOSIS — Z20828 Contact with and (suspected) exposure to other viral communicable diseases: Secondary | ICD-10-CM | POA: Diagnosis not present

## 2020-12-24 DIAGNOSIS — Z1159 Encounter for screening for other viral diseases: Secondary | ICD-10-CM | POA: Diagnosis not present

## 2020-12-24 DIAGNOSIS — Z20828 Contact with and (suspected) exposure to other viral communicable diseases: Secondary | ICD-10-CM | POA: Diagnosis not present

## 2021-01-07 DIAGNOSIS — Z1159 Encounter for screening for other viral diseases: Secondary | ICD-10-CM | POA: Diagnosis not present

## 2021-01-07 DIAGNOSIS — Z20828 Contact with and (suspected) exposure to other viral communicable diseases: Secondary | ICD-10-CM | POA: Diagnosis not present

## 2021-01-28 DIAGNOSIS — Z20828 Contact with and (suspected) exposure to other viral communicable diseases: Secondary | ICD-10-CM | POA: Diagnosis not present

## 2021-01-28 DIAGNOSIS — Z1159 Encounter for screening for other viral diseases: Secondary | ICD-10-CM | POA: Diagnosis not present

## 2021-02-11 DIAGNOSIS — Z1159 Encounter for screening for other viral diseases: Secondary | ICD-10-CM | POA: Diagnosis not present

## 2021-02-11 DIAGNOSIS — Z20828 Contact with and (suspected) exposure to other viral communicable diseases: Secondary | ICD-10-CM | POA: Diagnosis not present

## 2021-02-18 DIAGNOSIS — Z23 Encounter for immunization: Secondary | ICD-10-CM | POA: Diagnosis not present

## 2021-02-18 DIAGNOSIS — Z8616 Personal history of COVID-19: Secondary | ICD-10-CM | POA: Diagnosis not present

## 2021-02-25 DIAGNOSIS — Z20828 Contact with and (suspected) exposure to other viral communicable diseases: Secondary | ICD-10-CM | POA: Diagnosis not present

## 2021-03-04 DIAGNOSIS — Z8616 Personal history of COVID-19: Secondary | ICD-10-CM | POA: Diagnosis not present

## 2021-03-11 DIAGNOSIS — Z20828 Contact with and (suspected) exposure to other viral communicable diseases: Secondary | ICD-10-CM | POA: Diagnosis not present

## 2021-03-18 DIAGNOSIS — Z8616 Personal history of COVID-19: Secondary | ICD-10-CM | POA: Diagnosis not present

## 2021-03-25 DIAGNOSIS — Z8616 Personal history of COVID-19: Secondary | ICD-10-CM | POA: Diagnosis not present

## 2021-04-01 DIAGNOSIS — Z8616 Personal history of COVID-19: Secondary | ICD-10-CM | POA: Diagnosis not present

## 2021-04-08 DIAGNOSIS — Z20828 Contact with and (suspected) exposure to other viral communicable diseases: Secondary | ICD-10-CM | POA: Diagnosis not present

## 2021-04-15 DIAGNOSIS — Z20828 Contact with and (suspected) exposure to other viral communicable diseases: Secondary | ICD-10-CM | POA: Diagnosis not present

## 2021-04-18 DIAGNOSIS — I129 Hypertensive chronic kidney disease with stage 1 through stage 4 chronic kidney disease, or unspecified chronic kidney disease: Secondary | ICD-10-CM | POA: Diagnosis not present

## 2021-04-18 DIAGNOSIS — N1831 Chronic kidney disease, stage 3a: Secondary | ICD-10-CM | POA: Diagnosis not present

## 2021-04-22 DIAGNOSIS — Z8616 Personal history of COVID-19: Secondary | ICD-10-CM | POA: Diagnosis not present

## 2021-04-29 DIAGNOSIS — Z8616 Personal history of COVID-19: Secondary | ICD-10-CM | POA: Diagnosis not present

## 2021-05-06 DIAGNOSIS — Z8616 Personal history of COVID-19: Secondary | ICD-10-CM | POA: Diagnosis not present

## 2021-05-13 DIAGNOSIS — Z8616 Personal history of COVID-19: Secondary | ICD-10-CM | POA: Diagnosis not present

## 2021-05-15 DIAGNOSIS — T161XXA Foreign body in right ear, initial encounter: Secondary | ICD-10-CM | POA: Diagnosis not present

## 2021-05-20 DIAGNOSIS — Z20822 Contact with and (suspected) exposure to covid-19: Secondary | ICD-10-CM | POA: Diagnosis not present

## 2021-05-27 DIAGNOSIS — Z20828 Contact with and (suspected) exposure to other viral communicable diseases: Secondary | ICD-10-CM | POA: Diagnosis not present

## 2021-06-03 DIAGNOSIS — Z20822 Contact with and (suspected) exposure to covid-19: Secondary | ICD-10-CM | POA: Diagnosis not present

## 2021-06-10 DIAGNOSIS — Z1159 Encounter for screening for other viral diseases: Secondary | ICD-10-CM | POA: Diagnosis not present

## 2021-06-10 DIAGNOSIS — Z20828 Contact with and (suspected) exposure to other viral communicable diseases: Secondary | ICD-10-CM | POA: Diagnosis not present

## 2021-06-17 DIAGNOSIS — Z20828 Contact with and (suspected) exposure to other viral communicable diseases: Secondary | ICD-10-CM | POA: Diagnosis not present

## 2021-06-17 DIAGNOSIS — Z1159 Encounter for screening for other viral diseases: Secondary | ICD-10-CM | POA: Diagnosis not present

## 2021-06-24 DIAGNOSIS — Z20828 Contact with and (suspected) exposure to other viral communicable diseases: Secondary | ICD-10-CM | POA: Diagnosis not present

## 2021-06-24 DIAGNOSIS — Z1159 Encounter for screening for other viral diseases: Secondary | ICD-10-CM | POA: Diagnosis not present

## 2021-06-26 DIAGNOSIS — Z1159 Encounter for screening for other viral diseases: Secondary | ICD-10-CM | POA: Diagnosis not present

## 2021-06-26 DIAGNOSIS — Z20828 Contact with and (suspected) exposure to other viral communicable diseases: Secondary | ICD-10-CM | POA: Diagnosis not present

## 2021-07-01 DIAGNOSIS — Z1159 Encounter for screening for other viral diseases: Secondary | ICD-10-CM | POA: Diagnosis not present

## 2021-07-01 DIAGNOSIS — Z20828 Contact with and (suspected) exposure to other viral communicable diseases: Secondary | ICD-10-CM | POA: Diagnosis not present

## 2021-07-03 DIAGNOSIS — Z20828 Contact with and (suspected) exposure to other viral communicable diseases: Secondary | ICD-10-CM | POA: Diagnosis not present

## 2021-07-03 DIAGNOSIS — Z1159 Encounter for screening for other viral diseases: Secondary | ICD-10-CM | POA: Diagnosis not present

## 2021-07-15 DIAGNOSIS — Z20822 Contact with and (suspected) exposure to covid-19: Secondary | ICD-10-CM | POA: Diagnosis not present

## 2021-07-22 DIAGNOSIS — Z20828 Contact with and (suspected) exposure to other viral communicable diseases: Secondary | ICD-10-CM | POA: Diagnosis not present

## 2021-07-29 DIAGNOSIS — Z20822 Contact with and (suspected) exposure to covid-19: Secondary | ICD-10-CM | POA: Diagnosis not present

## 2021-08-05 DIAGNOSIS — Z20822 Contact with and (suspected) exposure to covid-19: Secondary | ICD-10-CM | POA: Diagnosis not present

## 2021-08-12 DIAGNOSIS — Z20822 Contact with and (suspected) exposure to covid-19: Secondary | ICD-10-CM | POA: Diagnosis not present

## 2021-10-23 DIAGNOSIS — N1831 Chronic kidney disease, stage 3a: Secondary | ICD-10-CM | POA: Diagnosis not present

## 2021-10-23 DIAGNOSIS — I129 Hypertensive chronic kidney disease with stage 1 through stage 4 chronic kidney disease, or unspecified chronic kidney disease: Secondary | ICD-10-CM | POA: Diagnosis not present

## 2021-10-23 DIAGNOSIS — E213 Hyperparathyroidism, unspecified: Secondary | ICD-10-CM | POA: Diagnosis not present

## 2021-10-23 DIAGNOSIS — Z0000 Encounter for general adult medical examination without abnormal findings: Secondary | ICD-10-CM | POA: Diagnosis not present

## 2021-10-23 DIAGNOSIS — I7090 Unspecified atherosclerosis: Secondary | ICD-10-CM | POA: Diagnosis not present

## 2021-10-23 DIAGNOSIS — Z79899 Other long term (current) drug therapy: Secondary | ICD-10-CM | POA: Diagnosis not present

## 2021-11-15 DIAGNOSIS — Z20822 Contact with and (suspected) exposure to covid-19: Secondary | ICD-10-CM | POA: Diagnosis not present

## 2022-03-11 DIAGNOSIS — H6121 Impacted cerumen, right ear: Secondary | ICD-10-CM | POA: Diagnosis not present

## 2022-04-30 DIAGNOSIS — N1831 Chronic kidney disease, stage 3a: Secondary | ICD-10-CM | POA: Diagnosis not present

## 2022-04-30 DIAGNOSIS — I129 Hypertensive chronic kidney disease with stage 1 through stage 4 chronic kidney disease, or unspecified chronic kidney disease: Secondary | ICD-10-CM | POA: Diagnosis not present

## 2022-11-04 DIAGNOSIS — I7090 Unspecified atherosclerosis: Secondary | ICD-10-CM | POA: Diagnosis not present

## 2022-11-04 DIAGNOSIS — Z0000 Encounter for general adult medical examination without abnormal findings: Secondary | ICD-10-CM | POA: Diagnosis not present

## 2022-11-04 DIAGNOSIS — I129 Hypertensive chronic kidney disease with stage 1 through stage 4 chronic kidney disease, or unspecified chronic kidney disease: Secondary | ICD-10-CM | POA: Diagnosis not present

## 2022-11-04 DIAGNOSIS — E213 Hyperparathyroidism, unspecified: Secondary | ICD-10-CM | POA: Diagnosis not present

## 2022-11-04 DIAGNOSIS — N1831 Chronic kidney disease, stage 3a: Secondary | ICD-10-CM | POA: Diagnosis not present

## 2022-11-04 DIAGNOSIS — Z79899 Other long term (current) drug therapy: Secondary | ICD-10-CM | POA: Diagnosis not present

## 2023-01-17 DIAGNOSIS — B372 Candidiasis of skin and nail: Secondary | ICD-10-CM | POA: Diagnosis not present

## 2023-01-25 DIAGNOSIS — N644 Mastodynia: Secondary | ICD-10-CM | POA: Diagnosis not present

## 2023-01-25 DIAGNOSIS — R21 Rash and other nonspecific skin eruption: Secondary | ICD-10-CM | POA: Diagnosis not present

## 2023-02-16 DIAGNOSIS — N644 Mastodynia: Secondary | ICD-10-CM | POA: Diagnosis not present

## 2023-03-03 DIAGNOSIS — N644 Mastodynia: Secondary | ICD-10-CM | POA: Diagnosis not present

## 2023-05-07 DIAGNOSIS — I129 Hypertensive chronic kidney disease with stage 1 through stage 4 chronic kidney disease, or unspecified chronic kidney disease: Secondary | ICD-10-CM | POA: Diagnosis not present

## 2023-05-07 DIAGNOSIS — N1831 Chronic kidney disease, stage 3a: Secondary | ICD-10-CM | POA: Diagnosis not present

## 2023-05-07 DIAGNOSIS — F3341 Major depressive disorder, recurrent, in partial remission: Secondary | ICD-10-CM | POA: Diagnosis not present

## 2023-07-28 DIAGNOSIS — H6122 Impacted cerumen, left ear: Secondary | ICD-10-CM | POA: Diagnosis not present

## 2023-09-12 ENCOUNTER — Other Ambulatory Visit: Payer: Self-pay

## 2023-09-12 ENCOUNTER — Emergency Department (HOSPITAL_COMMUNITY)

## 2023-09-12 ENCOUNTER — Inpatient Hospital Stay (HOSPITAL_COMMUNITY)

## 2023-09-12 ENCOUNTER — Encounter (HOSPITAL_COMMUNITY): Payer: Self-pay

## 2023-09-12 ENCOUNTER — Inpatient Hospital Stay (HOSPITAL_COMMUNITY)
Admission: EM | Admit: 2023-09-12 | Discharge: 2023-10-13 | DRG: 871 | Disposition: E | Attending: Pulmonary Disease | Admitting: Pulmonary Disease

## 2023-09-12 DIAGNOSIS — I10 Essential (primary) hypertension: Secondary | ICD-10-CM | POA: Diagnosis present

## 2023-09-12 DIAGNOSIS — E870 Hyperosmolality and hypernatremia: Secondary | ICD-10-CM | POA: Diagnosis present

## 2023-09-12 DIAGNOSIS — J189 Pneumonia, unspecified organism: Secondary | ICD-10-CM | POA: Diagnosis present

## 2023-09-12 DIAGNOSIS — R739 Hyperglycemia, unspecified: Secondary | ICD-10-CM | POA: Diagnosis not present

## 2023-09-12 DIAGNOSIS — E876 Hypokalemia: Secondary | ICD-10-CM | POA: Diagnosis not present

## 2023-09-12 DIAGNOSIS — Z66 Do not resuscitate: Secondary | ICD-10-CM | POA: Diagnosis present

## 2023-09-12 DIAGNOSIS — A419 Sepsis, unspecified organism: Secondary | ICD-10-CM | POA: Diagnosis not present

## 2023-09-12 DIAGNOSIS — N179 Acute kidney failure, unspecified: Secondary | ICD-10-CM | POA: Diagnosis not present

## 2023-09-12 DIAGNOSIS — E8721 Acute metabolic acidosis: Secondary | ICD-10-CM

## 2023-09-12 DIAGNOSIS — Z1152 Encounter for screening for COVID-19: Secondary | ICD-10-CM

## 2023-09-12 DIAGNOSIS — R1084 Generalized abdominal pain: Secondary | ICD-10-CM

## 2023-09-12 DIAGNOSIS — R6521 Severe sepsis with septic shock: Secondary | ICD-10-CM | POA: Diagnosis not present

## 2023-09-12 DIAGNOSIS — Z0389 Encounter for observation for other suspected diseases and conditions ruled out: Secondary | ICD-10-CM | POA: Diagnosis not present

## 2023-09-12 DIAGNOSIS — M810 Age-related osteoporosis without current pathological fracture: Secondary | ICD-10-CM | POA: Diagnosis present

## 2023-09-12 DIAGNOSIS — J9811 Atelectasis: Secondary | ICD-10-CM | POA: Diagnosis present

## 2023-09-12 DIAGNOSIS — Z881 Allergy status to other antibiotic agents status: Secondary | ICD-10-CM | POA: Diagnosis not present

## 2023-09-12 DIAGNOSIS — Z79899 Other long term (current) drug therapy: Secondary | ICD-10-CM | POA: Diagnosis not present

## 2023-09-12 DIAGNOSIS — R7989 Other specified abnormal findings of blood chemistry: Secondary | ICD-10-CM

## 2023-09-12 DIAGNOSIS — J9601 Acute respiratory failure with hypoxia: Secondary | ICD-10-CM | POA: Diagnosis present

## 2023-09-12 DIAGNOSIS — Z515 Encounter for palliative care: Secondary | ICD-10-CM

## 2023-09-12 DIAGNOSIS — G9341 Metabolic encephalopathy: Secondary | ICD-10-CM | POA: Diagnosis present

## 2023-09-12 DIAGNOSIS — E871 Hypo-osmolality and hyponatremia: Secondary | ICD-10-CM | POA: Diagnosis not present

## 2023-09-12 DIAGNOSIS — E872 Acidosis, unspecified: Secondary | ICD-10-CM | POA: Diagnosis present

## 2023-09-12 DIAGNOSIS — Z96641 Presence of right artificial hip joint: Secondary | ICD-10-CM | POA: Diagnosis present

## 2023-09-12 DIAGNOSIS — R55 Syncope and collapse: Secondary | ICD-10-CM | POA: Diagnosis not present

## 2023-09-12 DIAGNOSIS — Z7982 Long term (current) use of aspirin: Secondary | ICD-10-CM | POA: Diagnosis not present

## 2023-09-12 DIAGNOSIS — R000 Tachycardia, unspecified: Secondary | ICD-10-CM | POA: Diagnosis not present

## 2023-09-12 DIAGNOSIS — Z880 Allergy status to penicillin: Secondary | ICD-10-CM | POA: Diagnosis not present

## 2023-09-12 DIAGNOSIS — R54 Age-related physical debility: Secondary | ICD-10-CM | POA: Diagnosis present

## 2023-09-12 DIAGNOSIS — N170 Acute kidney failure with tubular necrosis: Secondary | ICD-10-CM | POA: Diagnosis present

## 2023-09-12 DIAGNOSIS — R0689 Other abnormalities of breathing: Secondary | ICD-10-CM | POA: Diagnosis not present

## 2023-09-12 DIAGNOSIS — W19XXXA Unspecified fall, initial encounter: Secondary | ICD-10-CM | POA: Diagnosis not present

## 2023-09-12 DIAGNOSIS — E785 Hyperlipidemia, unspecified: Secondary | ICD-10-CM | POA: Diagnosis present

## 2023-09-12 DIAGNOSIS — Z8249 Family history of ischemic heart disease and other diseases of the circulatory system: Secondary | ICD-10-CM | POA: Diagnosis not present

## 2023-09-12 DIAGNOSIS — I700 Atherosclerosis of aorta: Secondary | ICD-10-CM | POA: Diagnosis not present

## 2023-09-12 DIAGNOSIS — R509 Fever, unspecified: Principal | ICD-10-CM

## 2023-09-12 DIAGNOSIS — F419 Anxiety disorder, unspecified: Secondary | ICD-10-CM | POA: Diagnosis present

## 2023-09-12 LAB — URINALYSIS, W/ REFLEX TO CULTURE (INFECTION SUSPECTED)
Bilirubin Urine: NEGATIVE
Glucose, UA: 50 mg/dL — AB
Ketones, ur: NEGATIVE mg/dL
Leukocytes,Ua: NEGATIVE
Nitrite: NEGATIVE
Protein, ur: 100 mg/dL — AB
Specific Gravity, Urine: 1.013 (ref 1.005–1.030)
pH: 5 (ref 5.0–8.0)

## 2023-09-12 LAB — CBC WITH DIFFERENTIAL/PLATELET
Abs Immature Granulocytes: 0 10*3/uL (ref 0.00–0.07)
Basophils Absolute: 0 10*3/uL (ref 0.0–0.1)
Basophils Relative: 0 %
Eosinophils Absolute: 0 10*3/uL (ref 0.0–0.5)
Eosinophils Relative: 1 %
HCT: 50.7 % — ABNORMAL HIGH (ref 36.0–46.0)
Hemoglobin: 16.2 g/dL — ABNORMAL HIGH (ref 12.0–15.0)
Lymphocytes Relative: 16 %
Lymphs Abs: 0.7 10*3/uL (ref 0.7–4.0)
MCH: 32.3 pg (ref 26.0–34.0)
MCHC: 32 g/dL (ref 30.0–36.0)
MCV: 101 fL — ABNORMAL HIGH (ref 80.0–100.0)
Monocytes Absolute: 0 10*3/uL — ABNORMAL LOW (ref 0.1–1.0)
Monocytes Relative: 1 %
Neutro Abs: 3.7 10*3/uL (ref 1.7–7.7)
Neutrophils Relative %: 82 %
Platelets: 157 10*3/uL (ref 150–400)
RBC: 5.02 MIL/uL (ref 3.87–5.11)
RDW: 13.4 % (ref 11.5–15.5)
WBC: 4.5 10*3/uL (ref 4.0–10.5)
nRBC: 0 /100{WBCs}
nRBC: 0.7 % — ABNORMAL HIGH (ref 0.0–0.2)

## 2023-09-12 LAB — COMPREHENSIVE METABOLIC PANEL
ALT: 21 U/L (ref 0–44)
AST: 49 U/L — ABNORMAL HIGH (ref 15–41)
Albumin: 2.6 g/dL — ABNORMAL LOW (ref 3.5–5.0)
Alkaline Phosphatase: 41 U/L (ref 38–126)
Anion gap: 28 — ABNORMAL HIGH (ref 5–15)
BUN: 35 mg/dL — ABNORMAL HIGH (ref 8–23)
CO2: 14 mmol/L — ABNORMAL LOW (ref 22–32)
Calcium: 9.4 mg/dL (ref 8.9–10.3)
Chloride: 104 mmol/L (ref 98–111)
Creatinine, Ser: 2.22 mg/dL — ABNORMAL HIGH (ref 0.44–1.00)
GFR, Estimated: 20 mL/min — ABNORMAL LOW (ref >60)
Glucose, Bld: 286 mg/dL — ABNORMAL HIGH (ref 70–99)
Potassium: 3.3 mmol/L — ABNORMAL LOW (ref 3.5–5.1)
Sodium: 146 mmol/L — ABNORMAL HIGH (ref 135–145)
Total Bilirubin: 0.9 mg/dL (ref 0.0–1.2)
Total Protein: 4.6 g/dL — ABNORMAL LOW (ref 6.5–8.1)

## 2023-09-12 LAB — I-STAT VENOUS BLOOD GAS, ED
Acid-base deficit: 18 mmol/L — ABNORMAL HIGH (ref 0.0–2.0)
Bicarbonate: 12.8 mmol/L — ABNORMAL LOW (ref 20.0–28.0)
Calcium, Ion: 1.16 mmol/L (ref 1.15–1.40)
HCT: 40 % (ref 36.0–46.0)
Hemoglobin: 13.6 g/dL (ref 12.0–15.0)
O2 Saturation: 93 %
Potassium: 2.8 mmol/L — ABNORMAL LOW (ref 3.5–5.1)
Sodium: 141 mmol/L (ref 135–145)
TCO2: 14 mmol/L — ABNORMAL LOW (ref 22–32)
pCO2, Ven: 49.7 mmHg (ref 44–60)
pH, Ven: 7.02 — CL (ref 7.25–7.43)
pO2, Ven: 98 mmHg — ABNORMAL HIGH (ref 32–45)

## 2023-09-12 LAB — PROCALCITONIN: Procalcitonin: 93.71 ng/mL

## 2023-09-12 LAB — LIPASE, BLOOD: Lipase: 218 U/L — ABNORMAL HIGH (ref 11–51)

## 2023-09-12 LAB — I-STAT CHEM 8, ED
BUN: 31 mg/dL — ABNORMAL HIGH (ref 8–23)
Calcium, Ion: 1.16 mmol/L (ref 1.15–1.40)
Chloride: 104 mmol/L (ref 98–111)
Creatinine, Ser: 2 mg/dL — ABNORMAL HIGH (ref 0.44–1.00)
Glucose, Bld: 242 mg/dL — ABNORMAL HIGH (ref 70–99)
HCT: 39 % (ref 36.0–46.0)
Hemoglobin: 13.3 g/dL (ref 12.0–15.0)
Potassium: 2.8 mmol/L — ABNORMAL LOW (ref 3.5–5.1)
Sodium: 140 mmol/L (ref 135–145)
TCO2: 15 mmol/L — ABNORMAL LOW (ref 22–32)

## 2023-09-12 LAB — RESP PANEL BY RT-PCR (RSV, FLU A&B, COVID)  RVPGX2
Influenza A by PCR: NEGATIVE
Influenza B by PCR: NEGATIVE
Resp Syncytial Virus by PCR: NEGATIVE
SARS Coronavirus 2 by RT PCR: NEGATIVE

## 2023-09-12 LAB — CBG MONITORING, ED: Glucose-Capillary: 279 mg/dL — ABNORMAL HIGH (ref 70–99)

## 2023-09-12 LAB — CREATININE, SERUM
Creatinine, Ser: 2.15 mg/dL — ABNORMAL HIGH (ref 0.44–1.00)
GFR, Estimated: 20 mL/min — ABNORMAL LOW (ref >60)

## 2023-09-12 LAB — I-STAT CG4 LACTIC ACID, ED: Lactic Acid, Venous: >15 mmol/L (ref 0.5–1.9)

## 2023-09-12 LAB — CK TOTAL AND CKMB (NOT AT ARMC)
CK, MB: 48.8 ng/mL — ABNORMAL HIGH (ref 0.5–5.0)
Total CK: 2033 U/L — ABNORMAL HIGH (ref 38–234)

## 2023-09-12 LAB — GLUCOSE, CAPILLARY
Glucose-Capillary: 173 mg/dL — ABNORMAL HIGH (ref 70–99)
Glucose-Capillary: 69 mg/dL — ABNORMAL LOW (ref 70–99)

## 2023-09-12 LAB — MAGNESIUM: Magnesium: 2.1 mg/dL (ref 1.7–2.4)

## 2023-09-12 MED ORDER — LACTATED RINGERS IV BOLUS (SEPSIS)
1000.0000 mL | Freq: Once | INTRAVENOUS | Status: AC
  Administered 2023-09-12: 1000 mL via INTRAVENOUS

## 2023-09-12 MED ORDER — HEPARIN SODIUM (PORCINE) 5000 UNIT/ML IJ SOLN
5000.0000 [IU] | Freq: Three times a day (TID) | INTRAMUSCULAR | Status: DC
  Administered 2023-09-12: 5000 [IU] via SUBCUTANEOUS
  Filled 2023-09-12: qty 1

## 2023-09-12 MED ORDER — SODIUM CHLORIDE 0.9 % IV SOLN
2.0000 g | Freq: Once | INTRAVENOUS | Status: AC
  Administered 2023-09-12: 2 g via INTRAVENOUS
  Filled 2023-09-12: qty 12.5

## 2023-09-12 MED ORDER — DOCUSATE SODIUM 100 MG PO CAPS: 100.0000 mg | ORAL_CAPSULE | Freq: Two times a day (BID) | ORAL | Status: DC | PRN

## 2023-09-12 MED ORDER — LACTATED RINGERS IV SOLN: INTRAVENOUS | Status: DC

## 2023-09-12 MED ORDER — VANCOMYCIN VARIABLE DOSE PER UNSTABLE RENAL FUNCTION (PHARMACIST DOSING): Status: DC

## 2023-09-12 MED ORDER — VANCOMYCIN HCL IN DEXTROSE 1-5 GM/200ML-% IV SOLN
1000.0000 mg | Freq: Once | INTRAVENOUS | Status: AC
  Administered 2023-09-12: 1000 mg via INTRAVENOUS
  Filled 2023-09-12: qty 200

## 2023-09-12 MED ORDER — LORAZEPAM 2 MG/ML IJ SOLN
0.5000 mg | Freq: Four times a day (QID) | INTRAMUSCULAR | Status: DC | PRN
  Administered 2023-09-12: 0.5 mg via INTRAVENOUS
  Filled 2023-09-12: qty 1

## 2023-09-12 MED ORDER — SODIUM BICARBONATE 8.4 % IV SOLN
50.0000 meq | Freq: Once | INTRAVENOUS | Status: AC
  Administered 2023-09-12: 50 meq via INTRAVENOUS
  Filled 2023-09-12: qty 50

## 2023-09-12 MED ORDER — METRONIDAZOLE 500 MG/100ML IV SOLN
500.0000 mg | Freq: Once | INTRAVENOUS | Status: AC
  Administered 2023-09-12: 500 mg via INTRAVENOUS
  Filled 2023-09-12: qty 100

## 2023-09-12 MED ORDER — POTASSIUM CHLORIDE 10 MEQ/100ML IV SOLN
10.0000 meq | INTRAVENOUS | Status: AC
  Filled 2023-09-12: qty 100

## 2023-09-12 MED ORDER — ACETAMINOPHEN 650 MG RE SUPP
650.0000 mg | Freq: Once | RECTAL | Status: AC
  Administered 2023-09-12: 650 mg via RECTAL
  Filled 2023-09-12: qty 1

## 2023-09-12 MED ORDER — NOREPINEPHRINE 4 MG/250ML-% IV SOLN
2.0000 ug/min | INTRAVENOUS | Status: DC
  Administered 2023-09-12: 15 ug/min via INTRAVENOUS
  Filled 2023-09-12: qty 500

## 2023-09-12 MED ORDER — POLYETHYLENE GLYCOL 3350 17 G PO PACK: 17.0000 g | PACK | Freq: Every day | ORAL | Status: DC | PRN

## 2023-09-12 MED ORDER — STERILE WATER FOR INJECTION IV SOLN
INTRAVENOUS | Status: DC
  Filled 2023-09-12: qty 1000

## 2023-09-12 MED ORDER — LORAZEPAM 2 MG/ML IJ SOLN
0.5000 mg | Freq: Once | INTRAMUSCULAR | Status: AC
  Administered 2023-09-12: 0.5 mg via INTRAVENOUS
  Filled 2023-09-12: qty 1

## 2023-09-12 MED ORDER — SODIUM BICARBONATE 8.4 % IV SOLN
INTRAVENOUS | Status: AC
  Filled 2023-09-12: qty 50

## 2023-09-12 MED ORDER — POTASSIUM CHLORIDE 10 MEQ/100ML IV SOLN
10.0000 meq | INTRAVENOUS | Status: AC
  Administered 2023-09-12 (×4): 10 meq via INTRAVENOUS
  Filled 2023-09-12 (×3): qty 100

## 2023-09-12 MED ORDER — NOREPINEPHRINE 4 MG/250ML-% IV SOLN
INTRAVENOUS | Status: AC
  Administered 2023-09-12: 2 ug/min via INTRAVENOUS
  Filled 2023-09-12: qty 250

## 2023-09-12 MED ORDER — SODIUM CHLORIDE 0.9 % IV SOLN: 250.0000 mL | INTRAVENOUS | Status: DC

## 2023-09-12 MED ORDER — PIPERACILLIN-TAZOBACTAM IN DEX 2-0.25 GM/50ML IV SOLN: 2.2500 g | Freq: Three times a day (TID) | INTRAVENOUS | Status: DC

## 2023-09-12 MED ORDER — DEXTROSE 50 % IV SOLN
12.5000 g | Freq: Once | INTRAVENOUS | Status: AC
  Administered 2023-09-12: 12.5 g via INTRAVENOUS
  Filled 2023-09-12: qty 50

## 2023-09-12 NOTE — Progress Notes (Signed)
 Patient transferred from ED to 3M03. VSS on Levophed infusion.

## 2023-09-12 NOTE — Sepsis Progress Note (Signed)
 Communication took place with current BSRN who confirms lab will be drawing the 2nd lactic acid

## 2023-09-12 NOTE — ED Provider Notes (Signed)
  Physical Exam  BP (!) 77/62   Pulse (!) 118   Temp (!) 102.7 F (39.3 C) (Rectal)   Resp (!) 41   SpO2 97%   Physical Exam  Procedures  Procedures  ED Course / MDM    Medical Decision Making Care assumed at 3 PM.  Patient is here with altered mental status and fever and tachycardia and hypotension.  Code sepsis was initiated.  Unclear source so broad-spectrum antibiotics were ordered.  Patient has a limited code and signout pending labs and admission  4 pm I reviewed patient's labs and pH is 7.0.  Patient is put on BiPAP.  Also lactate is greater than 15.  The daughter, who is the healthcare proxy, is at bedside.  We discussed in detail about CODE STATUS.  Patient is a limited code.  They do not want her intubated or getting chest compressions.  However they want pressors and IV fluids and antibiotics.  Patient does have an AKI and no clear pneumonia or flu or RSV.  I ordered CT chest abdomen pelvis to further assess the source.  Patient's blood pressure also dropped down to the 70s after 30 cc/kg bolus.  I have ordered Levophed  5:02 PM Patient remains hypotensive on Levophed and BiPAP.  Consulted ICU to see patient  7 pm Patient went up to ICU. CT pending. Remained in critical condition   CRITICAL CARE Performed by: Richardean Canal   Total critical care time: 45 minutes  Critical care time was exclusive of separately billable procedures and treating other patients.  Critical care was necessary to treat or prevent imminent or life-threatening deterioration.  Critical care was time spent personally by me on the following activities: development of treatment plan with patient and/or surrogate as well as nursing, discussions with consultants, evaluation of patient's response to treatment, examination of patient, obtaining history from patient or surrogate, ordering and performing treatments and interventions, ordering and review of laboratory studies, ordering and review of  radiographic studies, pulse oximetry and re-evaluation of patient's condition.    Amount and/or Complexity of Data Reviewed Labs: ordered. Radiology: ordered and independent interpretation performed. Decision-making details documented in ED Course.  Risk OTC drugs. Prescription drug management. Decision regarding hospitalization.          Charlynne Pander, MD 09/12/23 [phone]

## 2023-09-12 NOTE — ED Triage Notes (Signed)
 Pt bib ems from [name]. Pt found her unresponsive in the floor. Ems had to assisted ventilations upon arrival with carotid pulse in the 50s. SBP initially in the 60s. Pt arrives to ED on NRB, last Bp 80/40, pt given fluid PTA.  Pt also has some abd distention.

## 2023-09-12 NOTE — Progress Notes (Signed)
 Pharmacy Antibiotic Note  Jessica Bruce is a 89 y.o. female for which pharmacy has been consulted for vancomycin and zosyn dosing for sepsis.  Patient with a history of HTN, HLD, anxiety, osteoporosis. Patient presenting with AMS.  SCr 2.22>>2 -- AKI WBC pending; LA >15; T 102.7>99.2; HR 93; RR 38>23 COVID neg / flu neg  Plan: Zosyn 2.25g q8h Vancomycin 1000mg  once - repeat per level unless renal function stable Monitor WBC, fever, renal function, cultures De-escalate when able     Temp (24hrs), Avg:98 F (36.7 C), Min:96 F (35.6 C), Max:102.7 F (39.3 C)  Recent Labs  Lab 09/12/23 1523 09/12/23 1529 09/12/23 1545  CREATININE 2.22*  --  2.00*  LATICACIDVEN  --  >15.0*  --     CrCl cannot be calculated (Unknown ideal weight.).    Allergies  Allergen Reactions   Azithromycin Nausea And Vomiting   Penicillins Nausea And Vomiting    Antimicrobials this admission: Cefepime & flagyl given x1 in ED 3/1 vancomycin 3/1 >>  Zosyn 3/2 >>   Microbiology results: Pending  Thank you for allowing pharmacy to be a part of this patient's care.  Delmar Landau, PharmD, BCPS 09/12/2023 5:36 PM ED Clinical Pharmacist -  [phone]

## 2023-09-12 NOTE — Progress Notes (Signed)
 eLink Physician-Brief Progress Note Patient Name: Jessica Bruce DOB: May 30, 1923 MRN: [id number]   Date of Service  09/12/2023  HPI/Events of Note  Worsening hypotension while on 10 mic/min levophed. Called and spoke with daughter Carney Bern at [phone]. Daughter was aware of her mother's condition and had questions about next steps. I explained about the dose of pressor running through her peripheral line and challenges to increase beyond a certain level without a CVC and art line. Carney Bern had talked to her other sister who is in [place] and they do not want CVC or art line placed. I explained that we are giving fluids and pressors and antibiotics and hoping things turn around but with her advanced age and current condition, we are not sure if that will happen. There is no guarantee but we hope it works. They agree and we are going to try and increase some via PIV and see if that helps and if it does not, or if the patient develops distress of any kind, will move to comfort measures. They want to give her a chance but it is clear they would not want her to suffer. Carney Bern Is at her mom's apartment by herself and would like updates if things change. Family hopes to visit in the AM even from out of state.   eICU Interventions  As above and dw RN      Intervention Category Major Interventions: Shock - evaluation and management  Solae Norling G Aldina Porta 09/12/2023, 8:57 PM  Addendum - Patient had Bps in 60s while on 15 mic of levophed and was not responding at all. Not pulling ant VT on BIPAP and looked very uncomfortable. RN had updated the daughter again and I had already spoken with her earlier and told her she will likely not make it through the night (they were hoping family could visit in AM). I asked RN to remove the Bipap since she was really uncomfortable on it and goal was to keep her pain / discomfort free while doing what we can.

## 2023-09-12 NOTE — Progress Notes (Signed)
 Pt transported from ED26 to 3M03 by RN and RT w/o complications

## 2023-09-12 NOTE — ED Provider Notes (Signed)
 [place] EMERGENCY DEPARTMENT AT [facility] Provider Note   CSN: [id number] Arrival date & time: 09/12/23  1410     History  Chief Complaint  Patient presents with   Loss of Consciousness   Hypotension    Jessica Bruce is a 89 y.o. female.  Pt from facility via EMS was noted to have respiratory distress, and low blood pressure. Patient not verbally responsive - level 5 caveat.  Pt noted to be febrile on rectal temp in ED. EMS notes bp 80/40.  Patient has MOST form specifying DNI/no CPR, but meds, fluids and comfort measures desired.   The history is provided by the patient, medical records, the nursing home and the EMS personnel. The history is limited by the condition of the patient.  Loss of Consciousness      Home Medications Prior to Admission medications   Medication Sig Start Date End Date Taking? Authorizing Provider  acetaminophen (TYLENOL) 325 MG tablet Take 1-2 tablets (325-650 mg total) by mouth every 4 (four) hours as needed for mild pain. Patient taking differently: Take 325 mg by mouth See admin instructions. Take 325 mg by mouth at bedtime and an additional 325 mg up to five times a day as needed for pain 06/16/18   Love, Pamela S, PA-C  acetaminophen (TYLENOL) 325 MG tablet Take 2 tablets (650 mg total) by mouth every 6 (six) hours as needed for up to 30 doses for mild pain or fever. 11/18/19   Terald Sleeper, MD  amLODipine (NORVASC) 10 MG tablet Take 1 tablet (10 mg total) by mouth daily with supper. Patient taking differently: Take 10 mg by mouth at bedtime.  06/25/18   Love, Evlyn Kanner, PA-C  ascorbic acid (VITAMIN C) 500 MG tablet Take 500 mg by mouth daily with breakfast.     [provider]  aspirin EC 325 MG EC tablet Take 1 tablet (325 mg total) by mouth daily with breakfast. Patient not taking: Reported on 11/18/2019 06/15/18   Kathryne Hitch, MD  calcium carbonate (TUMS - DOSED IN MG ELEMENTAL CALCIUM) 500 MG chewable tablet  Chew 2 tablets (400 mg of elemental calcium total) by mouth daily. Patient not taking: Reported on 11/18/2019 06/16/18   Almon Hercules, MD  carvedilol (COREG) 3.125 MG tablet Take 1 tablet (3.125 mg total) by mouth 2 (two) times daily with a meal. 06/25/18   Love, Evlyn Kanner, PA-C  Cholecalciferol (VITAMIN D-3 PO) Take 1 capsule by mouth daily with breakfast.    [provider]  docusate sodium (COLACE) 100 MG capsule Take 1 capsule (100 mg total) by mouth 2 (two) times daily. Patient taking differently: Take 100 mg by mouth 2 (two) times daily as needed for mild constipation.  06/25/18   Love, Evlyn Kanner, PA-C  Ensure (ENSURE) Take 237 mLs by mouth 2 (two) times daily between meals.    [provider]  escitalopram (LEXAPRO) 5 MG tablet Take 1 tablet (5 mg total) by mouth at bedtime. Patient not taking: Reported on 11/18/2019 06/25/18   Love, Evlyn Kanner, PA-C  feeding supplement (ENSURE SURGERY) LIQD Take 237 mLs by mouth daily. Patient not taking: Reported on 11/18/2019 06/15/18   Almon Hercules, MD  hydrALAZINE (APRESOLINE) 10 MG tablet Take 1 tablet (10 mg total) by mouth 3 (three) times daily. Patient taking differently: Take 10 mg by mouth in the morning and at bedtime.  06/25/18   Love, Evlyn Kanner, PA-C  losartan-hydrochlorothiazide (HYZAAR) 100-25 MG tablet  Take 1 tablet by mouth in the morning.  10/18/19   [provider]  pantoprazole (PROTONIX) 40 MG tablet Take 1 tablet (40 mg total) by mouth daily. Patient not taking: Reported on 11/18/2019 06/25/18   Jacquelynn Cree, PA-C      Allergies    Azithromycin and Penicillins    Review of Systems   Review of Systems  Unable to perform ROS: Patient unresponsive  Cardiovascular:  Positive for syncope.    Physical Exam Updated Vital Signs BP (!) 79/62   Pulse 93   Temp (!) 102.7 F (39.3 C) (Rectal)   Resp (!) 38   SpO2 100%  Physical Exam Vitals and nursing note reviewed.  Constitutional:      Appearance: She is  well-developed.     Comments: Frail appearing, in resp distress, hypoxic on room air. On o2 mask.   HENT:     Head: Atraumatic.     Nose: Nose normal.     Mouth/Throat:     Mouth: Mucous membranes are moist.  Eyes:     General: No scleral icterus.    Conjunctiva/sclera: Conjunctivae normal.     Pupils: Pupils are equal, round, and reactive to light.  Neck:     Vascular: No carotid bruit.     Trachea: No tracheal deviation.     Comments: Trachea midline. Thyroid not grossly enlarged or tender. No bruits. No stiffness or rigidity.  Cardiovascular:     Rate and Rhythm: Normal rate and regular rhythm.     Pulses: Normal pulses.     Heart sounds: Normal heart sounds. No murmur heard.    No friction rub. No gallop.  Pulmonary:     Effort: Respiratory distress present.     Breath sounds: Normal breath sounds.  Abdominal:     General: Bowel sounds are normal. There is no distension.     Palpations: Abdomen is soft.     Tenderness: There is abdominal tenderness. There is no guarding.     Comments: General abd tenderness.   Genitourinary:    Comments: No cva tenderness.  Musculoskeletal:        General: No swelling or tenderness.     Cervical back: Normal range of motion and neck supple. No rigidity. No muscular tenderness.     Right lower leg: No edema.     Left lower leg: No edema.  Lymphadenopathy:     Cervical: No cervical adenopathy.  Skin:    General: Skin is warm and dry.     Findings: No rash.  Neurological:     Comments: Moves bil extremities purposefully.   Psychiatric:        Mood and Affect: Mood normal.     ED Results / Procedures / Treatments   Labs (all labs ordered are listed, but only abnormal results are displayed) Results for orders placed or performed during the hospital encounter of 09/12/23  CBG monitoring, ED   Collection Time: 09/12/23  2:19 PM  Result Value Ref Range   Glucose-Capillary 279 (H) 70 - 99 mg/dL  Comprehensive metabolic panel    Collection Time: 09/12/23  3:23 PM  Result Value Ref Range   Sodium 146 (H) 135 - 145 mmol/L   Potassium 3.3 (L) 3.5 - 5.1 mmol/L   Chloride 104 98 - 111 mmol/L   CO2 14 (L) 22 - 32 mmol/L   Glucose, Bld 286 (H) 70 - 99 mg/dL   BUN 35 (H) 8 - 23 mg/dL   Creatinine,  Ser 2.22 (H) 0.44 - 1.00 mg/dL   Calcium 9.4 8.9 - 16.1 mg/dL   Total Protein 4.6 (L) 6.5 - 8.1 g/dL   Albumin 2.6 (L) 3.5 - 5.0 g/dL   AST 49 (H) 15 - 41 U/L   ALT 21 0 - 44 U/L   Alkaline Phosphatase 41 38 - 126 U/L   Total Bilirubin 0.9 0.0 - 1.2 mg/dL   GFR, Estimated 20 (L) >60 mL/min   Anion gap 28 (H) 5 - 15  Lipase, blood   Collection Time: 09/12/23  3:23 PM  Result Value Ref Range   Lipase 218 (H) 11 - 51 U/L  I-Stat Lactic Acid, ED   Collection Time: 09/12/23  3:29 PM  Result Value Ref Range   Lactic Acid, Venous >15.0 (HH) 0.5 - 1.9 mmol/L   Comment NOTIFIED PHYSICIAN   I-Stat venous blood gas, ([facility], [facility], [facility])   Collection Time: 09/12/23  3:37 PM  Result Value Ref Range   pH, Ven 7.020 (LL) 7.25 - 7.43   pCO2, Ven 49.7 44 - 60 mmHg   pO2, Ven 98 (H) 32 - 45 mmHg   Bicarbonate 12.8 (L) 20.0 - 28.0 mmol/L   TCO2 14 (L) 22 - 32 mmol/L   O2 Saturation 93 %   Acid-base deficit 18.0 (H) 0.0 - 2.0 mmol/L   Sodium 141 135 - 145 mmol/L   Potassium 2.8 (L) 3.5 - 5.1 mmol/L   Calcium, Ion 1.16 1.15 - 1.40 mmol/L   HCT 40.0 36.0 - 46.0 %   Hemoglobin 13.6 12.0 - 15.0 g/dL   Sample type VENOUS    Comment NOTIFIED PHYSICIAN   I-stat chem 8, ED   Collection Time: 09/12/23  3:45 PM  Result Value Ref Range   Sodium 140 135 - 145 mmol/L   Potassium 2.8 (L) 3.5 - 5.1 mmol/L   Chloride 104 98 - 111 mmol/L   BUN 31 (H) 8 - 23 mg/dL   Creatinine, Ser 0.96 (H) 0.44 - 1.00 mg/dL   Glucose, Bld 045 (H) 70 - 99 mg/dL   Calcium, Ion 4.09 8.11 - 1.40 mmol/L   TCO2 15 (L) 22 - 32 mmol/L   Hemoglobin 13.3 12.0 - 15.0 g/dL   HCT 91.4 78.2 - 95.6 %      EKG EKG Interpretation Date/Time:  Saturday September 12 2023  14:18:10 EST Ventricular Rate:  110 PR Interval:  137 QRS Duration:  75 QT Interval:  364 QTC Calculation: 497 R Axis:   -39  Text Interpretation: Sinus tachycardia Baseline wander Confirmed by Cathren Laine ([id number]) on 09/12/2023 3:13:01 PM  Radiology DG Chest Port 1 View Result Date: 09/12/2023 CLINICAL DATA:  Questionable sepsis - evaluate for abnormality Found unresponsive with bradycardia and hypotension. EXAM: PORTABLE CHEST 1 VIEW COMPARISON:  Radiographs 11/18/2019 and 03/15/2019. FINDINGS: 1447 hours. The heart size and mediastinal contours are stable with aortic atherosclerosis. Mild atelectasis or scarring at both lung bases, similar to previous studies. No confluent airspace disease, edema, pleural effusion or pneumothorax. Old right clavicle and rib fractures are grossly stable. No acute osseous findings are seen. There are surgical clips at the thoracic inlet. IMPRESSION: No evidence of acute cardiopulmonary process. Mild bibasilar atelectasis or scarring. Electronically Signed   By: Carey Bullocks M.D.   On: 09/12/2023 15:58    Procedures Procedures    Medications Ordered in ED Medications  lactated ringers infusion (has no administration in time range)  metroNIDAZOLE (FLAGYL) IVPB 500 mg (has no administration in  time range)  vancomycin (VANCOCIN) IVPB 1000 mg/200 mL premix (1,000 mg Intravenous New Bag/Given 09/12/23 1542)  sodium bicarbonate injection 50 mEq (has no administration in time range)  potassium chloride 10 mEq in 100 mL IVPB (has no administration in time range)  lactated ringers bolus 1,000 mL (0 mLs Intravenous Stopped 09/12/23 1557)    And  lactated ringers bolus 1,000 mL (0 mLs Intravenous Stopped 09/12/23 1557)  ceFEPIme (MAXIPIME) 2 g in sodium chloride 0.9 % 100 mL IVPB (0 g Intravenous Stopped 09/12/23 1541)  acetaminophen (TYLENOL) suppository 650 mg (650 mg Rectal Given 09/12/23 1538)    ED Course/ Medical Decision Making/ A&P                                  Medical Decision Making Problems Addressed: Abdominal pain, generalized: acute illness or injury with systemic symptoms that poses a threat to life or bodily functions Acute febrile illness: acute illness or injury with systemic symptoms that poses a threat to life or bodily functions Acute respiratory failure with hypoxia (HCC): acute illness or injury with systemic symptoms that poses a threat to life or bodily functions AKI (acute kidney injury) (HCC): acute illness or injury Elevated lactic acid level: acute illness or injury with systemic symptoms that poses a threat to life or bodily functions Hypokalemia: acute illness or injury  Amount and/or Complexity of Data Reviewed Independent Historian: EMS    Details: hx External Data Reviewed: notes. Labs: ordered. Decision-making details documented in ED Course. Radiology: ordered and independent interpretation performed. Decision-making details documented in ED Course. ECG/medicine tests: ordered and independent interpretation performed. Decision-making details documented in ED Course.  Risk OTC drugs. Prescription drug management. Decision regarding hospitalization.   Iv ns. Continuous pulse ox and cardiac monitoring. Labs ordered/sent. Imaging ordered.   Differential diagnosis includes sepsis, pna, arf, etc. Dispo decision including potential need for admission considered - will get labs and imaging and reassess.   Reviewed nursing notes and prior charts for additional history. External reports reviewed. Additional history from: EMS.   Cardiac monitor: sinus rhythm, rate 60.  LR boluses 30 cc/kg. Cultures sent. Iv abx. Stat labs.   Labs reviewed/interpreted by me - lactate very high. Hco3 low. Ivf resuscitation. K low. Mg added. Kcl iv.   Xrays reviewed/interpreted by me - no ptx.   CT reviewed/interpreted by me - pnd.   1600, additional labs and cts remain pending.   Family arrival pending re clarification of goals  of care given severe illness, ?possible transition to comfort care vs continued aggressive resuscitation.   Additional ivfs ordered.   CRITICAL CARE RE: acute respiratory failure with hypoxia, aki, hypokalemia, abd pain r/o emergent abd process, severe sepsis, hypotension, severely elevated lactic acid Performed by: Suzi Roots Total critical care time: 110 minutes Critical care time was exclusive of separately billable procedures and treating other patients. Critical care was necessary to treat or prevent imminent or life-threatening deterioration. Critical care was time spent personally by me on the following activities: development of treatment plan with patient and/or surrogate as well as nursing, discussions with consultants, evaluation of patient's response to treatment, examination of patient, obtaining history from patient or surrogate, ordering and performing treatments and interventions, ordering and review of laboratory studies, ordering and review of radiographic studies, pulse oximetry and re-evaluation of patient's condition.   1605, signed out to Dr Silverio Lay to continue resuscitation, discuss w family when  arrive, follow up on pending labs and imaging, facilitate admission.             Final Clinical Impression(s) / ED Diagnoses Final diagnoses:  Acute febrile illness  Acute respiratory failure with hypoxia (HCC)  AKI (acute kidney injury) (HCC)  Elevated lactic acid level  Hypokalemia  Abdominal pain, generalized    Rx / DC Orders ED Discharge Orders     None         Cathren Laine, MD 09/12/23 1605

## 2023-09-12 NOTE — H&P (Signed)
 NAME:  Jessica Bruce, MRN:  [id number], DOB:  [birth year], LOS: 0 ADMISSION DATE:  09/12/2023, CONSULTATION DATE:  09/12/2023 REFERRING MD:  Dr. Silverio Lay , CHIEF COMPLAINT:  Septic shock    History of Present Illness:  Jessica Bruce is a 89 y.o. female with a past medical history significant for HTN, HLD, anxiety, and osteoporosis who presented to the ED 3/1 from local assisted living facility with altered mental status and hypotension.  On ED arrival patient was seen febrile with Tmax 102.7, tachypneic, tachycardic, and hypotensive.  Lab work significant for NA 146, K3.3, glucose 86, creatinine 2.22, GFR 20, lipase 218, AST 49, corrected greater than 15.  CBC pending at time of assessment.  Imaging also pending at time of assessment including CT chest abdomen pelvis  Pertinent  Medical History  HTN, HLD, anxiety, and osteoporosis  Significant Hospital Events: Including procedures, antibiotic start and stop dates in addition to other pertinent events   3/1 presented from local assisted living with altered mental status and hypotension in the setting of septic shock  Interim History / Subjective:  Tolerating BIPAP   Objective   Blood pressure (!) 71/52, pulse 95, temperature (!) 96 F (35.6 C), resp. rate (!) 27, SpO2 99%.        Intake/Output Summary (Last 24 hours) at 09/12/2023 1716 Last data filed at 09/12/2023 1641 Gross per 24 hour  Intake 2300 ml  Output --  Net 2300 ml   There were no vitals filed for this visit.  Examination: General: Acute on chronic ill-appearing deconditioned elderly female lying in bed on BiPAP in no acute distress HEENT: BiPAP mask in place, MM pink/moist, PERRL,  Neuro: Alert and interactive, follows simple commands CV: s1s2 regular rate and rhythm, no murmur, rubs, or gallops,  PULM: Bilateral rhonchi, diminished, tolerating BiPAP, no increased work of breathing GI: soft, bowel sounds active in all 4 quadrants, non-tender, distended Extremities:  warm/dry, no edema  Skin: no rashes or lesions  Resolved Hospital Problem list     Assessment & Plan:  Septic shock  -Presented febrile with Tmax 102.7, tachypneic, tachycardic, and hypotensive with lactic acid greater than 15.  Chest x-ray relatively unremarkable patient is currently BiPAP dependent.  Concern for pneumonia versus GI source with elevated lipase  P: Admit ICU Continue BIPAP Pan cultures prior to antibiotic Continue IV vanc and Zosyn  Aggressive IV hydration provided on admit Continue peripheral pressors for MAP goal< 65  Trend lactic acid Procalcitonin Monitor urine output Check CK   Acute Kidney Injury  -Creatinine 2.22 with GRF 20 on admission compared to creatinine 0.96 with GFR 51 11/2019 Anion gap metabolic acidosis  P: Follow renal function  Bicarb drip  Monitor urine output Trend Bmet Avoid nephrotoxins Ensure adequate renal perfusion  IV hydration  Acute Hypoxic Respiratory Failure  -Concern for pneumonia  P: Continue BIPAP can trial HHFNC on arrival to ICU  Head of bed elevated 30 degrees. Follow intermittent chest x-ray and ABG. Ensure adequate pulmonary hygiene  Follow cultures  Aspiration precautions   Acute metabolic encephalopathy in the setting of sepsis   P: Maintain neuro protective measures Nutrition and bowel regiment  Aspirations precautions  Minimize sedation   Hypernatremia  Hypokalemia  P: IV hydration  Supplement K   Goals of care  Family would like to continue current intervention with hope of recovery but confirm patient is a DNR/DNI and would not want invasive interventions such as central access or A-line.   Best Practice (  right click and "Reselect all SmartList Selections" daily)   Diet/type: NPO DVT prophylaxis prophylactic heparin  Pressure ulcer(s): N/A GI prophylaxis: PPI Lines: N/A Foley:  Yes, and it is still needed Code Status:  DNR Last date of multidisciplinary goals of care discussion: Continue  to update patient and family daily   Labs   CBC: Recent Labs  Lab 09/12/23 1537 09/12/23 1545  HGB 13.6 13.3  HCT 40.0 39.0    Basic Metabolic Panel: Recent Labs  Lab 09/12/23 1523 09/12/23 1537 09/12/23 1545  NA 146* 141 140  K 3.3* 2.8* 2.8*  CL 104  --  104  CO2 14*  --   --   GLUCOSE 286*  --  242*  BUN 35*  --  31*  CREATININE 2.22*  --  2.00*  CALCIUM 9.4  --   --    GFR: CrCl cannot be calculated (Unknown ideal weight.). Recent Labs  Lab 09/12/23 1529  LATICACIDVEN >15.0*    Liver Function Tests: Recent Labs  Lab 09/12/23 1523  AST 49*  ALT 21  ALKPHOS 41  BILITOT 0.9  PROT 4.6*  ALBUMIN 2.6*   Recent Labs  Lab 09/12/23 1523  LIPASE 218*   No results for input(s): "AMMONIA" in the last 168 hours.  ABG    Component Value Date/Time   HCO3 12.8 (L) 09/12/2023 1537   TCO2 15 (L) 09/12/2023 1545   ACIDBASEDEF 18.0 (H) 09/12/2023 1537   O2SAT 93 09/12/2023 1537     Coagulation Profile: No results for input(s): "INR", "PROTIME" in the last 168 hours.  Cardiac Enzymes: No results for input(s): "CKTOTAL", "CKMB", "CKMBINDEX", "TROPONINI" in the last 168 hours.  HbA1C: No results found for: "HGBA1C"  CBG: Recent Labs  Lab 09/12/23 1419  GLUCAP 279*    Review of Systems:   Please see the history of present illness. All other systems reviewed and are negative   Past Medical History:  She,  has a past medical history of Anxiety, Closed right hip fracture, initial encounter (HCC) (06/11/2018), Hyperglycemia, Hypertension, and Osteoporosis.   Surgical History:   Past Surgical History:  Procedure Laterality Date   ANTERIOR APPROACH HEMI HIP ARTHROPLASTY Right 06/11/2018   Procedure: Right hip hemiarthroplasty direct anterior;  Surgeon: Kathryne Hitch, MD;  Location: [facility] OR;  Service: Orthopedics;  Laterality: Right;   CHOLECYSTECTOMY     COLON SURGERY     pre-cancerous polyp very remotely     Social History:   reports that  she has never smoked. She has never used smokeless tobacco. She reports current alcohol use of about 3.0 standard drinks of alcohol per week. She reports that she does not use drugs.   Family History:  Her family history includes Cancer in her brother; Heart failure in her father; High blood pressure in her mother.   Allergies Allergies  Allergen Reactions   Azithromycin Nausea And Vomiting   Penicillins Nausea And Vomiting     Home Medications  Prior to Admission medications   Medication Sig Start Date End Date Taking? Authorizing Provider  acetaminophen (TYLENOL) 325 MG tablet Take 1-2 tablets (325-650 mg total) by mouth every 4 (four) hours as needed for mild pain. Patient taking differently: Take 325 mg by mouth See admin instructions. Take 325 mg by mouth at bedtime and an additional 325 mg up to five times a day as needed for pain 06/16/18   Love, Evlyn Kanner, PA-C  acetaminophen (TYLENOL) 325 MG tablet Take 2 tablets (650 mg total)  by mouth every 6 (six) hours as needed for up to 30 doses for mild pain or fever. 11/18/19   Terald Sleeper, MD  amLODipine (NORVASC) 10 MG tablet Take 1 tablet (10 mg total) by mouth daily with supper. Patient taking differently: Take 10 mg by mouth at bedtime.  06/25/18   Love, Evlyn Kanner, PA-C  ascorbic acid (VITAMIN C) 500 MG tablet Take 500 mg by mouth daily with breakfast.     [provider]  aspirin EC 325 MG EC tablet Take 1 tablet (325 mg total) by mouth daily with breakfast. Patient not taking: Reported on 11/18/2019 06/15/18   Kathryne Hitch, MD  calcium carbonate (TUMS - DOSED IN MG ELEMENTAL CALCIUM) 500 MG chewable tablet Chew 2 tablets (400 mg of elemental calcium total) by mouth daily. Patient not taking: Reported on 11/18/2019 06/16/18   Almon Hercules, MD  carvedilol (COREG) 3.125 MG tablet Take 1 tablet (3.125 mg total) by mouth 2 (two) times daily with a meal. 06/25/18   Love, Evlyn Kanner, PA-C  Cholecalciferol (VITAMIN D-3 PO) Take  1 capsule by mouth daily with breakfast.    [provider]  docusate sodium (COLACE) 100 MG capsule Take 1 capsule (100 mg total) by mouth 2 (two) times daily. Patient taking differently: Take 100 mg by mouth 2 (two) times daily as needed for mild constipation.  06/25/18   Love, Evlyn Kanner, PA-C  Ensure (ENSURE) Take 237 mLs by mouth 2 (two) times daily between meals.    [provider]  escitalopram (LEXAPRO) 5 MG tablet Take 1 tablet (5 mg total) by mouth at bedtime. Patient not taking: Reported on 11/18/2019 06/25/18   Love, Evlyn Kanner, PA-C  feeding supplement (ENSURE SURGERY) LIQD Take 237 mLs by mouth daily. Patient not taking: Reported on 11/18/2019 06/15/18   Almon Hercules, MD  hydrALAZINE (APRESOLINE) 10 MG tablet Take 1 tablet (10 mg total) by mouth 3 (three) times daily. Patient taking differently: Take 10 mg by mouth in the morning and at bedtime.  06/25/18   Love, Evlyn Kanner, PA-C  losartan-hydrochlorothiazide (HYZAAR) 100-25 MG tablet Take 1 tablet by mouth in the morning.  10/18/19   [provider]  pantoprazole (PROTONIX) 40 MG tablet Take 1 tablet (40 mg total) by mouth daily. Patient not taking: Reported on 11/18/2019 06/25/18   Jacquelynn Cree, PA-C     Critical care time:   CRITICAL CARE Performed by: Ashlei Chinchilla D. Harris   Total critical care time: 42 minutes  Critical care time was exclusive of separately billable procedures and treating other patients.  Critical care was necessary to treat or prevent imminent or life-threatening deterioration.  Critical care was time spent personally by me on the following activities: development of treatment plan with patient and/or surrogate as well as nursing, discussions with consultants, evaluation of patient's response to treatment, examination of patient, obtaining history from patient or surrogate, ordering and performing treatments and interventions, ordering and review of laboratory studies, ordering and review of  radiographic studies, pulse oximetry and re-evaluation of patient's condition.  Krislynn Gronau D. Harris, NP-C [facility] Personal contact information can be found on Amion  If no contact or response made please call [phone] 09/12/2023, 5:38 PM

## 2023-09-12 NOTE — Sepsis Progress Note (Signed)
 Sepsis protocol monitored by eLink ?

## 2023-09-13 LAB — BLOOD CULTURE ID PANEL (REFLEXED) - BCID2

## 2023-09-13 LAB — GLUCOSE, CAPILLARY: Glucose-Capillary: 188 mg/dL — ABNORMAL HIGH (ref 70–99)

## 2023-09-15 LAB — CULTURE, BLOOD (ROUTINE X 2)

## 2023-09-17 LAB — CULTURE, BLOOD (ROUTINE X 2): Culture: NO GROWTH

## 2023-10-13 NOTE — Death Summary Note (Signed)
  DEATH SUMMARY   Patient Details  Name: Jessica Bruce MRN: [id number] DOB: 01-14-1923  Admission/Discharge Information   Admit Date:  2023/09/25  Date of Death: Date of Death: 09-26-2023  Time of Death: Time of Death: 09-26-23  Length of Stay: 1  Referring Physician: Merlene Laughter, MD (Inactive)   Reason(s) for Hospitalization  Septic shock present on admission Acute hypoxic respiratory failure secondary to pneumonia AKI secondary to ATN Acute metabolic encephalopathy secondary to sepsis Hypernatremia present on admission Hypokalemia present on admission DNR, palliative care  Diagnoses  Preliminary cause of death:  Secondary Diagnoses (including complications and co-morbidities):  Principal Problem:   Septic shock [facility])   Brief Hospital Course (including significant findings, care, treatment, and services provided and events leading to death)   Jessica Bruce is a 89 y.o. female with a past medical history significant for HTN, HLD, anxiety, and osteoporosis who presented to the ED 3/1 from local assisted living facility with altered mental status and hypotension.  On ED arrival patient was seen febrile with Tmax 102.7, tachypneic, tachycardic, and hypotensive.  Lab work significant for NA 146, K3.3, glucose 86, creatinine 2.22, GFR 20, lipase 218, AST 49, corrected greater than 15.    She had acute kidney injury, multiorgan failure, acute metabolic encephalopathy.  Admitted to the ICU Goals of care conversation were had with family and they confirmed DNR with no escalation of care.  They do not want any invasive procedures such as central line, A-line.  She was maintained on BiPAP with pressor support, broad antibiotic coverage but continued to decline and passed away later that night.   Signature   Chilton Greathouse MD [facility] See Amion for pager  If no response to pager , please call [phone] until 7pm After 7:00 pm call Elink   [phone] 09/15/2023, 10:11 AM

## 2023-10-13 DEATH — deceased
